# Patient Record
Sex: Female | Born: 1950 | Race: White | Hispanic: No | Marital: Married | Smoking: Never smoker
Health system: Southern US, Community
[De-identification: ages and names within clinical notes are randomized; demographics above are authoritative.]

## PROBLEM LIST (undated history)

## (undated) DIAGNOSIS — J45909 Unspecified asthma, uncomplicated: Secondary | ICD-10-CM

## (undated) DIAGNOSIS — Z87440 Personal history of urinary (tract) infections: Secondary | ICD-10-CM

## (undated) DIAGNOSIS — T884XXA Failed or difficult intubation, initial encounter: Secondary | ICD-10-CM

## (undated) DIAGNOSIS — Z87448 Personal history of other diseases of urinary system: Secondary | ICD-10-CM

## (undated) DIAGNOSIS — M1990 Unspecified osteoarthritis, unspecified site: Secondary | ICD-10-CM

## (undated) DIAGNOSIS — M797 Fibromyalgia: Secondary | ICD-10-CM

## (undated) DIAGNOSIS — R112 Nausea with vomiting, unspecified: Secondary | ICD-10-CM

## (undated) DIAGNOSIS — S36039A Unspecified laceration of spleen, initial encounter: Secondary | ICD-10-CM

## (undated) DIAGNOSIS — E079 Disorder of thyroid, unspecified: Secondary | ICD-10-CM

## (undated) DIAGNOSIS — C801 Malignant (primary) neoplasm, unspecified: Secondary | ICD-10-CM

## (undated) DIAGNOSIS — I10 Essential (primary) hypertension: Secondary | ICD-10-CM

## (undated) DIAGNOSIS — J40 Bronchitis, not specified as acute or chronic: Secondary | ICD-10-CM

## (undated) DIAGNOSIS — M549 Dorsalgia, unspecified: Secondary | ICD-10-CM

## (undated) DIAGNOSIS — Z87898 Personal history of other specified conditions: Secondary | ICD-10-CM

## (undated) DIAGNOSIS — J189 Pneumonia, unspecified organism: Secondary | ICD-10-CM

## (undated) DIAGNOSIS — Z9889 Other specified postprocedural states: Secondary | ICD-10-CM

## (undated) DIAGNOSIS — G4730 Sleep apnea, unspecified: Secondary | ICD-10-CM

## (undated) DIAGNOSIS — E119 Type 2 diabetes mellitus without complications: Secondary | ICD-10-CM

## (undated) DIAGNOSIS — T8859XA Other complications of anesthesia, initial encounter: Secondary | ICD-10-CM

## (undated) DIAGNOSIS — T4145XA Adverse effect of unspecified anesthetic, initial encounter: Secondary | ICD-10-CM

## (undated) HISTORY — PX: VAGINAL HYSTERECTOMY: SUR661

## (undated) HISTORY — PX: EYE SURGERY: SHX253

## (undated) HISTORY — PX: APPENDECTOMY: SHX54

## (undated) HISTORY — PX: BREAST SURGERY: SHX581

## (undated) HISTORY — PX: KNEE ARTHROSCOPY: SHX127

## (undated) HISTORY — PX: CHOLECYSTECTOMY: SHX55

## (undated) HISTORY — PX: TONSILLECTOMY: SUR1361

## (undated) HISTORY — PX: ABDOMINAL SURGERY: SHX537

## (undated) HISTORY — PX: BACK SURGERY: SHX140

---

## 1971-09-06 HISTORY — PX: FRACTURE SURGERY: SHX138

## 1998-02-26 ENCOUNTER — Ambulatory Visit (HOSPITAL_COMMUNITY): Admission: RE | Admit: 1998-02-26 | Discharge: 1998-02-26 | Payer: Self-pay | Admitting: Internal Medicine

## 1998-03-26 ENCOUNTER — Other Ambulatory Visit: Admission: RE | Admit: 1998-03-26 | Discharge: 1998-03-26 | Payer: Self-pay | Admitting: Internal Medicine

## 1998-10-07 ENCOUNTER — Ambulatory Visit (HOSPITAL_COMMUNITY): Admission: RE | Admit: 1998-10-07 | Discharge: 1998-10-07 | Payer: Self-pay | Admitting: Orthopedic Surgery

## 1998-10-07 ENCOUNTER — Encounter: Payer: Self-pay | Admitting: Orthopedic Surgery

## 1999-01-12 ENCOUNTER — Encounter: Payer: Self-pay | Admitting: Otolaryngology

## 1999-01-12 ENCOUNTER — Ambulatory Visit (HOSPITAL_COMMUNITY): Admission: RE | Admit: 1999-01-12 | Discharge: 1999-01-12 | Payer: Self-pay | Admitting: Otolaryngology

## 1999-01-28 ENCOUNTER — Ambulatory Visit (HOSPITAL_COMMUNITY): Admission: RE | Admit: 1999-01-28 | Discharge: 1999-01-28 | Payer: Self-pay | Admitting: Otolaryngology

## 1999-02-10 ENCOUNTER — Ambulatory Visit: Admission: RE | Admit: 1999-02-10 | Discharge: 1999-02-10 | Payer: Self-pay | Admitting: Otolaryngology

## 1999-03-03 ENCOUNTER — Encounter: Admission: RE | Admit: 1999-03-03 | Discharge: 1999-05-25 | Payer: Self-pay | Admitting: Anesthesiology

## 1999-03-08 ENCOUNTER — Encounter: Payer: Self-pay | Admitting: Otolaryngology

## 1999-03-08 ENCOUNTER — Ambulatory Visit (HOSPITAL_COMMUNITY): Admission: RE | Admit: 1999-03-08 | Discharge: 1999-03-08 | Payer: Self-pay | Admitting: Otolaryngology

## 1999-11-23 ENCOUNTER — Encounter: Admission: RE | Admit: 1999-11-23 | Discharge: 1999-11-23 | Payer: Self-pay | Admitting: Orthopedic Surgery

## 1999-11-23 ENCOUNTER — Encounter: Payer: Self-pay | Admitting: Orthopedic Surgery

## 2000-02-29 ENCOUNTER — Ambulatory Visit (HOSPITAL_COMMUNITY): Admission: RE | Admit: 2000-02-29 | Discharge: 2000-02-29 | Payer: Self-pay | Admitting: Neurosurgery

## 2000-02-29 ENCOUNTER — Encounter: Payer: Self-pay | Admitting: Neurosurgery

## 2000-04-06 ENCOUNTER — Other Ambulatory Visit: Admission: RE | Admit: 2000-04-06 | Discharge: 2000-04-06 | Payer: Self-pay | Admitting: *Deleted

## 2001-03-14 ENCOUNTER — Encounter: Admission: RE | Admit: 2001-03-14 | Discharge: 2001-03-14 | Payer: Self-pay | Admitting: Orthopedic Surgery

## 2001-03-14 ENCOUNTER — Encounter: Payer: Self-pay | Admitting: Orthopedic Surgery

## 2001-04-11 ENCOUNTER — Encounter: Admission: RE | Admit: 2001-04-11 | Discharge: 2001-04-11 | Payer: Self-pay | Admitting: Internal Medicine

## 2001-04-11 ENCOUNTER — Encounter: Payer: Self-pay | Admitting: Internal Medicine

## 2001-04-12 ENCOUNTER — Encounter: Payer: Self-pay | Admitting: Internal Medicine

## 2001-04-12 ENCOUNTER — Encounter: Admission: RE | Admit: 2001-04-12 | Discharge: 2001-04-12 | Payer: Self-pay | Admitting: Internal Medicine

## 2002-03-31 ENCOUNTER — Encounter: Payer: Self-pay | Admitting: Orthopedic Surgery

## 2002-03-31 ENCOUNTER — Encounter: Admission: RE | Admit: 2002-03-31 | Discharge: 2002-03-31 | Payer: Self-pay | Admitting: Orthopedic Surgery

## 2003-08-15 ENCOUNTER — Other Ambulatory Visit: Admission: RE | Admit: 2003-08-15 | Discharge: 2003-08-15 | Payer: Self-pay | Admitting: *Deleted

## 2004-10-27 ENCOUNTER — Ambulatory Visit (HOSPITAL_COMMUNITY): Admission: RE | Admit: 2004-10-27 | Discharge: 2004-10-28 | Payer: Self-pay | Admitting: Orthopedic Surgery

## 2010-08-21 ENCOUNTER — Inpatient Hospital Stay (HOSPITAL_COMMUNITY)
Admission: EM | Admit: 2010-08-21 | Discharge: 2010-08-28 | Payer: Self-pay | Source: Home / Self Care | Attending: Internal Medicine | Admitting: Internal Medicine

## 2010-10-27 ENCOUNTER — Encounter (INDEPENDENT_AMBULATORY_CARE_PROVIDER_SITE_OTHER): Payer: Self-pay | Admitting: *Deleted

## 2010-10-29 ENCOUNTER — Encounter (INDEPENDENT_AMBULATORY_CARE_PROVIDER_SITE_OTHER): Payer: Self-pay | Admitting: *Deleted

## 2010-10-29 ENCOUNTER — Encounter: Payer: Self-pay | Admitting: Internal Medicine

## 2010-11-02 NOTE — Letter (Signed)
Summary: Miralax Instructions  [facility]  [address]   [place], [place] [address]   Phone: [phone]  Fax: [phone]       [facility] Heslop    [birth year]    MRN: [id number]       Procedure Day Dorna Bloom: Friday, 11-12-10     Arrival Time: 7:30 a.m.     Procedure Time: 8:30 a.m.     Location of Procedure:                    x  [facility] (4th Floor)    PREPARATION FOR COLONOSCOPY WITH MIRALAX  Starting 5 days prior to your procedure 11-07-10 do not eat nuts, seeds, popcorn, corn, beans, peas,  salads, or any raw vegetables.  Do not take any fiber supplements (e.g. Metamucil, Citrucel, and Benefiber). ____________________________________________________________________________________________________   THE DAY BEFORE YOUR PROCEDURE         DATE: 11-11-10  DAY: Thursday  1   Drink clear liquids the entire day-NO SOLID FOOD  2   Do not drink anything colored red or purple.  Avoid juices with pulp.  No orange juice.  3   Drink at least 64 oz. (8 glasses) of fluid/clear liquids during the day to prevent dehydration and help the prep work efficiently.  CLEAR LIQUIDS INCLUDE: Water Jello Ice Popsicles Tea (sugar ok, no milk/cream) Powdered fruit flavored drinks Coffee (sugar ok, no milk/cream) Gatorade Juice: apple, white grape, white cranberry  Lemonade Clear bullion, consomm, broth Carbonated beverages (any kind) Strained chicken noodle soup Hard Candy  4   Mix the entire bottle of Miralax with 64 oz. of Gatorade/Powerade in the morning and put in the refrigerator to chill.  5   At 3:00 pm take 2 Dulcolax/Bisacodyl tablets.  6   At 4:30 pm take one Reglan/Metoclopramide tablet.  7  Starting at 5:00 pm drink one 8 oz glass of the Miralax mixture every 15-20 minutes until you have finished drinking the entire 64 oz.  You should finish drinking prep around 7:30 or 8:00 pm.  8   If you are nauseated, you may take the 2nd Reglan/Metoclopramide  tablet at 6:30 pm.        9    At 8:00 pm take 2 more DULCOLAX/Bisacodyl tablets.     THE DAY OF YOUR PROCEDURE      DATE:  11-12-10  DAY: Friday You may drink clear liquids until 6:30 a.m.  (2 HOURS BEFORE PROCEDURE).   MEDICATION INSTRUCTIONS  Unless otherwise instructed, you should take regular prescription medications with a small sip of water as early as possible the morning of your procedure.  Diabetic patients - see separate instructions.            OTHER INSTRUCTIONS  You will need a responsible adult at least 62 years of age to accompany you and drive you home.   This person must remain in the waiting room during your procedure.  Wear loose fitting clothing that is easily removed.  Leave jewelry and other valuables at home.  However, you may wish to bring a book to read or an iPod/MP3 player to listen to music as you wait for your procedure to start.  Remove all body piercing jewelry and leave at home.  Total time from sign-in until discharge is approximately 2-3 hours.  You should go home directly after your procedure and rest.  You can resume normal activities the day after your procedure.  The day of your  procedure you should not:   Drive   Make legal decisions   Operate machinery   Drink alcohol   Return to work  You will receive specific instructions about eating, activities and medications before you leave.   The above instructions have been reviewed and explained to me by   Ezra Sites RN  October 29, 2010 9:48 AM     I fully understand and can verbalize these instructions _____________________________ Date _______

## 2010-11-02 NOTE — Miscellaneous (Signed)
Summary: [facility]  Clinical Lists Changes  Medications: Added new medication of MIRALAX   POWD (POLYETHYLENE GLYCOL 3350) As per prep  instructions. - Signed Added new medication of DULCOLAX 5 MG  TBEC (BISACODYL) Day before procedure take 2 at 3pm and 2 at 8pm. - Signed Added new medication of REGLAN 10 MG  TABS (METOCLOPRAMIDE HCL) As per prep instructions. - Signed Rx of MIRALAX   POWD (POLYETHYLENE GLYCOL 3350) As per prep  instructions.;  #255gm x 0;  Signed;  Entered by: Ezra Sites RN;  Authorized by: Hart Carwin MD;  Method used: Electronically to [facility]  [address] [id number] [address] [place], [place]  [address], Ph: [phone] or [phone], Fax: [phone] Rx of DULCOLAX 5 MG  TBEC (BISACODYL) Day before procedure take 2 at 3pm and 2 at 8pm.;  #4 x 0;  Signed;  Entered by: Ezra Sites RN;  Authorized by: Hart Carwin MD;  Method used: Electronically to [facility]  [address] [facility] [address] [place], [place]  [address], Ph: [phone] or [phone], Fax: [phone] Rx of REGLAN 10 MG  TABS (METOCLOPRAMIDE HCL) As per prep instructions.;  #2 x 0;  Signed;  Entered by: Ezra Sites RN;  Authorized by: Hart Carwin MD;  Method used: Electronically to [facility]  [address] [facility] [address] [place], [place]  [address], Ph: [phone] or [phone], Fax: [phone] Allergies: Added new allergy or adverse reaction of * ANECTINE Added new allergy or adverse reaction of VICODIN Added new allergy or adverse reaction of * BEXTRA Added new allergy or adverse reaction of PERCOCET Observations: Added new observation of NKA: F (10/29/2010 9:08)    Prescriptions: REGLAN 10 MG  TABS (METOCLOPRAMIDE HCL) As per prep instructions.  #2 x 0   Entered by:   Ezra Sites RN   Authorized by:   Hart Carwin MD   Signed by:   Ezra Sites RN on 10/29/2010   Method used:   [facility]ronically to        [facility]  [address] [facility] ([facility]ail)       [address]       [place], [place]  [address]       Ph: [phone] or [phone]       Fax: [phone]   RxID:   [id number] DULCOLAX 5 MG  TBEC (BISACODYL) Day before procedure take 2 at 3pm and 2 at 8pm.  #4 x 0   Entered by:   Ezra Sites RN   Authorized by:   Hart Carwin MD   Signed by:   Ezra Sites RN on 10/29/2010   Method used:   [facility]ronically to        [facility]  [address] [facility] (retail)       [address]       [place], [place]  [address]       Ph: [phone] or [phone]       Fax: [phone]   RxID:   [id number] MIRALAX   POWD (POLYETHYLENE GLYCOL 3350) As per prep  instructions.  #255gm x 0   Entered by:   Ezra Sites RN   Authorized by:   Hart Carwin MD   Signed by:   Ezra Sites RN on 10/29/2010   Method used:   [facility]ronically to        [facility]  [address] [facility] (  [facility])       [address]       [place], [place]  [address]       Ph: [phone] or [phone]       Fax: [phone]   RxID:   [id number]

## 2010-11-02 NOTE — Letter (Signed)
Summary: Diabetic Instructions  [facility]  [address] [place], [place] [address]   Phone: [phone]  Fax: [phone]    [facility]pe [birth year] MRN: [id number]     ORAL DIABETIC MEDICATION INSTRUCTIONS                                     Glucotrol XL, Januvia, Metformin The day before your procedure:   Take your diabetic pill as you do normally  The day of your procedure:   Do not take your diabetic pill    We will check your blood sugar levels during the admission process and again in Recovery before discharging you home  ________________________________________________________________________

## 2010-11-12 ENCOUNTER — Other Ambulatory Visit: Payer: Self-pay | Admitting: Internal Medicine

## 2010-11-12 ENCOUNTER — Other Ambulatory Visit (AMBULATORY_SURGERY_CENTER): Payer: Medicare Other | Admitting: Internal Medicine

## 2010-11-12 DIAGNOSIS — R238 Other skin changes: Secondary | ICD-10-CM

## 2010-11-12 DIAGNOSIS — K5730 Diverticulosis of large intestine without perforation or abscess without bleeding: Secondary | ICD-10-CM

## 2010-11-12 DIAGNOSIS — Z1211 Encounter for screening for malignant neoplasm of colon: Secondary | ICD-10-CM

## 2010-11-12 DIAGNOSIS — D126 Benign neoplasm of colon, unspecified: Secondary | ICD-10-CM

## 2010-11-12 LAB — GLUCOSE, CAPILLARY: Glucose-Capillary: 166 mg/dL — ABNORMAL HIGH (ref 70–99)

## 2010-11-15 LAB — URINE MICROSCOPIC-ADD ON

## 2010-11-15 LAB — PHOSPHORUS: Phosphorus: 2.7 mg/dL (ref 2.3–4.6)

## 2010-11-15 LAB — GLUCOSE, CAPILLARY
Glucose-Capillary: 108 mg/dL — ABNORMAL HIGH (ref 70–99)
Glucose-Capillary: 108 mg/dL — ABNORMAL HIGH (ref 70–99)
Glucose-Capillary: 109 mg/dL — ABNORMAL HIGH (ref 70–99)
Glucose-Capillary: 111 mg/dL — ABNORMAL HIGH (ref 70–99)
Glucose-Capillary: 114 mg/dL — ABNORMAL HIGH (ref 70–99)
Glucose-Capillary: 114 mg/dL — ABNORMAL HIGH (ref 70–99)
Glucose-Capillary: 114 mg/dL — ABNORMAL HIGH (ref 70–99)
Glucose-Capillary: 117 mg/dL — ABNORMAL HIGH (ref 70–99)
Glucose-Capillary: 119 mg/dL — ABNORMAL HIGH (ref 70–99)
Glucose-Capillary: 122 mg/dL — ABNORMAL HIGH (ref 70–99)
Glucose-Capillary: 133 mg/dL — ABNORMAL HIGH (ref 70–99)
Glucose-Capillary: 138 mg/dL — ABNORMAL HIGH (ref 70–99)
Glucose-Capillary: 145 mg/dL — ABNORMAL HIGH (ref 70–99)
Glucose-Capillary: 154 mg/dL — ABNORMAL HIGH (ref 70–99)
Glucose-Capillary: 209 mg/dL — ABNORMAL HIGH (ref 70–99)
Glucose-Capillary: 81 mg/dL (ref 70–99)
Glucose-Capillary: 90 mg/dL (ref 70–99)

## 2010-11-15 LAB — BASIC METABOLIC PANEL
BUN: 9 mg/dL (ref 6–23)
CO2: 29 mEq/L (ref 19–32)
CO2: 29 mEq/L (ref 19–32)
Calcium: 8.7 mg/dL (ref 8.4–10.5)
Calcium: 9 mg/dL (ref 8.4–10.5)
Calcium: 9.1 mg/dL (ref 8.4–10.5)
Calcium: 9.4 mg/dL (ref 8.4–10.5)
Chloride: 98 mEq/L (ref 96–112)
Chloride: 98 mEq/L (ref 96–112)
Creatinine, Ser: 0.81 mg/dL (ref 0.4–1.2)
Creatinine, Ser: 0.88 mg/dL (ref 0.4–1.2)
Creatinine, Ser: 0.92 mg/dL (ref 0.4–1.2)
Creatinine, Ser: 0.93 mg/dL (ref 0.4–1.2)
Creatinine, Ser: 1.03 mg/dL (ref 0.4–1.2)
GFR calc Af Amer: >60 mL/min (ref >60)
GFR calc Af Amer: >60 mL/min (ref >60)
GFR calc Af Amer: >60 mL/min (ref >60)
GFR calc Af Amer: >60 mL/min (ref >60)
GFR calc Af Amer: >60 mL/min (ref >60)
GFR calc non Af Amer: 54 mL/min — ABNORMAL LOW (ref >60)
GFR calc non Af Amer: >60 mL/min (ref >60)
GFR calc non Af Amer: >60 mL/min (ref >60)
Potassium: 4.4 mEq/L (ref 3.5–5.1)
Potassium: 5.1 mEq/L (ref 3.5–5.1)
Sodium: 135 mEq/L (ref 135–145)
Sodium: 135 mEq/L (ref 135–145)

## 2010-11-15 LAB — LIPID PANEL
Cholesterol: 115 mg/dL (ref 0–200)
HDL: 53 mg/dL (ref >39)
LDL Cholesterol: 48 mg/dL (ref 0–99)
Total CHOL/HDL Ratio: 2.2 RATIO
Triglycerides: 71 mg/dL (ref <150)
VLDL: 14 mg/dL (ref 0–40)

## 2010-11-15 LAB — CBC
HCT: 35 % — ABNORMAL LOW (ref 36.0–46.0)
HCT: 36.5 % (ref 36.0–46.0)
Hemoglobin: 11 g/dL — ABNORMAL LOW (ref 12.0–15.0)
Hemoglobin: 12.3 g/dL (ref 12.0–15.0)
MCH: 30.6 pg (ref 26.0–34.0)
MCHC: 31.4 g/dL (ref 30.0–36.0)
MCHC: 31.4 g/dL (ref 30.0–36.0)
MCHC: 31.5 g/dL (ref 30.0–36.0)
MCV: 96.9 fL (ref 78.0–100.0)
Platelets: 178 10*3/uL (ref 150–400)
Platelets: 191 10*3/uL (ref 150–400)
Platelets: 208 10*3/uL (ref 150–400)
Platelets: 212 10*3/uL (ref 150–400)
RBC: 3.46 MIL/uL — ABNORMAL LOW (ref 3.87–5.11)
RBC: 3.58 MIL/uL — ABNORMAL LOW (ref 3.87–5.11)
RBC: 3.64 MIL/uL — ABNORMAL LOW (ref 3.87–5.11)
RBC: 3.91 MIL/uL (ref 3.87–5.11)
RBC: 3.92 MIL/uL (ref 3.87–5.11)
WBC: 11.7 10*3/uL — ABNORMAL HIGH (ref 4.0–10.5)
WBC: 14.9 10*3/uL — ABNORMAL HIGH (ref 4.0–10.5)
WBC: 15.9 10*3/uL — ABNORMAL HIGH (ref 4.0–10.5)
WBC: 6.3 10*3/uL (ref 4.0–10.5)
WBC: 6.7 10*3/uL (ref 4.0–10.5)

## 2010-11-15 LAB — WOUND CULTURE

## 2010-11-15 LAB — URINALYSIS, ROUTINE W REFLEX MICROSCOPIC
Glucose, UA: 100 mg/dL — AB
Ketones, ur: NEGATIVE mg/dL
Nitrite: NEGATIVE
Specific Gravity, Urine: 1.039 — ABNORMAL HIGH (ref 1.005–1.030)
pH: 5.5 (ref 5.0–8.0)

## 2010-11-15 LAB — DIFFERENTIAL
Basophils Absolute: 0 10*3/uL (ref 0.0–0.1)
Basophils Relative: 0 % (ref 0–1)
Eosinophils Absolute: 0.1 10*3/uL (ref 0.0–0.7)
Eosinophils Relative: 0 % (ref 0–5)
Lymphocytes Relative: 11 % — ABNORMAL LOW (ref 12–46)
Lymphs Abs: 1.8 10*3/uL (ref 0.7–4.0)
Monocytes Absolute: 1.5 10*3/uL — ABNORMAL HIGH (ref 0.1–1.0)
Monocytes Relative: 10 % (ref 3–12)
Neutro Abs: 12.5 10*3/uL — ABNORMAL HIGH (ref 1.7–7.7)
Neutrophils Relative %: 79 % — ABNORMAL HIGH (ref 43–77)

## 2010-11-15 LAB — COMPREHENSIVE METABOLIC PANEL
CO2: 26 mEq/L (ref 19–32)
Calcium: 8.6 mg/dL (ref 8.4–10.5)
Chloride: 96 mEq/L (ref 96–112)
Creatinine, Ser: 0.78 mg/dL (ref 0.4–1.2)
GFR calc non Af Amer: >60 mL/min (ref >60)
Glucose, Bld: 150 mg/dL — ABNORMAL HIGH (ref 70–99)
Total Bilirubin: 0.9 mg/dL (ref 0.3–1.2)

## 2010-11-15 LAB — HEMOGLOBIN A1C: Hgb A1c MFr Bld: 8.6 % — ABNORMAL HIGH (ref <5.7)

## 2010-11-15 LAB — VANCOMYCIN, RANDOM: Vancomycin Rm: 10.5 ug/mL

## 2010-11-15 LAB — MAGNESIUM: Magnesium: 1.6 mg/dL (ref 1.5–2.5)

## 2010-11-15 LAB — VANCOMYCIN, TROUGH: Vancomycin Tr: 32 ug/mL (ref 10.0–20.0)

## 2010-11-15 LAB — URINE CULTURE: Colony Count: NO GROWTH

## 2010-11-16 NOTE — Procedures (Addendum)
Summary: Colonoscopy  Patient: [name] Note: All result statuses are Final unless otherwise noted.  Tests: (1) Colonoscopy (COL)   COL Colonoscopy           DONE     [facility]     [address]     [place], [place]  [address]          COLONOSCOPY PROCEDURE REPORT          PATIENT:  Gina Bender, Gina Bender  MR#:  [id number]     BIRTHDATE:  Jul 03, 1951, 59 yrs. old  GENDER:  female     ENDOSCOPIST:  Hedwig Morton. Juanda Chance, MD     REF. BY:  Halford Chessman, M.D.     PROCEDURE DATE:  11/12/2010     PROCEDURE:  Colonoscopy 75[address]     ASA CLASS:  Class II     INDICATIONS:  Elevated Risk Screening father with colon     cancer/polyps     last colon 1996- hyperplastic polyp     MEDICATIONS:   Versed 5 mg, Fentanyl 50 mcg          DESCRIPTION OF PROCEDURE:   After the risks benefits and     alternatives of the procedure were thoroughly explained, informed     consent was obtained.  No rectal exam performed. The LB CF-[id number]     [id number] endoscope was introduced through the anus and advanced to     the cecum, which was identified by both the appendix and ileocecal     valve, without limitations.  The quality of the prep was Miralax     fair.  The instrument was then slowly withdrawn as the colon was     fully examined.     <<PROCEDUREIMAGES>>          FINDINGS:  A sessile polyp was found. at 20 cm, 5 mm polyp Polyp     was snared without cautery. Retrieval was successful (see image2     and image1). snare polyp  Mild diverticulosis was found in the     sigmoid colon (see image9).  This was otherwise a normal     examination of the colon (see image3, image4, image5, image6, and     image10).   Retroflexed views in the rectum revealed no     abnormalities.    The scope was then withdrawn from the patient     and the procedure completed.          COMPLICATIONS:  None     ENDOSCOPIC IMPRESSION:     1) Sessile polyp     2) Mild diverticulosis in the sigmoid colon     3)  Otherwise normal examination     RECOMMENDATIONS:     1) Await pathology results     2) High fiber diet.     REPEAT EXAM:  In 5 year(s) for.          ______________________________     Hedwig Morton. Juanda Chance, MD          CC:          n.     eSIGNED:   Hedwig Morton. Jonnae Fonseca at 11/12/2010 08:39 AM          Girtha Hake, [id number]  Note: An exclamation mark (!) indicates a result that was not dispersed into the flowsheet. Document Creation Date: 11/12/2010 8:39 AM _______________________________________________________________________  (1) Order result status: Final Collection or observation date-time: 11/12/2010 08:30 Requested date-time:  Receipt date-time:  Reported date-time:  Referring Physician:   Ordering Physician: Lina Sar [id number]) Specimen Source:  Source: Launa Grill Order Number: [id number] Lab site:   Appended Document: Colonoscopy     Procedures Next Due Date:    Colonoscopy: 11/2015

## 2010-11-20 ENCOUNTER — Encounter: Payer: Self-pay | Admitting: Internal Medicine

## 2010-11-23 NOTE — Letter (Addendum)
Summary: Patient Notice- Polyp Results  [facility]  [address] [place], [place] [address]   Phone: [phone]  Fax: [phone]        November 20, 2010 MRN: [id number]    [facility] [address] [place], [place]  [address]    Dear Gina Bender,  The Pathology report  shows the polyp to be a food particle.The polyp which was removed was not present in the specimen container.  I recommend you have a repeat colonoscopy examination in 5_ years to look for recurrent polyps, as having colon polyps increases your risk for having recurrent polyps or even colon cancer in the future.  Should you develop new or worsening symptoms of abdominal pain, bowel habit changes or bleeding from the rectum or bowels, please schedule an evaluation with either your primary care physician or with me.  Additional information/recommendations:  _x_ No further action with gastroenterology is needed at this time. Please      follow-up with your primary care physician for your other healthcare      needs.  __ Please call [phone] to schedule a return visit to review your      situation.  __ Please keep your follow-up visit as already scheduled.  __ Continue treatment plan as outlined the day of your exam.  Please call us if you are having persistent problems or have questions about your condition that have not been fully answered at this time.  Sincerely,  Hart Carwin MD  This letter has been electronically signed by your physician.  Appended Document: Patient Notice- Polyp Results letter mailed

## 2011-01-21 NOTE — Op Note (Signed)
[name], Gina Bender             ACCOUNT NO.:  [id number]   MEDICAL RECORD NO.:  [id number]          PATIENT TYPE:  OIB   LOCATION:  2550                         FACILITY:  [facility]   PHYSICIAN:  Loreta Ave, M.D. DATE OF BIRTH:  July 02, 1951   DATE OF PROCEDURE:  10/27/2004  DATE OF DISCHARGE:                                 OPERATIVE REPORT   PREOPERATIVE DIAGNOSES:  Right knee medial meniscal tear with  tricompartmental degenerative joint disease, morbid obesity.   POSTOPERATIVE DIAGNOSES:  Right knee medial meniscal tear with  tricompartmental degenerative joint disease, morbid obesity.  Degenerative  tearing medial and lateral meniscus as well as tricompartmental grade 3 and  4 degenerative joint disease, most marked in medial compartment.   OPERATION PERFORMED:  Right knee examination under anesthesia, arthroscopy,  debridement of medial and lateral meniscus.  Generalized chondroplasty with  removal of chondral loose bodies and partial synovectomy.   SURGEON:  Loreta Ave, M.D.   ASSISTANT:  Genene Churn. Denton Meek.   ANESTHESIA:  General.   BLOOD LOSS:  Minimal.   TOURNIQUET:  Not employed.   SPECIMENS:  None.   CULTURES:  None.   COMPLICATIONS:  None.   DRESSING:  Soft compressive.   DESCRIPTION OF PROCEDURE:  The patient was brought to the operating room and  after adequate anesthesia had been obtained, the right knee examined.  Very  difficult because of morbid obesity.  Basically full motion, varus  alignment, stable ligaments with tibiofemoral and patellofemoral crepitus.  Tourniquet and leg holder applied.  Leg prepped and draped in the usual  sterile fashion.  Three portals were created, one superolateral, one each  medial and lateral parapatellar.  Inflow catheter introduced.  Knee  distended.  Arthroscope introduced.  Knee inspected.  Patellofemoral joint  good tracking.  Grade 3 changes on the patella debrided.  Grade 3 and some  focal grade 4 on  the trochlea debrided.  Medially, bone on bone over half  the compartment.  Chondral surfaces smoothed off.  Medial meniscus complex  tearing circumferentially.  Saucerized out to a stable rim removing all  degenerative torn and fragments that were folded over on top of the  meniscus.  Leaving a little bit of the rim all the way around.  Cruciate  ligaments intact.  Laterally, only grade 2 changes but tearing of the entire  torn lateral meniscus which was removed, tapered into remaining meniscus.  Salvaging the posterior half.  At completion, all recess examined and all  loose  fragments removed.  All hypertrophic synovitis debrided.  Instruments and  fluid removed.  Portals of knee injected with Marcaine.  Portals closed with  4-0 nylon.  Sterile compressive dressing applied.  Anesthesia reversed.  Brought to recovery room.  Tolerated surgery well.  No complications.      DFM/MEDQ  D:  10/27/2004  T:  10/27/2004  Job:  [id number]

## 2011-08-27 ENCOUNTER — Emergency Department
Admission: EM | Admit: 2011-08-27 | Discharge: 2011-08-27 | Disposition: A | Discharge status: Home / Self Care | Payer: Self-pay | Source: Home / Self Care

## 2011-08-27 NOTE — ED Provider Notes (Signed)
History     CSN: [id number]  Arrival date & time      None     No chief complaint on file.   (Consider location/radiation/quality/duration/timing/severity/associated sxs/prior treatment) HPI  No past medical history on file.  No past surgical history on file.  No family history on file.  History  Substance Use Topics  . Smoking status: Not on file  . Smokeless tobacco: Not on file  . Alcohol Use: Not on file    OB History    No data available      Review of Systems  Allergies  Hydrocodone-acetaminophen; Oxycodone-acetaminophen; and Succinylcholine  Home Medications  No current outpatient prescriptions on file.  There were no vitals taken for this visit.  Physical Exam  ED Course  Procedures (including critical care time)  Labs Reviewed - No data to display No results found.   No diagnosis found.    MDM  This appointment was put in by Error.    Lily Kocher, MD 08/27/11 [phone]

## 2011-09-18 DIAGNOSIS — G4730 Sleep apnea, unspecified: Secondary | ICD-10-CM | POA: Insufficient documentation

## 2011-09-18 DIAGNOSIS — E039 Hypothyroidism, unspecified: Secondary | ICD-10-CM | POA: Insufficient documentation

## 2011-09-18 DIAGNOSIS — I10 Essential (primary) hypertension: Secondary | ICD-10-CM | POA: Insufficient documentation

## 2011-09-18 DIAGNOSIS — M545 Low back pain, unspecified: Secondary | ICD-10-CM | POA: Insufficient documentation

## 2013-06-03 DIAGNOSIS — E119 Type 2 diabetes mellitus without complications: Secondary | ICD-10-CM | POA: Insufficient documentation

## 2014-06-02 ENCOUNTER — Encounter: Payer: Self-pay | Admitting: Internal Medicine

## 2014-08-31 ENCOUNTER — Emergency Department (HOSPITAL_COMMUNITY)
Admission: EM | Admit: 2014-08-31 | Discharge: 2014-09-01 | Disposition: A | Discharge status: Home / Self Care | Payer: Medicare Other | Attending: Emergency Medicine | Admitting: Emergency Medicine

## 2014-08-31 ENCOUNTER — Encounter (HOSPITAL_COMMUNITY): Payer: Self-pay

## 2014-08-31 DIAGNOSIS — R531 Weakness: Secondary | ICD-10-CM | POA: Insufficient documentation

## 2014-08-31 DIAGNOSIS — M797 Fibromyalgia: Secondary | ICD-10-CM | POA: Diagnosis not present

## 2014-08-31 DIAGNOSIS — J45909 Unspecified asthma, uncomplicated: Secondary | ICD-10-CM | POA: Diagnosis not present

## 2014-08-31 DIAGNOSIS — E669 Obesity, unspecified: Secondary | ICD-10-CM | POA: Diagnosis not present

## 2014-08-31 DIAGNOSIS — Z853 Personal history of malignant neoplasm of breast: Secondary | ICD-10-CM | POA: Insufficient documentation

## 2014-08-31 DIAGNOSIS — R6883 Chills (without fever): Secondary | ICD-10-CM | POA: Diagnosis not present

## 2014-08-31 DIAGNOSIS — Z794 Long term (current) use of insulin: Secondary | ICD-10-CM | POA: Insufficient documentation

## 2014-08-31 DIAGNOSIS — R29898 Other symptoms and signs involving the musculoskeletal system: Secondary | ICD-10-CM

## 2014-08-31 DIAGNOSIS — E119 Type 2 diabetes mellitus without complications: Secondary | ICD-10-CM | POA: Diagnosis not present

## 2014-08-31 DIAGNOSIS — G8929 Other chronic pain: Secondary | ICD-10-CM | POA: Diagnosis not present

## 2014-08-31 DIAGNOSIS — M1990 Unspecified osteoarthritis, unspecified site: Secondary | ICD-10-CM | POA: Diagnosis not present

## 2014-08-31 DIAGNOSIS — S22080A Wedge compression fracture of T11-T12 vertebra, initial encounter for closed fracture: Secondary | ICD-10-CM

## 2014-08-31 DIAGNOSIS — D696 Thrombocytopenia, unspecified: Secondary | ICD-10-CM | POA: Diagnosis not present

## 2014-08-31 DIAGNOSIS — Z79899 Other long term (current) drug therapy: Secondary | ICD-10-CM | POA: Insufficient documentation

## 2014-08-31 DIAGNOSIS — M545 Low back pain: Secondary | ICD-10-CM

## 2014-08-31 DIAGNOSIS — M8448XA Pathological fracture, other site, initial encounter for fracture: Secondary | ICD-10-CM | POA: Diagnosis not present

## 2014-08-31 DIAGNOSIS — I10 Essential (primary) hypertension: Secondary | ICD-10-CM | POA: Insufficient documentation

## 2014-08-31 DIAGNOSIS — E079 Disorder of thyroid, unspecified: Secondary | ICD-10-CM | POA: Diagnosis not present

## 2014-08-31 HISTORY — DX: Disorder of thyroid, unspecified: E07.9

## 2014-08-31 HISTORY — DX: Dorsalgia, unspecified: M54.9

## 2014-08-31 HISTORY — DX: Unspecified osteoarthritis, unspecified site: M19.90

## 2014-08-31 HISTORY — DX: Type 2 diabetes mellitus without complications: E11.9

## 2014-08-31 HISTORY — DX: Unspecified asthma, uncomplicated: J45.909

## 2014-08-31 HISTORY — DX: Unspecified laceration of spleen, initial encounter: S36.039A

## 2014-08-31 HISTORY — DX: Fibromyalgia: M79.7

## 2014-08-31 HISTORY — DX: Essential (primary) hypertension: I10

## 2014-08-31 HISTORY — DX: Malignant (primary) neoplasm, unspecified: C80.1

## 2014-08-31 LAB — URINALYSIS, ROUTINE W REFLEX MICROSCOPIC
Glucose, UA: NEGATIVE mg/dL
HGB URINE DIPSTICK: NEGATIVE
Ketones, ur: NEGATIVE mg/dL
LEUKOCYTES UA: NEGATIVE
NITRITE: POSITIVE — AB
Protein, ur: NEGATIVE mg/dL
Specific Gravity, Urine: 1.024 (ref 1.005–1.030)
UROBILINOGEN UA: 1 mg/dL (ref 0.0–1.0)
pH: 6 (ref 5.0–8.0)

## 2014-08-31 LAB — CBC WITH DIFFERENTIAL/PLATELET
BASOS PCT: 0 % (ref 0–1)
Basophils Absolute: 0 10*3/uL (ref 0.0–0.1)
EOS ABS: 0.2 10*3/uL (ref 0.0–0.7)
Eosinophils Relative: 3 % (ref 0–5)
HCT: 37 % (ref 36.0–46.0)
Hemoglobin: 12.2 g/dL (ref 12.0–15.0)
Lymphocytes Relative: 24 % (ref 12–46)
Lymphs Abs: 1.7 10*3/uL (ref 0.7–4.0)
MCH: 30.7 pg (ref 26.0–34.0)
MCHC: 33 g/dL (ref 30.0–36.0)
MCV: 93.2 fL (ref 78.0–100.0)
Monocytes Absolute: 0.9 10*3/uL (ref 0.1–1.0)
Monocytes Relative: 13 % — ABNORMAL HIGH (ref 3–12)
NEUTROS ABS: 4.4 10*3/uL (ref 1.7–7.7)
NEUTROS PCT: 60 % (ref 43–77)
Platelets: 130 10*3/uL — ABNORMAL LOW (ref 150–400)
RBC: 3.97 MIL/uL (ref 3.87–5.11)
RDW: 14.6 % (ref 11.5–15.5)
WBC: 7.3 10*3/uL (ref 4.0–10.5)

## 2014-08-31 LAB — URINE MICROSCOPIC-ADD ON

## 2014-08-31 LAB — BASIC METABOLIC PANEL
Anion gap: 10 (ref 5–15)
BUN: 11 mg/dL (ref 6–23)
CO2: 23 mmol/L (ref 19–32)
Calcium: 8.5 mg/dL (ref 8.4–10.5)
Chloride: 101 mEq/L (ref 96–112)
Creatinine, Ser: 0.52 mg/dL (ref 0.50–1.10)
GFR calc non Af Amer: >90 mL/min (ref >90)
Glucose, Bld: 139 mg/dL — ABNORMAL HIGH (ref 70–99)
POTASSIUM: 3.5 mmol/L (ref 3.5–5.1)
SODIUM: 134 mmol/L — AB (ref 135–145)

## 2014-08-31 MED ORDER — ONDANSETRON HCL 4 MG/2ML IJ SOLN
4.0000 mg | Freq: Once | INTRAMUSCULAR | Status: AC
  Administered 2014-08-31: 4 mg via INTRAVENOUS
  Filled 2014-08-31: qty 2

## 2014-08-31 MED ORDER — HYDROMORPHONE HCL 1 MG/ML IJ SOLN
1.0000 mg | Freq: Once | INTRAMUSCULAR | Status: AC
  Administered 2014-08-31: 1 mg via INTRAVENOUS
  Filled 2014-08-31: qty 1

## 2014-08-31 MED ORDER — CEFTRIAXONE SODIUM 1 G IJ SOLR
1.0000 g | Freq: Once | INTRAMUSCULAR | Status: AC
Start: 2014-08-31 — End: 2014-09-01
  Administered 2014-08-31: 1 g via INTRAVENOUS
  Filled 2014-08-31: qty 10

## 2014-08-31 MED ORDER — LORAZEPAM 2 MG/ML IJ SOLN
1.0000 mg | Freq: Once | INTRAMUSCULAR | Status: AC
  Administered 2014-08-31: 1 mg via INTRAVENOUS
  Filled 2014-08-31: qty 1

## 2014-08-31 MED ORDER — HYDROMORPHONE HCL 1 MG/ML IJ SOLN
0.5000 mg | Freq: Once | INTRAMUSCULAR | Status: AC
Start: 2014-08-31 — End: 2014-08-31
  Administered 2014-08-31: 0.5 mg via INTRAVENOUS
  Filled 2014-08-31: qty 1

## 2014-08-31 NOTE — ED Provider Notes (Signed)
CSN: [id number]     Arrival date & time 08/31/14  1803 History   First MD Initiated Contact with Patient 08/31/14 2001     Chief Complaint  Patient presents with  . Back Pain     (Consider location/radiation/quality/duration/timing/severity/associated sxs/prior Treatment) HPI  Gina Bender is a(n) 63 y.o. female who presents with cc of back pain. She has a pmh of htn, DM, fobormyalgia, chronic back pain and previous back surgeries performed by Dr. Harl Bowie at [facility]. The patient states that on 12/24 she was reaching forward to grab a bag when she had sudden onset, severe low back pain which she descrbes as constant, progressively worsening,worse with movment, and lying flat, nothing makes it better, non radiating. She took advil without relief. Denies weakness, loss of bowel/bladder function or saddle anesthesia. Denies neck stiffness, headache, rash.  Denies fever or recent procedures to back. She denies urinary sxs. The patient had what she believes is a GI virus on 12/23 which included nausea, vomitng, and diarrhea that lasted approximately 24 hours. She ahd associated chills and myalgias, but her sxas seem to have resolved.     Past Medical History  Diagnosis Date  . Back pain   . Arthritis   . Asthma   . Diabetes mellitus without complication   . Hypertension   . Fibromyalgia   . Thyroid disease   . Cancer     Breast  . Splenic laceration    Past Surgical History  Procedure Laterality Date  . Back surgery    . Breast surgery    . Fracture surgery    . Appendectomy    . Cholecystectomy    . Abdominal surgery    . Abdominal hysterectomy    . Tonsillectomy     No family history on file. History  Substance Use Topics  . Smoking status: Never Smoker   . Smokeless tobacco: Not on file  . Alcohol Use: No   OB History    No data available     Review of Systems Ten systems reviewed and are negative for acute change, except as noted in the HPI.     Allergies   Hydrocodone-acetaminophen; Oxycodone-acetaminophen; and Succinylcholine  Home Medications   Prior to Admission medications   Medication Sig Start Date End Date Taking? Authorizing Provider  atenolol (TENORMIN) 50 MG tablet Take 50 mg by mouth daily. 06/20/14  Yes Historical Provider, MD  cloNIDine (CATAPRES) 0.1 MG tablet Take 0.1 mg by mouth 2 (two) times daily. 06/20/14  Yes Historical Provider, MD  etodolac (LODINE) 500 MG tablet Take 500 mg by mouth at bedtime. 06/20/14  Yes Historical Provider, MD  gabapentin (NEURONTIN) 300 MG capsule Take 300 mg by mouth 2 (two) times daily.   Yes Historical Provider, MD  HUMALOG MIX 75/25 KWIKPEN (75-25) 100 UNIT/ML Kwikpen Inject 28 Units as directed daily. 07/18/14  Yes Historical Provider, MD  levothyroxine (SYNTHROID, LEVOTHROID) 175 MCG tablet Take 175 mcg by mouth daily. 06/20/14  Yes Historical Provider, MD  losartan (COZAAR) 100 MG tablet Take 100 mg by mouth daily. 06/20/14  Yes Historical Provider, MD  metFORMIN (GLUCOPHAGE) 1000 MG tablet Take 1,000 mg by mouth 2 (two) times daily. 06/20/14  Yes Historical Provider, MD  PARoxetine (PAXIL) 40 MG tablet Take 40 mg by mouth daily. 06/20/14  Yes Historical Provider, MD   BP 149/94 mmHg  Pulse 74  Temp(Src) 98.3 F (36.8 C) (Oral)  Resp 16  Ht 5\' 8"  (1.727 m)  Wt 295 lb (  133.811 kg)  BMI 44.86 kg/m2  SpO2 94% Physical Exam  Constitutional: She is oriented to person, place, and time. She appears well-developed and well-nourished. No distress.  Obese, appears uncomfortable  HENT:  Head: Normocephalic and atraumatic.  Eyes: Conjunctivae are normal. No scleral icterus.  Neck: Normal range of motion.  Cardiovascular: Normal rate, regular rhythm and normal heart sounds.  Exam reveals no gallop and no friction rub.   No murmur heard. Pulmonary/Chest: Effort normal and breath sounds normal. No respiratory distress.  Abdominal: Soft. Bowel sounds are normal. She exhibits no distension and no  mass. There is no tenderness. There is no guarding.  Neurological: She is alert and oriented to person, place, and time.  Hyporeflexive, significant weakness in the R leg with dorsiflexion, plantar flexion, hip flexion.  The patient   Skin: Skin is warm and dry. She is not diaphoretic.  Nursing note and vitals reviewed.   ED Course  Procedures (including critical care time) Labs Review Labs Reviewed  CBC WITH DIFFERENTIAL - Abnormal; Notable for the following:    Platelets 130 (*)    Monocytes Relative 13 (*)    All other components within normal limits  BASIC METABOLIC PANEL - Abnormal; Notable for the following:    Sodium 134 (*)    Glucose, Bld 139 (*)    All other components within normal limits  URINALYSIS, ROUTINE W REFLEX MICROSCOPIC    Imaging Review No results found.   EKG Interpretation None      MDM   Final diagnoses:  Right leg weakness    Patient here for back pain. Sig weakness in the R leg as compared to the left without other focal neuro deficits. Will obtain MRI to r/o cord compression.  11:54 PM BP 140/61 mmHg  Pulse 80  Temp(Src) 98.3 F (36.8 C) (Oral)  Resp 16  Ht 5\' 8"  (1.727 m)  Wt 295 lb (133.811 kg)  BMI 44.86 kg/m2  SpO2 93%  patient with UTI. MRI pending. Given IV rocephin. Ativan/ pain meds for MRI.    12:36 AM  Awaiting MRI results.  patient given in hand off to NP Tysinger.  Plan- if able to ambulate and no emergent surgical concerns, d/c with abx, pain meds and neurosurgery referral. Follow up with pcp regarding thrombocytopenia.   Margarita Mail, PA-C 09/01/14 0037  Threasa Beards, MD 09/01/14 [phone]

## 2014-08-31 NOTE — ED Notes (Signed)
Pt reports hx of back pain.  Pt reports back pain that started Thursday.  Pt states it seemed as if it would get better.  Pt states pain has worsened today to the point she can barely stand it.

## 2014-09-01 ENCOUNTER — Emergency Department (HOSPITAL_COMMUNITY): Payer: Medicare Other

## 2014-09-01 MED ORDER — CEPHALEXIN 500 MG PO CAPS: 500.0000 mg | ORAL_CAPSULE | Freq: Four times a day (QID) | ORAL | Status: DC

## 2014-09-01 MED ORDER — HYDROMORPHONE HCL 1 MG/ML IJ SOLN
1.0000 mg | Freq: Once | INTRAMUSCULAR | Status: AC
  Administered 2014-09-01: 1 mg via INTRAVENOUS
  Filled 2014-09-01: qty 1

## 2014-09-01 MED ORDER — ONDANSETRON HCL 4 MG PO TABS: 4.0000 mg | ORAL_TABLET | Freq: Four times a day (QID) | ORAL | Status: DC

## 2014-09-01 MED ORDER — OXYCODONE HCL 5 MG PO TABS: 5.0000 mg | ORAL_TABLET | ORAL | Status: DC | PRN

## 2014-09-01 NOTE — ED Notes (Signed)
Pt returned to bed. Pt being assisted with changing by family. Family asked to inform staff if assistance is needed.

## 2014-09-01 NOTE — ED Provider Notes (Signed)
12:30 AM: Received hand-off report from Margarita Mail, PA-C.  Plan includes reviewing MRI once it results and consulting Neuro if concerning findings.  Otherwise will D/C with referral to neuro.  Pt currently in MRI.  1:30 AM: Pt resting without distress, awaiting MRI results.  2:30 AM: MRI results reviewed. Discussed findings with pt. Including compression fractures with height loss of T12.  Pt still with pain but able to bear weight to stand and neuro deficit distally. Will consult neurosurgery.  3:30 AM: IV pain meds given  4:30 AM: Consulted with Dr. Kathyrn Sheriff (neurosurgery).  Recommends TLSO brace and pain meds and refer to be seen in office.  Discussed with pt. will discharge once brace is available with prescription for pain meds and antibiotic for UTI. Pt is well-appearing, in no acute distress and vital signs are stable.  They appear safe to be discharged.  Return precautions provided. Pt aware of plan and in agreement.       Britt Bottom, NP 09/01/14 [place], MD 09/04/14 [phone]

## 2014-09-01 NOTE — ED Notes (Signed)
Pt returned to room. Pt insists on waiting in wheelchair in her room for her son to return so that she can change into her new clothes before returning to bed. RN informed of pts request.

## 2014-09-01 NOTE — ED Notes (Signed)
Pt brought to bathroom by wheelchair. Pt stood and transitioned herself to the toilet. Pt given pull string/callbell. Pt informed to pull string when she needs assistance.

## 2014-09-01 NOTE — Discharge Instructions (Signed)
Please follow the directions provided.  Be sure to call the neurosurgeon's office in the am to arrange a follow-up appointment.  Use the pain medicines as directed.  You may use the nausea medicine as needed.  Use the antibiotics as directed to treat your urinary tract infection. Wear your brace as directed. Don't hesitate to return for any new, worsening or concerning symptoms.    SEEK IMMEDIATE MEDICAL CARE IF:  You have increasing pain, vomiting, or are unable to move around at all.  You develop numbness, tingling, weakness, or paralysis of any part of your body.  You develop a loss of normal bowel or bladder control.  You have difficulty breathing, cough, fever, chest or abdominal pain.

## 2014-09-18 ENCOUNTER — Other Ambulatory Visit (HOSPITAL_COMMUNITY): Payer: Self-pay | Admitting: Neurosurgery

## 2014-10-09 ENCOUNTER — Encounter (HOSPITAL_COMMUNITY): Payer: Self-pay | Admitting: *Deleted

## 2014-10-09 MED ORDER — LACTATED RINGERS IV SOLN
INTRAVENOUS | Status: DC
  Administered 2014-10-10: 08:00:00 via INTRAVENOUS

## 2014-10-09 MED ORDER — MUPIROCIN 2 % EX OINT: 1.0000 "application " | TOPICAL_OINTMENT | Freq: Once | CUTANEOUS | Status: DC

## 2014-10-09 MED ORDER — DEXTROSE 5 % IV SOLN
3.0000 g | INTRAVENOUS | Status: AC
  Administered 2014-10-10: 3 g via INTRAVENOUS
  Filled 2014-10-09: qty 3000

## 2014-10-09 NOTE — Anesthesia Preprocedure Evaluation (Addendum)
Anesthesia Evaluation  Patient identified by MRN, date of birth, ID band Patient awake    Reviewed: Allergy & Precautions, NPO status , Patient's Chart, lab work & pertinent test results  History of Anesthesia Complications (+) PONV, DIFFICULT AIRWAY and history of anesthetic complications (possible problem with succinylcholine)  Airway Mallampati: III  TM Distance: >3 FB Neck ROM: Full    Dental  (+) Chipped, Missing, Poor Dentition, Dental Advisory Given   Pulmonary asthma , sleep apnea (no CPAP) , COPD COPD inhaler,  breath sounds clear to auscultation        Cardiovascular hypertension, Pt. on medications - anginaRhythm:Regular Rate:Normal     Neuro/Psych Compression fracture Chronic back pain    GI/Hepatic negative GI ROS, Neg liver ROS,   Endo/Other  diabetes (glu 75), Oral Hypoglycemic Agents, Insulin DependentMorbid obesity  Renal/GU negative Renal ROS     Musculoskeletal  (+) Arthritis -, Fibromyalgia -  Abdominal (+) + obese,   Peds  Hematology negative hematology ROS (+)   Anesthesia Other Findings Breast cancer: surgery, XRT, chemo  Reproductive/Obstetrics                           Anesthesia Physical Anesthesia Plan  ASA: III  Anesthesia Plan: General   Post-op Pain Management:    Induction: Intravenous  Airway Management Planned: Video Laryngoscope Planned  Additional Equipment:   Intra-op Plan:   Post-operative Plan: Extubation in OR  Informed Consent: I have reviewed the patients History and Physical, chart, labs and discussed the procedure including the risks, benefits and alternatives for the proposed anesthesia with the patient or authorized representative who has indicated his/her understanding and acceptance.   Dental advisory given  Plan Discussed with: CRNA and Surgeon  Anesthesia Plan Comments: (See my note. Myra Gianotti, PA-C)        Anesthesia Quick Evaluation

## 2014-10-09 NOTE — Progress Notes (Signed)
Anesthesia Chart Review: SAME DAY WORK-UP.  Patient is a 64 year old female scheduled for T12 kyphoplasty on 10/10/13 by Dr. Kathyrn Sheriff.  History includes non-smoker, DM2, HTN, breast cancer s/p surgery (not otherwise specified),  fibromyalgia, hypothyroidism, splenic laceration, asthma, OSA, back surgery ~ 2003 [facility]; Dr. Harl Bowie), TMJ surgery '91, tonsillectomy, cholecystectomy, appendectomy, hysterectomy. BMI is consistent with morbid obesity. PCP is Angelica Pou, PA-C with [facility], records pending.  Anesthesia history by patient report to the phone interviewing RN included 1) POST-OP N/V 2) DIFFICULT INTUBATION 3) "DIED ON THE TABLE" reportedly from succinylcholine reaction during TMJ surgery in 1991.  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  - Bag/Mask ventilation was difficult (2 person with OA). - Airway was secured asleep, oral intubation using Glidescope to insert a 7.5 ETT. One attempt, direct visualization.  -Recommendations: Induction with short-acting agent and alternative techniques readily available. - Allergy to Anectine: Use low dose Zermuron, propofol.  Meds include ASA 81 (on hold), atenolol, clonidine, etodaloac, gabapentin, Humalog, levothyroxine, losartan, metformin, Ditropan, albuterol.   Anesthesia records from ~ 2003 [facility]) requested, but are still pending.    She will need labs and EKG per protocol on arrival.  Results to be reviewed by her anesthesiologist. Definitive anesthesia plan on the day of surgery.  George Hugh [facility] Short Stay Center/Anesthesiology Phone [phone] 10/09/2014 2:18 PM

## 2014-10-09 NOTE — Progress Notes (Signed)
After speaking with pt on the phone, pt informed this nurse that she "died on the table" during TMJ surgery in 1991 after receiving Succinylcholine which is listed in pt's allergies. Attempted to obtain most recent office visit from [facility], pt sees Angelica Pou, NP [phone]) but office was closed for lunch until 2pm. Chart given to [place], [place] for further review.

## 2014-10-10 ENCOUNTER — Encounter (HOSPITAL_COMMUNITY): Payer: Self-pay | Admitting: Anesthesiology

## 2014-10-10 ENCOUNTER — Ambulatory Visit (HOSPITAL_COMMUNITY)
Admission: RE | Admit: 2014-10-10 | Discharge: 2014-10-10 | Disposition: A | Discharge status: Home / Self Care | Payer: Medicare Other | Source: Ambulatory Visit | Attending: Neurosurgery | Admitting: Neurosurgery

## 2014-10-10 ENCOUNTER — Inpatient Hospital Stay (HOSPITAL_COMMUNITY): Payer: Medicare Other

## 2014-10-10 ENCOUNTER — Inpatient Hospital Stay (HOSPITAL_COMMUNITY): Payer: Medicare Other | Admitting: Vascular Surgery

## 2014-10-10 ENCOUNTER — Encounter (HOSPITAL_COMMUNITY)
Admission: RE | Disposition: A | Discharge status: Home / Self Care | Payer: Self-pay | Source: Ambulatory Visit | Attending: Neurosurgery

## 2014-10-10 DIAGNOSIS — E119 Type 2 diabetes mellitus without complications: Secondary | ICD-10-CM | POA: Diagnosis not present

## 2014-10-10 DIAGNOSIS — J45909 Unspecified asthma, uncomplicated: Secondary | ICD-10-CM | POA: Diagnosis not present

## 2014-10-10 DIAGNOSIS — Z794 Long term (current) use of insulin: Secondary | ICD-10-CM | POA: Insufficient documentation

## 2014-10-10 DIAGNOSIS — E039 Hypothyroidism, unspecified: Secondary | ICD-10-CM | POA: Insufficient documentation

## 2014-10-10 DIAGNOSIS — M47896 Other spondylosis, lumbar region: Secondary | ICD-10-CM | POA: Diagnosis not present

## 2014-10-10 DIAGNOSIS — Z79899 Other long term (current) drug therapy: Secondary | ICD-10-CM | POA: Diagnosis not present

## 2014-10-10 DIAGNOSIS — Z9221 Personal history of antineoplastic chemotherapy: Secondary | ICD-10-CM | POA: Insufficient documentation

## 2014-10-10 DIAGNOSIS — G8929 Other chronic pain: Secondary | ICD-10-CM | POA: Diagnosis not present

## 2014-10-10 DIAGNOSIS — J449 Chronic obstructive pulmonary disease, unspecified: Secondary | ICD-10-CM | POA: Diagnosis not present

## 2014-10-10 DIAGNOSIS — M4856XA Collapsed vertebra, not elsewhere classified, lumbar region, initial encounter for fracture: Secondary | ICD-10-CM | POA: Diagnosis not present

## 2014-10-10 DIAGNOSIS — Z6841 Body Mass Index (BMI) 40.0 and over, adult: Secondary | ICD-10-CM | POA: Diagnosis not present

## 2014-10-10 DIAGNOSIS — Z923 Personal history of irradiation: Secondary | ICD-10-CM | POA: Diagnosis not present

## 2014-10-10 DIAGNOSIS — G4733 Obstructive sleep apnea (adult) (pediatric): Secondary | ICD-10-CM | POA: Diagnosis not present

## 2014-10-10 DIAGNOSIS — M549 Dorsalgia, unspecified: Secondary | ICD-10-CM | POA: Diagnosis present

## 2014-10-10 DIAGNOSIS — Z9889 Other specified postprocedural states: Secondary | ICD-10-CM | POA: Diagnosis not present

## 2014-10-10 DIAGNOSIS — M797 Fibromyalgia: Secondary | ICD-10-CM | POA: Diagnosis not present

## 2014-10-10 DIAGNOSIS — Z419 Encounter for procedure for purposes other than remedying health state, unspecified: Secondary | ICD-10-CM

## 2014-10-10 DIAGNOSIS — Z853 Personal history of malignant neoplasm of breast: Secondary | ICD-10-CM | POA: Diagnosis not present

## 2014-10-10 DIAGNOSIS — M1990 Unspecified osteoarthritis, unspecified site: Secondary | ICD-10-CM | POA: Diagnosis not present

## 2014-10-10 DIAGNOSIS — M4854XA Collapsed vertebra, not elsewhere classified, thoracic region, initial encounter for fracture: Secondary | ICD-10-CM | POA: Insufficient documentation

## 2014-10-10 DIAGNOSIS — I10 Essential (primary) hypertension: Secondary | ICD-10-CM | POA: Insufficient documentation

## 2014-10-10 DIAGNOSIS — Z7982 Long term (current) use of aspirin: Secondary | ICD-10-CM | POA: Insufficient documentation

## 2014-10-10 HISTORY — DX: Other complications of anesthesia, initial encounter: T88.59XA

## 2014-10-10 HISTORY — DX: Adverse effect of unspecified anesthetic, initial encounter: T41.45XA

## 2014-10-10 HISTORY — DX: Personal history of urinary (tract) infections: Z87.440

## 2014-10-10 HISTORY — DX: Other specified postprocedural states: Z98.890

## 2014-10-10 HISTORY — DX: Failed or difficult intubation, initial encounter: T88.4XXA

## 2014-10-10 HISTORY — DX: Personal history of other specified conditions: Z87.898

## 2014-10-10 HISTORY — DX: Bronchitis, not specified as acute or chronic: J40

## 2014-10-10 HISTORY — DX: Other specified postprocedural states: R11.2

## 2014-10-10 HISTORY — DX: Pneumonia, unspecified organism: J18.9

## 2014-10-10 HISTORY — DX: Personal history of other diseases of urinary system: Z87.448

## 2014-10-10 HISTORY — PX: KYPHOPLASTY: SHX5884

## 2014-10-10 HISTORY — DX: Sleep apnea, unspecified: G47.30

## 2014-10-10 LAB — BASIC METABOLIC PANEL
ANION GAP: 9 (ref 5–15)
BUN: 18 mg/dL (ref 6–23)
CHLORIDE: 104 mmol/L (ref 96–112)
CO2: 25 mmol/L (ref 19–32)
Calcium: 9.7 mg/dL (ref 8.4–10.5)
Creatinine, Ser: 0.82 mg/dL (ref 0.50–1.10)
GFR calc Af Amer: 85 mL/min — ABNORMAL LOW (ref >90)
GFR calc non Af Amer: 73 mL/min — ABNORMAL LOW (ref >90)
GLUCOSE: 78 mg/dL (ref 70–99)
POTASSIUM: 4 mmol/L (ref 3.5–5.1)
Sodium: 138 mmol/L (ref 135–145)

## 2014-10-10 LAB — GLUCOSE, CAPILLARY
GLUCOSE-CAPILLARY: 75 mg/dL (ref 70–99)
Glucose-Capillary: 81 mg/dL (ref 70–99)
Glucose-Capillary: 105 mg/dL — ABNORMAL HIGH (ref 70–99)
Glucose-Capillary: 76 mg/dL (ref 70–99)

## 2014-10-10 LAB — CBC
HEMATOCRIT: 39.5 % (ref 36.0–46.0)
Hemoglobin: 13.3 g/dL (ref 12.0–15.0)
MCH: 31.8 pg (ref 26.0–34.0)
MCHC: 33.7 g/dL (ref 30.0–36.0)
MCV: 94.5 fL (ref 78.0–100.0)
Platelets: 172 10*3/uL (ref 150–400)
RBC: 4.18 MIL/uL (ref 3.87–5.11)
RDW: 14.6 % (ref 11.5–15.5)
WBC: 7.4 10*3/uL (ref 4.0–10.5)

## 2014-10-10 LAB — SURGICAL PCR SCREEN
MRSA, PCR: NEGATIVE
Staphylococcus aureus: POSITIVE — AB

## 2014-10-10 SURGERY — KYPHOPLASTY
Anesthesia: General | Site: Spine Thoracic | Laterality: Bilateral

## 2014-10-10 MED ORDER — FENTANYL CITRATE 0.05 MG/ML IJ SOLN
INTRAMUSCULAR | Status: AC
  Filled 2014-10-10: qty 5

## 2014-10-10 MED ORDER — DEXAMETHASONE SODIUM PHOSPHATE 4 MG/ML IJ SOLN
INTRAMUSCULAR | Status: DC | PRN
  Administered 2014-10-10: 8 mg via INTRAVENOUS

## 2014-10-10 MED ORDER — MIDAZOLAM HCL 2 MG/2ML IJ SOLN: 0.5000 mg | Freq: Once | INTRAMUSCULAR | Status: DC | PRN

## 2014-10-10 MED ORDER — LACTATED RINGERS IV SOLN
INTRAVENOUS | Status: DC | PRN
  Administered 2014-10-10 (×2): via INTRAVENOUS

## 2014-10-10 MED ORDER — IOHEXOL 300 MG/ML  SOLN
INTRAMUSCULAR | Status: DC | PRN
  Administered 2014-10-10: 50 mL

## 2014-10-10 MED ORDER — PROPOFOL 10 MG/ML IV BOLUS
INTRAVENOUS | Status: DC | PRN
  Administered 2014-10-10: 150 mg via INTRAVENOUS

## 2014-10-10 MED ORDER — FENTANYL CITRATE 0.05 MG/ML IJ SOLN
INTRAMUSCULAR | Status: DC | PRN
  Administered 2014-10-10: 50 ug via INTRAVENOUS
  Administered 2014-10-10 (×2): 100 ug via INTRAVENOUS

## 2014-10-10 MED ORDER — MIDAZOLAM HCL 2 MG/2ML IJ SOLN
INTRAMUSCULAR | Status: AC
  Filled 2014-10-10: qty 2

## 2014-10-10 MED ORDER — ONDANSETRON HCL 4 MG/2ML IJ SOLN
INTRAMUSCULAR | Status: DC | PRN
  Administered 2014-10-10: 4 mg via INTRAVENOUS

## 2014-10-10 MED ORDER — LIDOCAINE HCL (CARDIAC) 20 MG/ML IV SOLN
INTRAVENOUS | Status: DC | PRN
  Administered 2014-10-10: 20 mg via INTRAVENOUS

## 2014-10-10 MED ORDER — NEOSTIGMINE METHYLSULFATE 10 MG/10ML IV SOLN
INTRAVENOUS | Status: DC | PRN
  Administered 2014-10-10: 3 mg via INTRAVENOUS

## 2014-10-10 MED ORDER — HYDROMORPHONE HCL 1 MG/ML IJ SOLN
0.2500 mg | INTRAMUSCULAR | Status: DC | PRN
  Administered 2014-10-10: 0.5 mg via INTRAVENOUS

## 2014-10-10 MED ORDER — 0.9 % SODIUM CHLORIDE (POUR BTL) OPTIME
TOPICAL | Status: DC | PRN
  Administered 2014-10-10: 1000 mL

## 2014-10-10 MED ORDER — MIDAZOLAM HCL 5 MG/5ML IJ SOLN
INTRAMUSCULAR | Status: DC | PRN
  Administered 2014-10-10: 2 mg via INTRAVENOUS

## 2014-10-10 MED ORDER — MEPERIDINE HCL 25 MG/ML IJ SOLN: 6.2500 mg | INTRAMUSCULAR | Status: DC | PRN

## 2014-10-10 MED ORDER — HYDROMORPHONE HCL 1 MG/ML IJ SOLN
INTRAMUSCULAR | Status: AC
  Filled 2014-10-10: qty 1

## 2014-10-10 MED ORDER — PROMETHAZINE HCL 25 MG/ML IJ SOLN: 6.2500 mg | INTRAMUSCULAR | Status: DC | PRN

## 2014-10-10 MED ORDER — BUPIVACAINE HCL (PF) 0.5 % IJ SOLN
INTRAMUSCULAR | Status: DC | PRN
  Administered 2014-10-10: 5 mL

## 2014-10-10 MED ORDER — GLYCOPYRROLATE 0.2 MG/ML IJ SOLN
INTRAMUSCULAR | Status: DC | PRN
  Administered 2014-10-10: 0.4 mg via INTRAVENOUS

## 2014-10-10 MED ORDER — LIDOCAINE-EPINEPHRINE 1 %-1:100000 IJ SOLN
INTRAMUSCULAR | Status: DC | PRN
  Administered 2014-10-10: 5 mL

## 2014-10-10 MED ORDER — MUPIROCIN 2 % EX OINT
TOPICAL_OINTMENT | CUTANEOUS | Status: AC
  Filled 2014-10-10: qty 22

## 2014-10-10 MED ORDER — ROCURONIUM BROMIDE 100 MG/10ML IV SOLN
INTRAVENOUS | Status: DC | PRN
  Administered 2014-10-10: 35 mg via INTRAVENOUS

## 2014-10-10 SURGICAL SUPPLY — 40 items
BLADE CLIPPER SURG (BLADE) IMPLANT
BLADE SURG 15 STRL LF DISP TIS (BLADE) ×1 IMPLANT
BLADE SURG 15 STRL SS (BLADE) ×3
CEMENT BONE KYPHX HV R (Orthopedic Implant) ×3 IMPLANT
CEMENT KYPHON C01A KIT/MIXER (Cement) ×2 IMPLANT
CONT SPEC 4OZ CLIKSEAL STRL BL (MISCELLANEOUS) ×6 IMPLANT
DEVICE BIOPSY BONE KYPHX (INSTRUMENTS) ×2 IMPLANT
DRAPE C-ARM 42X72 X-RAY (DRAPES) ×3 IMPLANT
DRAPE LAPAROTOMY 100X72X124 (DRAPES) ×3 IMPLANT
DRAPE PROXIMA HALF (DRAPES) ×3 IMPLANT
DRAPE SURG 17X23 STRL (DRAPES) ×1 IMPLANT
DRSG TELFA 3X8 NADH (GAUZE/BANDAGES/DRESSINGS) ×3 IMPLANT
DURAPREP 26ML APPLICATOR (WOUND CARE) ×3 IMPLANT
GAUZE SPONGE 4X4 16PLY XRAY LF (GAUZE/BANDAGES/DRESSINGS) ×3 IMPLANT
GLOVE BIOGEL PI IND STRL 7.5 (GLOVE) ×2 IMPLANT
GLOVE BIOGEL PI INDICATOR 7.5 (GLOVE) ×4
GLOVE ECLIPSE 7.0 STRL STRAW (GLOVE) ×3 IMPLANT
GLOVE EXAM NITRILE LRG STRL (GLOVE) IMPLANT
GLOVE EXAM NITRILE MD LF STRL (GLOVE) IMPLANT
GLOVE EXAM NITRILE XL STR (GLOVE) IMPLANT
GLOVE EXAM NITRILE XS STR PU (GLOVE) IMPLANT
GLOVE SURG SS PI 7.0 STRL IVOR (GLOVE) ×2 IMPLANT
GOWN STRL REUS W/ TWL LRG LVL3 (GOWN DISPOSABLE) ×2 IMPLANT
GOWN STRL REUS W/ TWL XL LVL3 (GOWN DISPOSABLE) IMPLANT
GOWN STRL REUS W/TWL 2XL LVL3 (GOWN DISPOSABLE) IMPLANT
GOWN STRL REUS W/TWL LRG LVL3 (GOWN DISPOSABLE) ×3
GOWN STRL REUS W/TWL XL LVL3 (GOWN DISPOSABLE) ×3
KIT BASIN OR (CUSTOM PROCEDURE TRAY) ×3 IMPLANT
KIT ROOM TURNOVER OR (KITS) ×3 IMPLANT
LIQUID BAND (GAUZE/BANDAGES/DRESSINGS) ×3 IMPLANT
NEEDLE HYPO 25X1 1.5 SAFETY (NEEDLE) ×3 IMPLANT
NS IRRIG 1000ML POUR BTL (IV SOLUTION) ×3 IMPLANT
PACK SURGICAL SETUP 50X90 (CUSTOM PROCEDURE TRAY) ×3 IMPLANT
PAD ARMBOARD 7.5X6 YLW CONV (MISCELLANEOUS) ×13 IMPLANT
SPECIMEN JAR SMALL (MISCELLANEOUS) IMPLANT
SUT VICRYL 3-0 RB1 18 ABS (SUTURE) ×3 IMPLANT
SYR CONTROL 10ML LL (SYRINGE) ×3 IMPLANT
TOWEL OR 17X24 6PK STRL BLUE (TOWEL DISPOSABLE) ×3 IMPLANT
TOWEL OR 17X26 10 PK STRL BLUE (TOWEL DISPOSABLE) ×3 IMPLANT
TRAY KYPHOPAK 15/3 ONESTEP 1ST (MISCELLANEOUS) ×2 IMPLANT

## 2014-10-10 NOTE — Progress Notes (Signed)
Informed Dr Glennon Mac that pt took  1/2 of her insulin last night but did take her metformin this morning.  CBG 81 on arrival and at 0800 is 75.  Pt denies symptoms.

## 2014-10-10 NOTE — Discharge Instructions (Signed)
What to eat: ° °For your first meals, you should eat lightly; only small meals initially.  If you do not have nausea, you may eat larger meals.  Avoid spicy, greasy and heavy food.   ° °General Anesthesia, Adult, Care After  °Refer to this sheet in the next few weeks. These instructions provide you with information on caring for yourself after your procedure. Your health care provider may also give you more specific instructions. Your treatment has been planned according to current medical practices, but problems sometimes occur. Call your health care provider if you have any problems or questions after your procedure.  °WHAT TO EXPECT AFTER THE PROCEDURE  °After the procedure, it is typical to experience:  °Sleepiness.  °Nausea and vomiting. °HOME CARE INSTRUCTIONS  °For the first 24 hours after general anesthesia:  °Have a responsible person with you.  °Do not drive a car. If you are alone, do not take public transportation.  °Do not drink alcohol.  °Do not take medicine that has not been prescribed by your health care provider.  °Do not sign important papers or make important decisions.  °You may resume a normal diet and activities as directed by your health care provider.  °Change bandages (dressings) as directed.  °If you have questions or problems that seem related to general anesthesia, call the hospital and ask for the anesthetist or anesthesiologist on call. °SEEK MEDICAL CARE IF:  °You have nausea and vomiting that continue the day after anesthesia.  °You develop a rash. °SEEK IMMEDIATE MEDICAL CARE IF:  °You have difficulty breathing.  °You have chest pain.  °You have any allergic problems. °Document Released: 11/28/2000 Document Revised: 04/24/2013 Document Reviewed: 03/07/2013  °ExitCare® Patient Information ©2014 [employer].  ° °Sore Throat  ° ° °A sore throat is a painful, burning, sore, or scratchy feeling of the throat. There may be pain or tenderness when swallowing or talking. You may have  other symptoms with a sore throat. These include coughing, sneezing, fever, or a swollen neck. A sore throat is often the first sign of another sickness. These sicknesses may include a cold, flu, strep throat, or an infection called mono. Most sore throats go away without medical treatment.  °HOME CARE  °Only take medicine as told by your doctor.  °Drink enough fluids to keep your pee (urine) clear or pale yellow.  °Rest as needed.  °Try using throat sprays, lozenges, or suck on hard candy (if older than 4 years or as told).  °Sip warm liquids, such as broth, herbal tea, or warm water with honey. Try sucking on frozen ice pops or drinking cold liquids.  °Rinse the mouth (gargle) with salt water. Mix 1 teaspoon salt with 8 ounces of water.  °Do not smoke. Avoid being around others when they are smoking.  °Put a humidifier in your bedroom at night to moisten the air. You can also turn on a hot shower and sit in the bathroom for 5-10 minutes. Be sure the bathroom door is closed. °GET HELP RIGHT AWAY IF:  °You have trouble breathing.  °You cannot swallow fluids, soft foods, or your spit (saliva).  °You have more puffiness (swelling) in the throat.  °Your sore throat does not get better in 7 days.  °You feel sick to your stomach (nauseous) and throw up (vomit).  °You have a fever or lasting symptoms for more than 2-3 days.  °You have a fever and your symptoms suddenly get worse. °MAKE SURE YOU:  °Understand these   instructions.  °Will watch your condition.  °Will get help right away if you are not doing well or get worse. °Document Released: 05/31/2008 Document Revised: 05/16/2012 Document Reviewed: 04/29/2012  °ExitCare® Patient Information ©2015 [employer]. This information is not intended to replace advice given to you by your health care provider. Make sure you discuss any questions you have with your health care provider.  ° ° °Laceration Care, Adult  ° ° °A laceration is a cut that goes through all layers of the  skin. The cut goes into the tissue beneath the skin.  °HOME CARE  °For stitches (sutures) or staples:  °Keep the cut clean and dry.  °If you have a bandage (dressing), change it at least once a day. Change the bandage if it gets wet or dirty, or as told by your doctor.  °Wash the cut with soap and water 2 times a day. Rinse the cut with water. Pat it dry with a clean towel.  °Put a thin layer of medicated cream on the cut as told by your doctor.  °You may shower after the first 24 hours. Do not soak the cut in water until the stitches are removed.  °Only take medicines as told by your doctor.  °Have your stitches or staples removed as told by your doctor. °For skin adhesive strips:  °Keep the cut clean and dry.  °Do not get the strips wet. You may take a bath, but be careful to keep the cut dry.  °If the cut gets wet, pat it dry with a clean towel.  °The strips will fall off on their own. Do not remove the strips that are still stuck to the cut. °For wound glue:  °You may shower or take baths. Do not soak or scrub the cut. Do not swim. Avoid heavy sweating until the glue falls off on its own. After a shower or bath, pat the cut dry with a clean towel.  °Do not put medicine on your cut until the glue falls off.  °If you have a bandage, do not put tape over the glue.  °Avoid lots of sunlight or tanning lamps until the glue falls off. Put sunscreen on the cut for the first year to reduce your scar.  °The glue will fall off on its own. Do not pick at the glue. °You may need a tetanus shot if:  °You cannot remember when you had your last tetanus shot.  °You have never had a tetanus shot. °If you need a tetanus shot and you choose not to have one, you may get tetanus. Sickness from tetanus can be serious.  °GET HELP RIGHT AWAY IF:  °Your pain does not get better with medicine.  °Your arm, hand, leg, or foot loses feeling (numbness) or changes color.  °Your cut is bleeding.  °Your joint feels weak, or you cannot use your  joint.  °You have painful lumps on your body.  °Your cut is red, puffy (swollen), or painful.  °You have a red line on the skin near the cut.  °You have yellowish-white fluid (pus) coming from the cut.  °You have a fever.  °You have a bad smell coming from the cut or bandage.  °Your cut breaks open before or after stitches are removed.  °You notice something coming out of the cut, such as wood or glass.  °You cannot move a finger or toe. °MAKE SURE YOU:  °Understand these instructions.  °Will watch your condition.  °Will get help   right away if you are not doing well or get worse. °Document Released: 02/08/2008 Document Revised: 11/14/2011 Document Reviewed: 02/15/2011  °ExitCare® Patient Information ©2014 [employer].  ° ° °

## 2014-10-10 NOTE — Transfer of Care (Signed)
Immediate Anesthesia Transfer of Care Note  Patient: Gina Bender  Procedure(s) Performed: Procedure(s): KYPHOPLASTY, Thoracic Twelve (Bilateral)  Patient Location: PACU  Anesthesia Type:General  Level of Consciousness: awake, alert , oriented and patient cooperative  Airway & Oxygen Therapy: Patient Spontanous Breathing and Patient connected to nasal cannula oxygen  Post-op Assessment: Report given to RN, Post -op Vital signs reviewed and stable and Patient moving all extremities  Post vital signs: Reviewed and stable  Last Vitals:  Filed Vitals:   10/10/14 1119  BP: 156/75  Pulse:   Temp: 36.4 C  Resp:     Complications: No apparent anesthesia complications
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
## 2014-10-10 NOTE — Progress Notes (Signed)
Pt rates pain 7/10 when aroused. Having to remind pt to breathe. No s/s of pain.  Will let pt cont to rest. And cont to monitor pain and s/s of pain

## 2014-10-10 NOTE — Op Note (Signed)
PREOP DIAGNOSIS: T12 compression fx   POSTOP DIAGNOSIS: L1 compression fracture (pt harbors lumbarized S1)  PROCEDURE: 1. L1 Kyphoplasty  SURGEON: Dr. Consuella Lose, MD  ASSISTANT: None  ANESTHESIA: GETA  EBL: Minimal  SPECIMENS: L1 vertebral body  DRAINS: None  COMPLICATIONS: None immediate  CONDITION: Hemodynamically stable to PCAU  HISTORY: Gina Bender is a 64 y.o. yo female initially seen in the outpatient clinic with back pain and found to have a compression fracture. A trial of conservative measures was unsuccessful, and she elected to proceed with kyphoplasty. The risks and benefits were reviewed with the patient and after all questions were answered, informed consent was obtained.  PROCEDURE IN DETAIL: After informed consent was obtained and witnessed, the patient was brought to the operating room. After induction of anesthesia, the patient was positioned on the operative table in the prone position. All pressure points were meticulously padded. Fluoroscopy was used to mark out the projection of the L1 pedicles on the skin. Skin incision was then marked out and prepped and draped in the usual sterile fashion.  After time out was conducted, bilateral stab incisions were made and Jamshidi needles were introduced. Under AP and lateral fluoroscopic guidance, bilateral L1 pedicles were canulated. Bilateral bone biopsies were taken and sent for permanent pathology. Drill was then used to create a channel and the kyphoplasty balloon was placed and expanded to create a cavity. Approximately 5cc of PMMA cement was injected in each side under fluoroscopy, taking care to preserve the posterior cortex. The balloons were then removed, replaced with the trocars, and the Jamshidi needles removed.  Stab incisions were then closed with 3-0 vicryl suture and standard skin glue. The patient was then transferred to the stretcher and taken to the PACU in stable hemodynamic  condition.  At the end of the case all sponge, needle, and instrument counts were correct.

## 2014-10-10 NOTE — Anesthesia Postprocedure Evaluation (Signed)
  Anesthesia Post-op Note  Patient: Gina Bender  Procedure(s) Performed: Procedure(s): KYPHOPLASTY, Thoracic Twelve (Bilateral)  Patient Location: PACU  Anesthesia Type:General  Level of Consciousness: awake, alert , oriented and patient cooperative  Airway and Oxygen Therapy: Patient Spontanous Breathing  Post-op Pain: mild  Post-op Assessment: Post-op Vital signs reviewed, Patient's Cardiovascular Status Stable, Respiratory Function Stable, Patent Airway, No signs of Nausea or vomiting and Pain level controlled  Post-op Vital Signs: Reviewed and stable  Last Vitals:  Filed Vitals:   10/10/14 1215  BP:   Pulse: 75  Temp:   Resp: 18    Complications: No apparent anesthesia complications

## 2014-10-13 ENCOUNTER — Encounter (HOSPITAL_COMMUNITY): Payer: Self-pay | Admitting: Neurosurgery

## 2014-10-21 NOTE — Discharge Summary (Addendum)
Physician Discharge Summary  Patient ID: Gina Bender MRN: [id number] DOB/AGE: [birth year] 64 y.o.  Admit date: 10/10/2014 Discharge date: 10/10/2014  Admission Diagnoses: L1 compression fracture  Discharge Diagnoses: Same Active Problems:   * No active hospital problems. *   Discharged Condition: Stable  Hospital Course:  Gina Bender is a 64 y.o. female who presented for elective kyphoplasty for an L1 compression fracture. The procedure was done without complication, and she was discharged from recovery at her baseline condition.  Treatments: Surgery - L1 kyphoplasty  Discharge Exam: Blood pressure 133/74, pulse 79, temperature 97.1 F (36.2 C), temperature source Oral, resp. rate 15, height 5' 8.5" (1.74 m), weight 132.45 kg (292 lb), SpO2 94 %. Awake, alert, oriented Speech fluent, appropriate CN grossly intact 5/5 BUE/BLE Wound c/d/i  Follow-up: Follow-up in my office [facility] [phone]) in 2-4 weeks  Disposition: 01-Home or Self Care     Medication List    STOP taking these medications        cephALEXin 500 MG capsule  Commonly known as:  KEFLEX     ondansetron 4 MG tablet  Commonly known as:  ZOFRAN      TAKE these medications        aspirin EC 81 MG tablet  Take 81 mg by mouth daily.     atenolol 50 MG tablet  Commonly known as:  TENORMIN  Take 50 mg by mouth daily.     calcium-vitamin D 500-200 MG-UNIT per tablet  Commonly known as:  OSCAL WITH D  Take 2 tablets by mouth daily with breakfast.     cloNIDine 0.1 MG tablet  Commonly known as:  CATAPRES  Take 0.1 mg by mouth 2 (two) times daily.     etodolac 500 MG tablet  Commonly known as:  LODINE  Take 500 mg by mouth at bedtime.     gabapentin 300 MG capsule  Commonly known as:  NEURONTIN  Take 300 mg by mouth 2 (two) times daily.     HUMALOG MIX 75/25 KWIKPEN (75-25) 100 UNIT/ML Kwikpen  Generic drug:  Insulin Lispro Prot & Lispro  Inject 28  Units as directed daily.     levothyroxine 175 MCG tablet  Commonly known as:  SYNTHROID, LEVOTHROID  Take 175 mcg by mouth daily.     losartan 100 MG tablet  Commonly known as:  COZAAR  Take 100 mg by mouth daily.     metFORMIN 1000 MG tablet  Commonly known as:  GLUCOPHAGE  Take 1,000 mg by mouth 2 (two) times daily.     multivitamin with minerals tablet  Take 1 tablet by mouth daily.     OMEGA 3 PO  Take 1 tablet by mouth daily.     oxybutynin 5 MG tablet  Commonly known as:  DITROPAN  Take 5-10 mg by mouth 2 (two) times daily.     oxyCODONE 5 MG immediate release tablet  Commonly known as:  Oxy IR/ROXICODONE  Take 1 tablet (5 mg total) by mouth every 4 (four) hours as needed for severe pain.     PARoxetine 40 MG tablet  Commonly known as:  PAXIL  Take 40 mg by mouth daily.     VENTOLIN HFA 108 (90 BASE) MCG/ACT inhaler  Generic drug:  albuterol  Inhale 2 puffs into the lungs every 6 (six) hours as needed. Shortness of breath         Signed: Zacchaeus Halm, C 10/21/2014, 10:10 AM

## 2014-11-27 ENCOUNTER — Ambulatory Visit (INDEPENDENT_AMBULATORY_CARE_PROVIDER_SITE_OTHER): Payer: Medicare Other | Admitting: Internal Medicine

## 2014-11-27 ENCOUNTER — Other Ambulatory Visit: Payer: Self-pay | Admitting: *Deleted

## 2014-11-27 ENCOUNTER — Encounter: Payer: Self-pay | Admitting: Internal Medicine

## 2014-11-27 DIAGNOSIS — E668 Other obesity: Secondary | ICD-10-CM | POA: Insufficient documentation

## 2014-11-27 DIAGNOSIS — E119 Type 2 diabetes mellitus without complications: Secondary | ICD-10-CM | POA: Diagnosis not present

## 2014-11-27 NOTE — Progress Notes (Signed)
Pre visit review using our clinic review tool, if applicable. No additional management support is needed unless otherwise documented below in the visit note. 

## 2014-11-27 NOTE — Patient Instructions (Signed)
Please see your eye doctor yearly to check for diabetic eye damage  Schedule your mammogram.   Please check your hemoglobin A1c every 3 months   You need to lose weight.  Consider a lower calorie diet and regular exercise.  Limit your sodium (Salt) intake  Follow-up neurosurgery as scheduled
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
## 2014-12-01 ENCOUNTER — Other Ambulatory Visit: Payer: Self-pay | Admitting: *Deleted

## 2014-12-01 ENCOUNTER — Telehealth: Payer: Self-pay | Admitting: Internal Medicine

## 2014-12-01 MED ORDER — ATENOLOL 50 MG PO TABS: 50.0000 mg | ORAL_TABLET | Freq: Every day | ORAL | Status: DC

## 2014-12-01 MED ORDER — INSULIN LISPRO PROT & LISPRO (75-25 MIX) 100 UNIT/ML KWIKPEN: 28.0000 [IU] | PEN_INJECTOR | Freq: Every day | SUBCUTANEOUS | Status: DC

## 2014-12-01 MED ORDER — METHOCARBAMOL 500 MG PO TABS
500.0000 mg | ORAL_TABLET | ORAL | Status: DC | PRN
Start: 2014-12-01 — End: 2014-12-04

## 2014-12-01 MED ORDER — METFORMIN HCL 1000 MG PO TABS: 1000.0000 mg | ORAL_TABLET | Freq: Two times a day (BID) | ORAL | Status: DC

## 2014-12-01 MED ORDER — VENTOLIN HFA 108 (90 BASE) MCG/ACT IN AERS: 2.0000 | INHALATION_SPRAY | Freq: Four times a day (QID) | RESPIRATORY_TRACT | Status: DC | PRN

## 2014-12-01 MED ORDER — INSULIN LISPRO PROT & LISPRO (75-25 MIX) 100 UNIT/ML KWIKPEN: PEN_INJECTOR | SUBCUTANEOUS | Status: DC

## 2014-12-01 MED ORDER — CLONIDINE HCL 0.1 MG PO TABS: 0.1000 mg | ORAL_TABLET | Freq: Two times a day (BID) | ORAL | Status: DC

## 2014-12-01 MED ORDER — LEVOTHYROXINE SODIUM 175 MCG PO TABS: 175.0000 ug | ORAL_TABLET | Freq: Every day | ORAL | Status: DC

## 2014-12-01 MED ORDER — GABAPENTIN 300 MG PO CAPS: 300.0000 mg | ORAL_CAPSULE | Freq: Two times a day (BID) | ORAL | Status: DC

## 2014-12-01 MED ORDER — LOSARTAN POTASSIUM 100 MG PO TABS: 100.0000 mg | ORAL_TABLET | Freq: Every day | ORAL | Status: DC

## 2014-12-01 MED ORDER — PAROXETINE HCL 40 MG PO TABS: 40.0000 mg | ORAL_TABLET | Freq: Every day | ORAL | Status: DC

## 2014-12-01 MED ORDER — ETODOLAC 500 MG PO TABS: 500.0000 mg | ORAL_TABLET | Freq: Every day | ORAL | Status: DC

## 2014-12-01 MED ORDER — OXYBUTYNIN CHLORIDE 5 MG PO TABS: 5.0000 mg | ORAL_TABLET | Freq: Two times a day (BID) | ORAL | Status: DC

## 2014-12-01 NOTE — Telephone Encounter (Signed)
Left message on voicemail to call office.  

## 2014-12-01 NOTE — Telephone Encounter (Signed)
Pt states the Insulin Lispro Prot & Lispro (HUMALOG MIX 75/25 KWIKPEN) (75-25) 100 UNIT/ML Kwikpen  Instructions are supposed to states  26 unit TWO X /day.  Also pharm is: [facility] [phone]

## 2014-12-01 NOTE — Telephone Encounter (Signed)
Pt called back, she said she gets her Insulin from [facility] and he told her to do  26 units twice a day of Humalog. Told pt okay will resend Rx to pharmacy as requested. Pt verbalized understanding. Rx sent.

## 2014-12-03 ENCOUNTER — Telehealth: Payer: Self-pay | Admitting: Internal Medicine

## 2014-12-03 NOTE — Telephone Encounter (Signed)
PA for methocarbamol was denied.  Patient's plan prefers celecoxib, meloxicam, naproxen, ibuprofen, tizanidine, and etodolac. Patient must try and fail all of the plan's preferred formulary alternatives.

## 2014-12-04 MED ORDER — TIZANIDINE HCL 2 MG PO TABS: 2.0000 mg | ORAL_TABLET | Freq: Three times a day (TID) | ORAL | Status: DC | PRN

## 2014-12-04 NOTE — Telephone Encounter (Signed)
Dr. Raliegh Ip, please advise what muscle relaxer you would like to order for pt. Methocarbamal was denied. See list below.

## 2014-12-04 NOTE — Telephone Encounter (Signed)
Per our conversation, sending message back.

## 2014-12-04 NOTE — Telephone Encounter (Signed)
Rx sent to pharmacy for Tizanidine 2 mg to replace Methocarbamol.

## 2014-12-04 NOTE — Telephone Encounter (Signed)
Tizanidine 2 mg  #60 one TID prn

## 2014-12-04 NOTE — Telephone Encounter (Signed)
Gina Bender, pt is already taking Etodolac. Methocarbamol is a muscle relaxer and Etodolac is anti-inflammatory they are two different medications. Please see if you can get PA approved. Thanks

## 2014-12-08 ENCOUNTER — Ambulatory Visit: Payer: Medicare Other | Admitting: Internal Medicine

## 2014-12-08 ENCOUNTER — Telehealth: Payer: Self-pay | Admitting: Internal Medicine

## 2014-12-08 DIAGNOSIS — M545 Low back pain: Secondary | ICD-10-CM

## 2014-12-08 NOTE — Telephone Encounter (Signed)
Okay to refer? 

## 2014-12-08 NOTE — Telephone Encounter (Signed)
Spoke to pt told her order for referral to Neurosurgeon Dr. Trenton Gammon was done and someone will contact you regarding an appt. Pt verbalized understanding.

## 2014-12-08 NOTE — Telephone Encounter (Signed)
Please see message and advise 

## 2014-12-08 NOTE — Telephone Encounter (Signed)
Pt was seen on 3-24 and would like to proceed with 2nd opinion on back. Can we refer? Pt has medicare

## 2014-12-08 NOTE — Telephone Encounter (Signed)
Pt would like to see dr Trenton Gammon

## 2014-12-09 DIAGNOSIS — Z853 Personal history of malignant neoplasm of breast: Secondary | ICD-10-CM | POA: Diagnosis not present

## 2014-12-09 DIAGNOSIS — N63 Unspecified lump in breast: Secondary | ICD-10-CM | POA: Diagnosis not present

## 2014-12-09 LAB — HM MAMMOGRAPHY: HM Mammogram: NEGATIVE

## 2014-12-12 ENCOUNTER — Encounter: Payer: Self-pay | Admitting: Internal Medicine

## 2014-12-24 DIAGNOSIS — M4806 Spinal stenosis, lumbar region: Secondary | ICD-10-CM | POA: Diagnosis not present

## 2014-12-29 ENCOUNTER — Encounter: Payer: Self-pay | Admitting: Internal Medicine

## 2015-01-01 DIAGNOSIS — H26491 Other secondary cataract, right eye: Secondary | ICD-10-CM | POA: Diagnosis not present

## 2015-01-01 DIAGNOSIS — Z961 Presence of intraocular lens: Secondary | ICD-10-CM | POA: Diagnosis not present

## 2015-01-01 DIAGNOSIS — H25012 Cortical age-related cataract, left eye: Secondary | ICD-10-CM | POA: Diagnosis not present

## 2015-01-01 DIAGNOSIS — H2512 Age-related nuclear cataract, left eye: Secondary | ICD-10-CM | POA: Diagnosis not present

## 2015-01-01 DIAGNOSIS — H25042 Posterior subcapsular polar age-related cataract, left eye: Secondary | ICD-10-CM | POA: Diagnosis not present

## 2015-01-01 LAB — HM DIABETES EYE EXAM

## 2015-01-08 ENCOUNTER — Encounter: Payer: Self-pay | Admitting: Internal Medicine

## 2015-01-08 DIAGNOSIS — M4806 Spinal stenosis, lumbar region: Secondary | ICD-10-CM | POA: Diagnosis not present

## 2015-01-08 DIAGNOSIS — M47816 Spondylosis without myelopathy or radiculopathy, lumbar region: Secondary | ICD-10-CM | POA: Diagnosis not present

## 2015-01-13 ENCOUNTER — Ambulatory Visit: Payer: Medicare Other | Admitting: Internal Medicine

## 2015-01-22 ENCOUNTER — Telehealth: Payer: Self-pay | Admitting: Internal Medicine

## 2015-01-22 ENCOUNTER — Encounter: Payer: Self-pay | Admitting: Internal Medicine

## 2015-01-22 ENCOUNTER — Ambulatory Visit (INDEPENDENT_AMBULATORY_CARE_PROVIDER_SITE_OTHER): Payer: Medicare Other | Admitting: Internal Medicine

## 2015-01-22 VITALS — BP 140/80 | HR 62 | Temp 97.9°F | Resp 20 | Ht 64.0 in | Wt 271.0 lb

## 2015-01-22 DIAGNOSIS — E039 Hypothyroidism, unspecified: Secondary | ICD-10-CM

## 2015-01-22 DIAGNOSIS — G4730 Sleep apnea, unspecified: Secondary | ICD-10-CM | POA: Diagnosis not present

## 2015-01-22 DIAGNOSIS — I10 Essential (primary) hypertension: Secondary | ICD-10-CM

## 2015-01-22 DIAGNOSIS — Z23 Encounter for immunization: Secondary | ICD-10-CM

## 2015-01-22 DIAGNOSIS — E119 Type 2 diabetes mellitus without complications: Secondary | ICD-10-CM

## 2015-01-22 LAB — COMPREHENSIVE METABOLIC PANEL
ALBUMIN: 3.7 g/dL (ref 3.5–5.2)
ALK PHOS: 85 U/L (ref 39–117)
ALT: 32 U/L (ref 0–35)
AST: 42 U/L — ABNORMAL HIGH (ref 0–37)
BILIRUBIN TOTAL: 1 mg/dL (ref 0.2–1.2)
BUN: 21 mg/dL (ref 6–23)
CO2: 24 mEq/L (ref 19–32)
Calcium: 10 mg/dL (ref 8.4–10.5)
Chloride: 104 mEq/L (ref 96–112)
Creatinine, Ser: 0.72 mg/dL (ref 0.40–1.20)
GFR: 86.25 mL/min (ref >60.00)
GLUCOSE: 64 mg/dL — AB (ref 70–99)
Potassium: 3.9 mEq/L (ref 3.5–5.1)
Sodium: 137 mEq/L (ref 135–145)
Total Protein: 7.1 g/dL (ref 6.0–8.3)

## 2015-01-22 LAB — MICROALBUMIN / CREATININE URINE RATIO
Creatinine,U: 212.3 mg/dL
Microalb Creat Ratio: 1.1 mg/g (ref 0.0–30.0)
Microalb, Ur: 2.3 mg/dL — ABNORMAL HIGH (ref 0.0–1.9)

## 2015-01-22 LAB — CBC WITH DIFFERENTIAL/PLATELET
BASOS PCT: 0.4 % (ref 0.0–3.0)
Basophils Absolute: 0 10*3/uL (ref 0.0–0.1)
EOS ABS: 0.1 10*3/uL (ref 0.0–0.7)
EOS PCT: 2 % (ref 0.0–5.0)
HEMATOCRIT: 37.7 % (ref 36.0–46.0)
Hemoglobin: 12.7 g/dL (ref 12.0–15.0)
LYMPHS ABS: 2.3 10*3/uL (ref 0.7–4.0)
Lymphocytes Relative: 34.7 % (ref 12.0–46.0)
MCHC: 33.6 g/dL (ref 30.0–36.0)
MCV: 95.2 fl (ref 78.0–100.0)
Monocytes Absolute: 0.7 10*3/uL (ref 0.1–1.0)
Monocytes Relative: 10.3 % (ref 3.0–12.0)
Neutro Abs: 3.5 10*3/uL (ref 1.4–7.7)
Neutrophils Relative %: 52.6 % (ref 43.0–77.0)
PLATELETS: 196 10*3/uL (ref 150.0–400.0)
RBC: 3.96 Mil/uL (ref 3.87–5.11)
RDW: 15.7 % — ABNORMAL HIGH (ref 11.5–15.5)
WBC: 6.7 10*3/uL (ref 4.0–10.5)

## 2015-01-22 LAB — TSH: TSH: 1.43 u[IU]/mL (ref 0.35–4.50)

## 2015-01-22 LAB — LIPID PANEL
CHOLESTEROL: 130 mg/dL (ref 0–200)
HDL: 58 mg/dL (ref >39.00)
LDL CALC: 58 mg/dL (ref 0–99)
NonHDL: 72
TRIGLYCERIDES: 68 mg/dL (ref 0.0–149.0)
Total CHOL/HDL Ratio: 2
VLDL: 13.6 mg/dL (ref 0.0–40.0)

## 2015-01-22 LAB — HEMOGLOBIN A1C: HEMOGLOBIN A1C: 5.5 % (ref 4.6–6.5)

## 2015-01-22 MED ORDER — INSULIN LISPRO PROT & LISPRO (75-25 MIX) 100 UNIT/ML KWIKPEN: PEN_INJECTOR | SUBCUTANEOUS | Status: DC

## 2015-01-22 MED ORDER — TIZANIDINE HCL 2 MG PO TABS: 2.0000 mg | ORAL_TABLET | Freq: Three times a day (TID) | ORAL | Status: DC | PRN

## 2015-01-22 NOTE — Telephone Encounter (Signed)
Dr.K, pt c/o yeast infection in bilateral groin area and possible antibiotic due to 2 areas are weeping. Please advise.
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
## 2015-01-22 NOTE — Patient Instructions (Signed)
Limit your sodium (Salt) intake  You need to lose weight.  Consider a lower calorie diet and regular exercise.   Please check your hemoglobin A1c every 3 months  Please check your blood pressure on a regular basis.  If it is consistently greater than 150/90, please make an office appointment.  

## 2015-01-22 NOTE — Telephone Encounter (Signed)
Have patient use Lotrimin cream twice daily for 2 weeks  Keep area clean and dry and avoid excess heat and moisture  Wear loose fitting cotton garments and avoid synthetics

## 2015-01-22 NOTE — Progress Notes (Signed)
Pre visit review using our clinic review tool, if applicable. No additional management support is needed unless otherwise documented below in the visit note. 

## 2015-01-22 NOTE — Telephone Encounter (Signed)
Pt called to ask for a call back. She said some medication was going to be called in for her and she does not what the medicine

## 2015-01-23 NOTE — Telephone Encounter (Signed)
Left message on voicemail to call office.  

## 2015-01-23 NOTE — Telephone Encounter (Signed)
Pt called back, told her apply Lotrimin cream OTC twice daily for 2 weeks,Keep area clean and dry and avoid excess heat and moisture, and wear loose fitting cotton garments and avoid synthetics per Dr. Raliegh Ip. Pt verbalized understanding.

## 2015-02-05 DIAGNOSIS — M47816 Spondylosis without myelopathy or radiculopathy, lumbar region: Secondary | ICD-10-CM | POA: Diagnosis not present

## 2015-02-05 DIAGNOSIS — M4806 Spinal stenosis, lumbar region: Secondary | ICD-10-CM | POA: Diagnosis not present

## 2015-02-10 DIAGNOSIS — H2522 Age-related cataract, morgagnian type, left eye: Secondary | ICD-10-CM | POA: Diagnosis not present

## 2015-02-10 DIAGNOSIS — H2589 Other age-related cataract: Secondary | ICD-10-CM | POA: Diagnosis not present

## 2015-02-20 ENCOUNTER — Ambulatory Visit (INDEPENDENT_AMBULATORY_CARE_PROVIDER_SITE_OTHER): Payer: Medicare Other | Admitting: Internal Medicine

## 2015-02-20 ENCOUNTER — Encounter: Payer: Self-pay | Admitting: Internal Medicine

## 2015-02-20 VITALS — BP 126/72 | HR 67 | Temp 98.4°F | Resp 20 | Ht 64.0 in | Wt 278.0 lb

## 2015-02-20 DIAGNOSIS — R600 Localized edema: Secondary | ICD-10-CM

## 2015-02-20 DIAGNOSIS — I10 Essential (primary) hypertension: Secondary | ICD-10-CM

## 2015-02-20 DIAGNOSIS — R059 Cough, unspecified: Secondary | ICD-10-CM

## 2015-02-20 DIAGNOSIS — E119 Type 2 diabetes mellitus without complications: Secondary | ICD-10-CM

## 2015-02-20 DIAGNOSIS — R05 Cough: Secondary | ICD-10-CM

## 2015-02-20 MED ORDER — BENZONATATE 200 MG PO CAPS: 200.0000 mg | ORAL_CAPSULE | Freq: Two times a day (BID) | ORAL | Status: DC | PRN

## 2015-02-20 MED ORDER — FUROSEMIDE 40 MG PO TABS: 40.0000 mg | ORAL_TABLET | Freq: Every day | ORAL | Status: DC

## 2015-02-20 MED ORDER — INSULIN LISPRO PROT & LISPRO (75-25 MIX) 100 UNIT/ML KWIKPEN: 20.0000 [IU] | PEN_INJECTOR | Freq: Two times a day (BID) | SUBCUTANEOUS | Status: DC

## 2015-02-20 NOTE — Patient Instructions (Addendum)
Furosemide 40 mg daily until swelling.  Controlled then use only as necessary  Limit your sodium (Salt) intake     Take over-the-counter expectorants and cough medications such as  Mucinex DM.  Call if there is no improvement in 5 to 7 days or if  you develop worsening cough, fever, or new symptoms, such as shortness of breath or chest pain.   Decrease insulin to 20 units twice daily    Low-Sodium Eating Plan Sodium raises blood pressure and causes water to be held in the body. Getting less sodium from food will help lower your blood pressure, reduce any swelling, and protect your heart, liver, and kidneys. We get sodium by adding salt (sodium chloride) to food. Most of our sodium comes from canned, boxed, and frozen foods. Restaurant foods, fast foods, and pizza are also very high in sodium. Even if you take medicine to lower your blood pressure or to reduce fluid in your body, getting less sodium from your food is important. WHAT IS MY PLAN? Most people should limit their sodium intake to 2,300 mg a day. Your health care provider recommends that you limit your sodium intake to __________ a day.  WHAT DO I NEED TO KNOW ABOUT THIS EATING PLAN? For the low-sodium eating plan, you will follow these general guidelines:  Choose foods with a % Daily Value for sodium of less than 5% (as listed on the food label).   Use salt-free seasonings or herbs instead of table salt or sea salt.   Check with your health care provider or pharmacist before using salt substitutes.   Eat fresh foods.  Eat more vegetables and fruits.  Limit canned vegetables. If you do use them, rinse them well to decrease the sodium.   Limit cheese to 1 oz (28 g) per day.   Eat lower-sodium products, often labeled as "lower sodium" or "no salt added."  Avoid foods that contain monosodium glutamate (MSG). MSG is sometimes added to [place] food and some canned foods.  Check food labels (Nutrition Facts labels) on  foods to learn how much sodium is in one serving.  Eat more home-cooked food and less restaurant, buffet, and fast food.  When eating at a restaurant, ask that your food be prepared with less salt or none, if possible.  HOW DO I READ FOOD LABELS FOR SODIUM INFORMATION? The Nutrition Facts label lists the amount of sodium in one serving of the food. If you eat more than one serving, you must multiply the listed amount of sodium by the number of servings. Food labels may also identify foods as:  Sodium free--Less than 5 mg in a serving.  Very low sodium--35 mg or less in a serving.  Low sodium--140 mg or less in a serving.  Light in sodium--50% less sodium in a serving. For example, if a food that usually has 300 mg of sodium is changed to become light in sodium, it will have 150 mg of sodium.  Reduced sodium--25% less sodium in a serving. For example, if a food that usually has 400 mg of sodium is changed to reduced sodium, it will have 300 mg of sodium. WHAT FOODS CAN I EAT? Grains Low-sodium cereals, including oats, puffed wheat and rice, and shredded wheat cereals. Low-sodium crackers. Unsalted rice and pasta. Lower-sodium bread.  Vegetables Frozen or fresh vegetables. Low-sodium or reduced-sodium canned vegetables. Low-sodium or reduced-sodium tomato sauce and paste. Low-sodium or reduced-sodium tomato and vegetable juices.  Fruits Fresh, frozen, and canned fruit. Fruit  juice.  Meat and Other Protein Products Low-sodium canned tuna and salmon. Fresh or frozen meat, poultry, seafood, and fish. Lamb. Unsalted nuts. Dried beans, peas, and lentils without added salt. Unsalted canned beans. Homemade soups without salt. Eggs.  Dairy Milk. Soy milk. Ricotta cheese. Low-sodium or reduced-sodium cheeses. Yogurt.  Condiments Fresh and dried herbs and spices. Salt-free seasonings. Onion and garlic powders. Low-sodium varieties of mustard and ketchup. Lemon juice.  Fats and  Oils Reduced-sodium salad dressings. Unsalted butter.  Other Unsalted popcorn and pretzels.  The items listed above may not be a complete list of recommended foods or beverages. Contact your dietitian for more options. WHAT FOODS ARE NOT RECOMMENDED? Grains Instant hot cereals. Bread stuffing, pancake, and biscuit mixes. Croutons. Seasoned rice or pasta mixes. Noodle soup cups. Boxed or frozen macaroni and cheese. Self-rising flour. Regular salted crackers. Vegetables Regular canned vegetables. Regular canned tomato sauce and paste. Regular tomato and vegetable juices. Frozen vegetables in sauces. Salted french fries. Olives. Angie Fava. Relishes. Sauerkraut. Salsa. Meat and Other Protein Products Salted, canned, smoked, spiced, or pickled meats, seafood, or fish. Bacon, ham, sausage, hot dogs, corned beef, chipped beef, and packaged luncheon meats. Salt pork. Jerky. Pickled herring. Anchovies, regular canned tuna, and sardines. Salted nuts. Dairy Processed cheese and cheese spreads. Cheese curds. Blue cheese and cottage cheese. Buttermilk.  Condiments Onion and garlic salt, seasoned salt, table salt, and sea salt. Canned and packaged gravies. Worcestershire sauce. Tartar sauce. Barbecue sauce. Teriyaki sauce. Soy sauce, including reduced sodium. Steak sauce. Fish sauce. Oyster sauce. Cocktail sauce. Horseradish. Regular ketchup and mustard. Meat flavorings and tenderizers. Bouillon cubes. Hot sauce. Tabasco sauce. Marinades. Taco seasonings. Relishes. Fats and Oils Regular salad dressings. Salted butter. Margarine. Ghee. Bacon fat.  Other Potato and tortilla chips. Corn chips and puffs. Salted popcorn and pretzels. Canned or dried soups. Pizza. Frozen entrees and pot pies.  The items listed above may not be a complete list of foods and beverages to avoid. Contact your dietitian for more information. Document Released: 02/11/2002 Document Revised: 08/27/2013 Document Reviewed:  06/26/2013 [facility] Patient Information 2015 [place], [place]. This information is not intended to replace advice given to you by your health care provider. Make sure you discuss any questions you have with your health care provider.

## 2015-02-20 NOTE — Progress Notes (Signed)
Pre visit review using our clinic review tool, if applicable. No additional management support is needed unless otherwise documented below in the visit note. 
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
## 2015-03-12 DIAGNOSIS — M4854XA Collapsed vertebra, not elsewhere classified, thoracic region, initial encounter for fracture: Secondary | ICD-10-CM | POA: Diagnosis not present

## 2015-03-12 DIAGNOSIS — M4806 Spinal stenosis, lumbar region: Secondary | ICD-10-CM | POA: Diagnosis not present

## 2015-03-12 DIAGNOSIS — Z6841 Body Mass Index (BMI) 40.0 and over, adult: Secondary | ICD-10-CM | POA: Diagnosis not present

## 2015-03-12 DIAGNOSIS — I10 Essential (primary) hypertension: Secondary | ICD-10-CM | POA: Diagnosis not present

## 2015-03-12 DIAGNOSIS — M47816 Spondylosis without myelopathy or radiculopathy, lumbar region: Secondary | ICD-10-CM | POA: Diagnosis not present

## 2015-03-20 DIAGNOSIS — H3531 Nonexudative age-related macular degeneration: Secondary | ICD-10-CM | POA: Diagnosis not present

## 2015-03-30 ENCOUNTER — Encounter (INDEPENDENT_AMBULATORY_CARE_PROVIDER_SITE_OTHER): Payer: Self-pay | Admitting: Ophthalmology

## 2015-04-23 ENCOUNTER — Encounter (INDEPENDENT_AMBULATORY_CARE_PROVIDER_SITE_OTHER): Payer: Self-pay | Admitting: Ophthalmology

## 2015-04-24 ENCOUNTER — Ambulatory Visit: Payer: Medicare Other | Admitting: Internal Medicine

## 2015-05-07 ENCOUNTER — Encounter (INDEPENDENT_AMBULATORY_CARE_PROVIDER_SITE_OTHER): Payer: Self-pay | Admitting: Ophthalmology

## 2015-05-19 ENCOUNTER — Encounter (INDEPENDENT_AMBULATORY_CARE_PROVIDER_SITE_OTHER): Payer: Medicare Other | Admitting: Ophthalmology

## 2015-05-19 DIAGNOSIS — H3531 Nonexudative age-related macular degeneration: Secondary | ICD-10-CM | POA: Diagnosis not present

## 2015-05-19 DIAGNOSIS — H35033 Hypertensive retinopathy, bilateral: Secondary | ICD-10-CM | POA: Diagnosis not present

## 2015-05-19 DIAGNOSIS — H43813 Vitreous degeneration, bilateral: Secondary | ICD-10-CM

## 2015-05-19 DIAGNOSIS — I10 Essential (primary) hypertension: Secondary | ICD-10-CM

## 2015-05-19 DIAGNOSIS — E11319 Type 2 diabetes mellitus with unspecified diabetic retinopathy without macular edema: Secondary | ICD-10-CM | POA: Diagnosis not present

## 2015-05-19 DIAGNOSIS — E11329 Type 2 diabetes mellitus with mild nonproliferative diabetic retinopathy without macular edema: Secondary | ICD-10-CM

## 2015-05-21 ENCOUNTER — Ambulatory Visit: Payer: Medicare Other | Admitting: Internal Medicine

## 2015-06-15 ENCOUNTER — Telehealth: Payer: Self-pay | Admitting: Internal Medicine

## 2015-06-15 MED ORDER — INSULIN LISPRO PROT & LISPRO (75-25 MIX) 100 UNIT/ML KWIKPEN: 26.0000 [IU] | PEN_INJECTOR | Freq: Two times a day (BID) | SUBCUTANEOUS | Status: DC

## 2015-06-15 MED ORDER — CLONIDINE HCL 0.1 MG PO TABS: 0.1000 mg | ORAL_TABLET | Freq: Two times a day (BID) | ORAL | Status: DC

## 2015-06-15 MED ORDER — ATENOLOL 50 MG PO TABS: 50.0000 mg | ORAL_TABLET | Freq: Every day | ORAL | Status: DC

## 2015-06-15 MED ORDER — METFORMIN HCL 1000 MG PO TABS: 1000.0000 mg | ORAL_TABLET | Freq: Two times a day (BID) | ORAL | Status: DC

## 2015-06-15 MED ORDER — LEVOTHYROXINE SODIUM 175 MCG PO TABS: 175.0000 ug | ORAL_TABLET | Freq: Every day | ORAL | Status: DC

## 2015-06-15 MED ORDER — GABAPENTIN 300 MG PO CAPS: 300.0000 mg | ORAL_CAPSULE | Freq: Two times a day (BID) | ORAL | Status: DC

## 2015-06-15 MED ORDER — LOSARTAN POTASSIUM 100 MG PO TABS: 100.0000 mg | ORAL_TABLET | Freq: Every day | ORAL | Status: DC

## 2015-06-15 MED ORDER — OXYBUTYNIN CHLORIDE 5 MG PO TABS: 5.0000 mg | ORAL_TABLET | Freq: Two times a day (BID) | ORAL | Status: DC

## 2015-06-15 MED ORDER — PAROXETINE HCL 40 MG PO TABS: 40.0000 mg | ORAL_TABLET | Freq: Every day | ORAL | Status: DC

## 2015-06-15 NOTE — Telephone Encounter (Signed)
Left detailed message on personal voicemail Rx's sent to pharmacy as requested.

## 2015-06-15 NOTE — Telephone Encounter (Signed)
Pt request refill   atenolol (TENORMIN) 50 MG tablet cloNIDine (CATAPRES) 0.1 MG tablet gabapentin (NEURONTIN) 300 MG capsule Insulin Lispro Prot & Lispro (HUMALOG MIX 75/25 KWIKPEN)  levothyroxine (SYNTHROID, LEVOTHROID) 175 MCG tablet losartan (COZAAR) 100 MG tablet metFORMIN (GLUCOPHAGE) 1000 MG tablet PARoxetine (PAXIL) 40 MG tablet oxybutynin (DITROPAN) 5 MG tablet    [facility]  Pt has appt on oct 26

## 2015-06-22 ENCOUNTER — Telehealth: Payer: Self-pay | Admitting: Internal Medicine

## 2015-06-22 MED ORDER — LEVOTHYROXINE SODIUM 175 MCG PO TABS: 175.0000 ug | ORAL_TABLET | Freq: Every day | ORAL | Status: DC

## 2015-06-22 NOTE — Telephone Encounter (Signed)
Pt needs refill on levothyroxine 175 mcg #90 w/[facility] [address]. Pt no longer get her med from [facility]. Pt is back in [employer]

## 2015-06-22 NOTE — Telephone Encounter (Signed)
Rx sent to pharmacy   

## 2015-07-01 ENCOUNTER — Ambulatory Visit: Payer: Medicare Other | Admitting: Internal Medicine

## 2015-07-06 ENCOUNTER — Ambulatory Visit: Payer: Medicare Other | Admitting: Internal Medicine

## 2015-08-11 ENCOUNTER — Ambulatory Visit: Payer: Medicare Other | Admitting: Internal Medicine

## 2015-08-14 ENCOUNTER — Ambulatory Visit: Payer: Medicare Other | Admitting: Internal Medicine

## 2015-08-21 ENCOUNTER — Ambulatory Visit (INDEPENDENT_AMBULATORY_CARE_PROVIDER_SITE_OTHER): Payer: Medicare Other | Admitting: Internal Medicine

## 2015-08-21 ENCOUNTER — Telehealth: Payer: Self-pay | Admitting: Obstetrics and Gynecology

## 2015-08-21 ENCOUNTER — Encounter: Payer: Self-pay | Admitting: Internal Medicine

## 2015-08-21 VITALS — BP 150/100 | HR 74 | Temp 97.8°F | Ht 64.0 in | Wt 288.0 lb

## 2015-08-21 DIAGNOSIS — N950 Postmenopausal bleeding: Secondary | ICD-10-CM

## 2015-08-21 DIAGNOSIS — E119 Type 2 diabetes mellitus without complications: Secondary | ICD-10-CM | POA: Insufficient documentation

## 2015-08-21 DIAGNOSIS — I10 Essential (primary) hypertension: Secondary | ICD-10-CM | POA: Diagnosis not present

## 2015-08-21 DIAGNOSIS — E039 Hypothyroidism, unspecified: Secondary | ICD-10-CM

## 2015-08-21 LAB — COMPREHENSIVE METABOLIC PANEL
ALBUMIN: 3.4 g/dL — AB (ref 3.5–5.2)
ALK PHOS: 101 U/L (ref 39–117)
ALT: 19 U/L (ref 0–35)
AST: 35 U/L (ref 0–37)
BILIRUBIN TOTAL: 0.8 mg/dL (ref 0.2–1.2)
BUN: 12 mg/dL (ref 6–23)
CALCIUM: 9.7 mg/dL (ref 8.4–10.5)
CHLORIDE: 105 meq/L (ref 96–112)
CO2: 30 mEq/L (ref 19–32)
CREATININE: 0.66 mg/dL (ref 0.40–1.20)
GFR: 95.19 mL/min (ref >60.00)
Glucose, Bld: 110 mg/dL — ABNORMAL HIGH (ref 70–99)
Potassium: 4.3 mEq/L (ref 3.5–5.1)
SODIUM: 142 meq/L (ref 135–145)
TOTAL PROTEIN: 6.5 g/dL (ref 6.0–8.3)

## 2015-08-21 LAB — CBC WITH DIFFERENTIAL/PLATELET
Basophils Absolute: 0 10*3/uL (ref 0.0–0.1)
Basophils Relative: 0.3 % (ref 0.0–3.0)
Eosinophils Absolute: 0.1 10*3/uL (ref 0.0–0.7)
Eosinophils Relative: 2.3 % (ref 0.0–5.0)
HCT: 34.4 % — ABNORMAL LOW (ref 36.0–46.0)
Hemoglobin: 11.1 g/dL — ABNORMAL LOW (ref 12.0–15.0)
Lymphocytes Relative: 35 % (ref 12.0–46.0)
Lymphs Abs: 1.6 10*3/uL (ref 0.7–4.0)
MCHC: 32.4 g/dL (ref 30.0–36.0)
MCV: 90.5 fl (ref 78.0–100.0)
Monocytes Absolute: 0.5 10*3/uL (ref 0.1–1.0)
Monocytes Relative: 11.7 % (ref 3.0–12.0)
Neutro Abs: 2.3 10*3/uL (ref 1.4–7.7)
Neutrophils Relative %: 50.7 % (ref 43.0–77.0)
Platelets: 148 10*3/uL — ABNORMAL LOW (ref 150.0–400.0)
RBC: 3.8 Mil/uL — ABNORMAL LOW (ref 3.87–5.11)
RDW: 15.3 % (ref 11.5–15.5)
WBC: 4.6 10*3/uL (ref 4.0–10.5)

## 2015-08-21 LAB — HEMOGLOBIN A1C: HEMOGLOBIN A1C: 6.4 % (ref 4.6–6.5)

## 2015-08-21 LAB — TSH: TSH: 3.73 u[IU]/mL (ref 0.35–4.50)

## 2015-08-21 MED ORDER — OXYBUTYNIN CHLORIDE 5 MG PO TABS: 5.0000 mg | ORAL_TABLET | Freq: Two times a day (BID) | ORAL | Status: DC

## 2015-08-21 MED ORDER — FUROSEMIDE 40 MG PO TABS: 40.0000 mg | ORAL_TABLET | Freq: Every day | ORAL | Status: AC

## 2015-08-21 MED ORDER — INSULIN LISPRO PROT & LISPRO (75-25 MIX) 100 UNIT/ML KWIKPEN: 26.0000 [IU] | PEN_INJECTOR | Freq: Two times a day (BID) | SUBCUTANEOUS | Status: DC

## 2015-08-21 MED ORDER — CLONIDINE HCL 0.1 MG PO TABS: 0.1000 mg | ORAL_TABLET | Freq: Two times a day (BID) | ORAL | Status: DC

## 2015-08-21 MED ORDER — PAROXETINE HCL 40 MG PO TABS: 40.0000 mg | ORAL_TABLET | Freq: Every day | ORAL | Status: DC

## 2015-08-21 MED ORDER — METFORMIN HCL 1000 MG PO TABS: 1000.0000 mg | ORAL_TABLET | Freq: Two times a day (BID) | ORAL | Status: AC

## 2015-08-21 MED ORDER — ATENOLOL 50 MG PO TABS: 50.0000 mg | ORAL_TABLET | Freq: Every day | ORAL | Status: DC

## 2015-08-21 MED ORDER — LEVOTHYROXINE SODIUM 175 MCG PO TABS
175.0000 ug | ORAL_TABLET | Freq: Every day | ORAL | Status: AC
Start: 2015-08-21 — End: ?

## 2015-08-21 MED ORDER — LOSARTAN POTASSIUM 100 MG PO TABS: 100.0000 mg | ORAL_TABLET | Freq: Every day | ORAL | Status: AC

## 2015-08-21 MED ORDER — GABAPENTIN 300 MG PO CAPS: 300.0000 mg | ORAL_CAPSULE | Freq: Two times a day (BID) | ORAL | Status: DC

## 2015-08-21 NOTE — Progress Notes (Signed)
Pre visit review using our clinic review tool, if applicable. No additional management support is needed unless otherwise documented below in the visit note. 
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
## 2015-08-21 NOTE — Patient Instructions (Signed)
Gynecologic evaluation as discussed  Low-salt diet  Continue home blood pressure monitoring  Check hemoglobin A1c every 3 months  Diet and weight loss recommended

## 2015-08-21 NOTE — Telephone Encounter (Signed)
Called and left a message for patient to call back to schedule a new patient doctor referral. °

## 2015-08-24 ENCOUNTER — Telehealth: Payer: Self-pay | Admitting: Internal Medicine

## 2015-08-24 MED ORDER — OXYBUTYNIN CHLORIDE 5 MG PO TABS: 10.0000 mg | ORAL_TABLET | Freq: Two times a day (BID) | ORAL | Status: AC

## 2015-08-24 NOTE — Telephone Encounter (Signed)
Spoke to pt, asked her how many tablets a day are you taking? Pt said 3-4 depends. Told pt okay I am going to send Rx to say to take 2 tablets twice a day that way you get enough. Pt verbalized understanding. Rx sen to pharmacy.

## 2015-08-24 NOTE — Telephone Encounter (Signed)
Pt called saying Dr. Raliegh Ip told her he'd send a Rx for Oxybutynin for her to take 1-2 pills per day. [facility] 1 pill per day. She runs out of the medication before she's supposed to and she's wondering if a new Rx can be sent to the pharmacy. [facility] [address]. If you have questions or concerns, please contact the pt.  Pt's ph# [phone] Thank you.

## 2015-09-01 ENCOUNTER — Encounter: Payer: Self-pay | Admitting: Obstetrics and Gynecology

## 2015-09-01 ENCOUNTER — Ambulatory Visit (INDEPENDENT_AMBULATORY_CARE_PROVIDER_SITE_OTHER): Payer: Medicare Other | Admitting: Obstetrics and Gynecology

## 2015-09-01 VITALS — BP 160/80 | HR 80 | Resp 16 | Ht 64.0 in | Wt 285.0 lb

## 2015-09-01 DIAGNOSIS — N3946 Mixed incontinence: Secondary | ICD-10-CM

## 2015-09-01 DIAGNOSIS — Z1272 Encounter for screening for malignant neoplasm of vagina: Secondary | ICD-10-CM | POA: Diagnosis not present

## 2015-09-01 DIAGNOSIS — N390 Urinary tract infection, site not specified: Secondary | ICD-10-CM

## 2015-09-01 DIAGNOSIS — N362 Urethral caruncle: Secondary | ICD-10-CM

## 2015-09-01 DIAGNOSIS — R109 Unspecified abdominal pain: Secondary | ICD-10-CM | POA: Diagnosis not present

## 2015-09-01 DIAGNOSIS — R350 Frequency of micturition: Secondary | ICD-10-CM | POA: Diagnosis not present

## 2015-09-01 DIAGNOSIS — N950 Postmenopausal bleeding: Secondary | ICD-10-CM | POA: Diagnosis not present

## 2015-09-01 DIAGNOSIS — L280 Lichen simplex chronicus: Secondary | ICD-10-CM

## 2015-09-01 LAB — POCT URINALYSIS DIPSTICK
BILIRUBIN UA: NEGATIVE
Blood, UA: NEGATIVE
GLUCOSE UA: NEGATIVE
KETONES UA: NEGATIVE
LEUKOCYTES UA: NEGATIVE
Nitrite, UA: POSITIVE
Protein, UA: NEGATIVE
Urobilinogen, UA: NEGATIVE
pH, UA: 5

## 2015-09-01 MED ORDER — NITROFURANTOIN MONOHYD MACRO 100 MG PO CAPS: ORAL_CAPSULE | ORAL | Status: DC

## 2015-09-01 MED ORDER — BETAMETHASONE VALERATE 0.1 % EX OINT: TOPICAL_OINTMENT | CUTANEOUS | Status: AC

## 2015-09-01 NOTE — Patient Instructions (Signed)

## 2015-09-01 NOTE — Addendum Note (Signed)
Addended by: Dorothy Spark on: 09/01/2015 11:04 AM   Modules accepted: Orders

## 2015-09-01 NOTE — Addendum Note (Signed)
Addended by: Dorothy Spark on: 09/01/2015 10:56 AM   Modules accepted: Orders
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
## 2015-09-02 LAB — URINALYSIS, MICROSCOPIC ONLY
Casts: NONE SEEN [LPF]
Yeast: NONE SEEN [HPF]

## 2015-09-03 ENCOUNTER — Telehealth: Payer: Self-pay | Admitting: Emergency Medicine

## 2015-09-03 LAB — URINE CULTURE

## 2015-09-03 NOTE — Telephone Encounter (Signed)
Call to patient and Message left to return call to [place] at [phone].

## 2015-09-03 NOTE — Telephone Encounter (Signed)
-----   Message from Salvadore Dom, MD sent at 09/03/2015  9:01 AM EST ----- Please inform the patient her urine did come back cw a UTI, see if she is feeling any different on the macrobid. Please also check the sensitivities on the culture tomorrow.

## 2015-09-04 NOTE — Telephone Encounter (Signed)
Call again to patient to follow up. Detailed message left. Okay per designated party release form.  UTI sensitive to Macrobid.  Advised patient to call back for any concerns.

## 2015-09-08 LAB — IPS PAP TEST WITH HPV
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
## 2015-09-16 ENCOUNTER — Ambulatory Visit: Payer: Medicare Other | Admitting: Obstetrics and Gynecology

## 2015-09-24 ENCOUNTER — Ambulatory Visit: Payer: Medicare Other | Admitting: Obstetrics and Gynecology

## 2015-10-01 ENCOUNTER — Ambulatory Visit: Payer: Medicare Other | Admitting: Obstetrics and Gynecology

## 2015-10-13 ENCOUNTER — Telehealth: Payer: Self-pay | Admitting: Obstetrics and Gynecology

## 2015-10-13 NOTE — Telephone Encounter (Signed)
Patient called and cancelled her 2 week recheck for 10/15/15 that had been previously rescheduled.  She said, "I fell last week and am recovering from that.  Hopefully next week I will be feeling less sore."  FYI only.

## 2015-10-15 ENCOUNTER — Ambulatory Visit: Payer: Medicare Other | Admitting: Obstetrics and Gynecology

## 2015-10-27 DIAGNOSIS — I8310 Varicose veins of unspecified lower extremity with inflammation: Secondary | ICD-10-CM | POA: Diagnosis not present

## 2015-10-27 DIAGNOSIS — L309 Dermatitis, unspecified: Secondary | ICD-10-CM | POA: Diagnosis not present

## 2015-10-29 ENCOUNTER — Ambulatory Visit: Payer: Medicare Other | Admitting: Obstetrics and Gynecology

## 2015-11-06 ENCOUNTER — Encounter: Payer: Self-pay | Admitting: Gastroenterology

## 2015-11-12 ENCOUNTER — Telehealth: Payer: Self-pay | Admitting: Obstetrics and Gynecology

## 2015-11-12 ENCOUNTER — Ambulatory Visit: Payer: Medicare Other | Admitting: Obstetrics and Gynecology

## 2015-11-12 NOTE — Telephone Encounter (Signed)
Patient called and cancelled her appointment for her two week recheck today due to being "sick, sick, sick." She rescheduled to 11/18/15.

## 2015-11-16 ENCOUNTER — Ambulatory Visit (INDEPENDENT_AMBULATORY_CARE_PROVIDER_SITE_OTHER): Payer: Medicare Other | Admitting: Family Medicine

## 2015-11-16 ENCOUNTER — Encounter: Payer: Self-pay | Admitting: Family Medicine

## 2015-11-16 ENCOUNTER — Telehealth: Payer: Self-pay | Admitting: Internal Medicine

## 2015-11-16 VITALS — HR 68 | Temp 98.6°F

## 2015-11-16 DIAGNOSIS — R197 Diarrhea, unspecified: Secondary | ICD-10-CM | POA: Diagnosis not present

## 2015-11-16 MED ORDER — METRONIDAZOLE 500 MG PO TABS: 500.0000 mg | ORAL_TABLET | Freq: Three times a day (TID) | ORAL | Status: DC

## 2015-11-16 NOTE — Telephone Encounter (Signed)
Pt had had diarrea with no relief for almost a month. No relief with Imodium and she has altered her diet. Would like to know if phenergan would help or if there is something that can be called in.

## 2015-11-16 NOTE — Telephone Encounter (Signed)
Please call pt and schedule an appt to see a provider due to Dr.K is out all week. Pt needs evaluation due to length of problem. Phenergan will not help diarrhea. Thanks

## 2015-11-16 NOTE — Progress Notes (Signed)
Pre visit review using our clinic review tool, if applicable. No additional management support is needed unless otherwise documented below in the visit note. 

## 2015-11-16 NOTE — Telephone Encounter (Signed)
Called patient and scheduled an appointment for 11/17/15 with Dr Sarajane Jews.

## 2015-11-17 ENCOUNTER — Ambulatory Visit: Payer: Medicare Other | Admitting: Family Medicine

## 2015-11-18 ENCOUNTER — Telehealth: Payer: Self-pay | Admitting: Internal Medicine

## 2015-11-18 ENCOUNTER — Ambulatory Visit: Payer: Medicare Other | Admitting: Obstetrics and Gynecology

## 2015-11-18 ENCOUNTER — Telehealth: Payer: Self-pay | Admitting: Obstetrics and Gynecology

## 2015-11-18 NOTE — Progress Notes (Signed)
   Subjective:    Patient ID: Pola Corn, female    DOB: [birth year], 65 y.o.   MRN: [id number]  HPI Here for 5 weeks of intermittent GI issues including nausea, vomiting, bloating, diarrhea, and right flank pain. No fever. Now for the past week her chief complaint is diarrhea. She is eating and drinking normally. No weight changes.    Review of Systems  Constitutional: Negative.   Respiratory: Negative.   Cardiovascular: Negative.   Gastrointestinal: Positive for diarrhea. Negative for nausea, vomiting, abdominal pain, constipation, blood in stool, abdominal distention, anal bleeding and rectal pain.  Genitourinary: Negative.        Objective:   Physical Exam  Constitutional: She appears well-developed and well-nourished.  Cardiovascular: Normal rate, regular rhythm, normal heart sounds and intact distal pulses.   Pulmonary/Chest: Effort normal and breath sounds normal.  Abdominal: Soft. Bowel sounds are normal. She exhibits no distension and no mass. There is no tenderness. There is no rebound and no guarding.          Assessment & Plan:  Diarrhea of uncertain etiology. Treat wit Flagyl and a probiotic like Align daily. Recheck prn

## 2015-11-18 NOTE — Telephone Encounter (Signed)
Pt states she just started the metroNIDAZOLE (FLAGYL) 500 MG tablet yesterday and is not much better. Pt  would like to know if  Dr Sarajane Jews thinks she does has C-Diff and if she is contagious.  Pt wants to ensure she is taking the correct medicine.

## 2015-11-18 NOTE — Telephone Encounter (Signed)
Patient canceled her appointment for today for follow up. She has c diff infection. Will call later to reschedule her appointment.

## 2015-11-18 NOTE — Telephone Encounter (Signed)
Per Dr. Sarajane Jews, it will take some time for this to clear up, pt should continue with medication and follow up as needed.

## 2015-11-20 ENCOUNTER — Ambulatory Visit: Payer: Medicare Other | Admitting: Internal Medicine

## 2015-11-24 ENCOUNTER — Encounter: Payer: Self-pay | Admitting: Internal Medicine

## 2015-11-24 ENCOUNTER — Ambulatory Visit (INDEPENDENT_AMBULATORY_CARE_PROVIDER_SITE_OTHER): Payer: Medicare Other | Admitting: Internal Medicine

## 2015-11-24 VITALS — BP 138/70 | HR 76 | Temp 98.3°F | Resp 20 | Ht 64.0 in | Wt 316.0 lb

## 2015-11-24 DIAGNOSIS — B9789 Other viral agents as the cause of diseases classified elsewhere: Secondary | ICD-10-CM

## 2015-11-24 DIAGNOSIS — E039 Hypothyroidism, unspecified: Secondary | ICD-10-CM

## 2015-11-24 DIAGNOSIS — E119 Type 2 diabetes mellitus without complications: Secondary | ICD-10-CM | POA: Diagnosis not present

## 2015-11-24 DIAGNOSIS — I10 Essential (primary) hypertension: Secondary | ICD-10-CM

## 2015-11-24 DIAGNOSIS — J069 Acute upper respiratory infection, unspecified: Secondary | ICD-10-CM

## 2015-11-24 LAB — COMPREHENSIVE METABOLIC PANEL
ALK PHOS: 149 U/L — AB (ref 39–117)
ALT: 52 U/L — ABNORMAL HIGH (ref 0–35)
AST: 173 U/L — ABNORMAL HIGH (ref 0–37)
Albumin: 2.4 g/dL — ABNORMAL LOW (ref 3.5–5.2)
BILIRUBIN TOTAL: 1.7 mg/dL — AB (ref 0.2–1.2)
BUN: 15 mg/dL (ref 6–23)
CALCIUM: 8.8 mg/dL (ref 8.4–10.5)
CO2: 24 meq/L (ref 19–32)
Chloride: 105 mEq/L (ref 96–112)
Creatinine, Ser: 0.88 mg/dL (ref 0.40–1.20)
GFR: 68.25 mL/min (ref >60.00)
GLUCOSE: 88 mg/dL (ref 70–99)
POTASSIUM: 3.9 meq/L (ref 3.5–5.1)
Sodium: 139 mEq/L (ref 135–145)
Total Protein: 6.4 g/dL (ref 6.0–8.3)

## 2015-11-24 LAB — HEMOGLOBIN A1C: HEMOGLOBIN A1C: 6.1 % (ref 4.6–6.5)

## 2015-11-24 LAB — TSH: TSH: 6.43 u[IU]/mL — ABNORMAL HIGH (ref 0.35–4.50)

## 2015-11-24 MED ORDER — VENTOLIN HFA 108 (90 BASE) MCG/ACT IN AERS: 2.0000 | INHALATION_SPRAY | Freq: Four times a day (QID) | RESPIRATORY_TRACT | Status: AC | PRN

## 2015-11-24 NOTE — Patient Instructions (Signed)
Acute bronchitis symptoms for less than 10 days are generally not helped by antibiotics.  Take over-the-counter expectorants and cough medications such as  Mucinex DM.  Call if there is no improvement in 5 to 7 days or if  you develop worsening cough, fever, or new symptoms, such as shortness of breath or chest pain.  HOME CARE INSTRUCTIONS  Drink plenty of water. Water helps thin the mucus so your sinuses can drain more easily.  Use a humidifier.  Inhale steam 3-4 times a day (for example, sit in the bathroom with the shower running).  Apply a warm, moist washcloth to your face 3-4 times a day, or as directed by your health care provider.  Use saline nasal sprays to help moisten and clean your sinuses.  Take medicines only as directed by your health care provider.   You need to lose weight.  Consider a lower calorie diet and regular exercise.  Limit your sodium (Salt) intake   Please check your hemoglobin A1c every 3 months
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
## 2015-11-24 NOTE — Progress Notes (Signed)
Pre visit review using our clinic review tool, if applicable. No additional management support is needed unless otherwise documented below in the visit note. 

## 2015-11-25 ENCOUNTER — Other Ambulatory Visit: Payer: Self-pay | Admitting: Internal Medicine

## 2015-11-25 DIAGNOSIS — R748 Abnormal levels of other serum enzymes: Secondary | ICD-10-CM

## 2015-11-27 DIAGNOSIS — R069 Unspecified abnormalities of breathing: Secondary | ICD-10-CM | POA: Diagnosis not present

## 2015-11-28 ENCOUNTER — Encounter (HOSPITAL_COMMUNITY): Payer: Self-pay | Admitting: Emergency Medicine

## 2015-11-28 ENCOUNTER — Inpatient Hospital Stay (HOSPITAL_COMMUNITY)
Admission: EM | Admit: 2015-11-28 | Discharge: 2015-12-15 | DRG: 193 | Disposition: A | Discharge status: Skilled Nursing Facility | Payer: Medicare Other | Attending: Internal Medicine | Admitting: Internal Medicine

## 2015-11-28 ENCOUNTER — Emergency Department (HOSPITAL_COMMUNITY): Payer: Medicare Other

## 2015-11-28 DIAGNOSIS — G8191 Hemiplegia, unspecified affecting right dominant side: Secondary | ICD-10-CM | POA: Diagnosis not present

## 2015-11-28 DIAGNOSIS — Z87440 Personal history of urinary (tract) infections: Secondary | ICD-10-CM | POA: Diagnosis not present

## 2015-11-28 DIAGNOSIS — I5033 Acute on chronic diastolic (congestive) heart failure: Secondary | ICD-10-CM | POA: Diagnosis not present

## 2015-11-28 DIAGNOSIS — G9340 Encephalopathy, unspecified: Secondary | ICD-10-CM | POA: Diagnosis not present

## 2015-11-28 DIAGNOSIS — I63512 Cerebral infarction due to unspecified occlusion or stenosis of left middle cerebral artery: Secondary | ICD-10-CM | POA: Diagnosis not present

## 2015-11-28 DIAGNOSIS — R531 Weakness: Secondary | ICD-10-CM

## 2015-11-28 DIAGNOSIS — J45901 Unspecified asthma with (acute) exacerbation: Secondary | ICD-10-CM | POA: Diagnosis not present

## 2015-11-28 DIAGNOSIS — R197 Diarrhea, unspecified: Secondary | ICD-10-CM | POA: Diagnosis not present

## 2015-11-28 DIAGNOSIS — N179 Acute kidney failure, unspecified: Secondary | ICD-10-CM | POA: Diagnosis not present

## 2015-11-28 DIAGNOSIS — E1140 Type 2 diabetes mellitus with diabetic neuropathy, unspecified: Secondary | ICD-10-CM | POA: Diagnosis present

## 2015-11-28 DIAGNOSIS — Z9989 Dependence on other enabling machines and devices: Secondary | ICD-10-CM

## 2015-11-28 DIAGNOSIS — Z794 Long term (current) use of insulin: Secondary | ICD-10-CM | POA: Diagnosis not present

## 2015-11-28 DIAGNOSIS — N390 Urinary tract infection, site not specified: Secondary | ICD-10-CM | POA: Diagnosis not present

## 2015-11-28 DIAGNOSIS — E1165 Type 2 diabetes mellitus with hyperglycemia: Secondary | ICD-10-CM | POA: Diagnosis not present

## 2015-11-28 DIAGNOSIS — I10 Essential (primary) hypertension: Secondary | ICD-10-CM | POA: Diagnosis not present

## 2015-11-28 DIAGNOSIS — M25551 Pain in right hip: Secondary | ICD-10-CM | POA: Diagnosis not present

## 2015-11-28 DIAGNOSIS — W19XXXA Unspecified fall, initial encounter: Secondary | ICD-10-CM | POA: Diagnosis present

## 2015-11-28 DIAGNOSIS — M1990 Unspecified osteoarthritis, unspecified site: Secondary | ICD-10-CM | POA: Diagnosis not present

## 2015-11-28 DIAGNOSIS — Z9119 Patient's noncompliance with other medical treatment and regimen: Secondary | ICD-10-CM

## 2015-11-28 DIAGNOSIS — G4733 Obstructive sleep apnea (adult) (pediatric): Secondary | ICD-10-CM | POA: Diagnosis present

## 2015-11-28 DIAGNOSIS — Z23 Encounter for immunization: Secondary | ICD-10-CM

## 2015-11-28 DIAGNOSIS — J690 Pneumonitis due to inhalation of food and vomit: Secondary | ICD-10-CM | POA: Diagnosis not present

## 2015-11-28 DIAGNOSIS — R278 Other lack of coordination: Secondary | ICD-10-CM | POA: Diagnosis present

## 2015-11-28 DIAGNOSIS — Z7982 Long term (current) use of aspirin: Secondary | ICD-10-CM | POA: Diagnosis not present

## 2015-11-28 DIAGNOSIS — J9811 Atelectasis: Secondary | ICD-10-CM | POA: Diagnosis not present

## 2015-11-28 DIAGNOSIS — J9601 Acute respiratory failure with hypoxia: Secondary | ICD-10-CM | POA: Diagnosis present

## 2015-11-28 DIAGNOSIS — Z7401 Bed confinement status: Secondary | ICD-10-CM

## 2015-11-28 DIAGNOSIS — K5900 Constipation, unspecified: Secondary | ICD-10-CM | POA: Diagnosis present

## 2015-11-28 DIAGNOSIS — Z91040 Latex allergy status: Secondary | ICD-10-CM

## 2015-11-28 DIAGNOSIS — J383 Other diseases of vocal cords: Secondary | ICD-10-CM | POA: Diagnosis present

## 2015-11-28 DIAGNOSIS — M797 Fibromyalgia: Secondary | ICD-10-CM | POA: Diagnosis not present

## 2015-11-28 DIAGNOSIS — Z9841 Cataract extraction status, right eye: Secondary | ICD-10-CM

## 2015-11-28 DIAGNOSIS — Z90710 Acquired absence of both cervix and uterus: Secondary | ICD-10-CM

## 2015-11-28 DIAGNOSIS — Z6841 Body Mass Index (BMI) 40.0 and over, adult: Secondary | ICD-10-CM

## 2015-11-28 DIAGNOSIS — Z91048 Other nonmedicinal substance allergy status: Secondary | ICD-10-CM

## 2015-11-28 DIAGNOSIS — R4701 Aphasia: Secondary | ICD-10-CM

## 2015-11-28 DIAGNOSIS — E876 Hypokalemia: Secondary | ICD-10-CM | POA: Diagnosis present

## 2015-11-28 DIAGNOSIS — Z853 Personal history of malignant neoplasm of breast: Secondary | ICD-10-CM

## 2015-11-28 DIAGNOSIS — E662 Morbid (severe) obesity with alveolar hypoventilation: Secondary | ICD-10-CM | POA: Diagnosis present

## 2015-11-28 DIAGNOSIS — E669 Obesity, unspecified: Secondary | ICD-10-CM | POA: Diagnosis present

## 2015-11-28 DIAGNOSIS — J101 Influenza due to other identified influenza virus with other respiratory manifestations: Principal | ICD-10-CM | POA: Diagnosis present

## 2015-11-28 DIAGNOSIS — I639 Cerebral infarction, unspecified: Secondary | ICD-10-CM

## 2015-11-28 DIAGNOSIS — F39 Unspecified mood [affective] disorder: Secondary | ICD-10-CM | POA: Diagnosis present

## 2015-11-28 DIAGNOSIS — R32 Unspecified urinary incontinence: Secondary | ICD-10-CM | POA: Diagnosis not present

## 2015-11-28 DIAGNOSIS — Z888 Allergy status to other drugs, medicaments and biological substances status: Secondary | ICD-10-CM

## 2015-11-28 DIAGNOSIS — E119 Type 2 diabetes mellitus without complications: Secondary | ICD-10-CM | POA: Diagnosis not present

## 2015-11-28 DIAGNOSIS — T380X5A Adverse effect of glucocorticoids and synthetic analogues, initial encounter: Secondary | ICD-10-CM | POA: Diagnosis present

## 2015-11-28 DIAGNOSIS — R0789 Other chest pain: Secondary | ICD-10-CM | POA: Diagnosis not present

## 2015-11-28 DIAGNOSIS — R9431 Abnormal electrocardiogram [ECG] [EKG]: Secondary | ICD-10-CM | POA: Clinically undetermined

## 2015-11-28 DIAGNOSIS — Z885 Allergy status to narcotic agent status: Secondary | ICD-10-CM

## 2015-11-28 DIAGNOSIS — I110 Hypertensive heart disease with heart failure: Secondary | ICD-10-CM | POA: Diagnosis present

## 2015-11-28 DIAGNOSIS — Z823 Family history of stroke: Secondary | ICD-10-CM

## 2015-11-28 DIAGNOSIS — E039 Hypothyroidism, unspecified: Secondary | ICD-10-CM | POA: Diagnosis not present

## 2015-11-28 DIAGNOSIS — R0602 Shortness of breath: Secondary | ICD-10-CM

## 2015-11-28 DIAGNOSIS — E875 Hyperkalemia: Secondary | ICD-10-CM | POA: Diagnosis present

## 2015-11-28 DIAGNOSIS — E860 Dehydration: Secondary | ICD-10-CM | POA: Diagnosis present

## 2015-11-28 DIAGNOSIS — J9690 Respiratory failure, unspecified, unspecified whether with hypoxia or hypercapnia: Secondary | ICD-10-CM

## 2015-11-28 DIAGNOSIS — I4581 Long QT syndrome: Secondary | ICD-10-CM | POA: Diagnosis not present

## 2015-11-28 DIAGNOSIS — E668 Other obesity: Secondary | ICD-10-CM | POA: Diagnosis present

## 2015-11-28 DIAGNOSIS — D72829 Elevated white blood cell count, unspecified: Secondary | ICD-10-CM

## 2015-11-28 DIAGNOSIS — Z79899 Other long term (current) drug therapy: Secondary | ICD-10-CM

## 2015-11-28 DIAGNOSIS — G894 Chronic pain syndrome: Secondary | ICD-10-CM | POA: Diagnosis present

## 2015-11-28 LAB — BASIC METABOLIC PANEL
Anion gap: 13 (ref 5–15)
BUN: 16 mg/dL (ref 6–20)
CHLORIDE: 103 mmol/L (ref 101–111)
CO2: 23 mmol/L (ref 22–32)
Calcium: 8.4 mg/dL — ABNORMAL LOW (ref 8.9–10.3)
Creatinine, Ser: 1.38 mg/dL — ABNORMAL HIGH (ref 0.44–1.00)
GFR calc Af Amer: 45 mL/min — ABNORMAL LOW (ref >60)
GFR calc non Af Amer: 39 mL/min — ABNORMAL LOW (ref >60)
GLUCOSE: 92 mg/dL (ref 65–99)
POTASSIUM: 4 mmol/L (ref 3.5–5.1)
Sodium: 139 mmol/L (ref 135–145)

## 2015-11-28 LAB — URINALYSIS, ROUTINE W REFLEX MICROSCOPIC
Bilirubin Urine: NEGATIVE
Glucose, UA: NEGATIVE mg/dL
Hgb urine dipstick: NEGATIVE
Ketones, ur: NEGATIVE mg/dL
NITRITE: POSITIVE — AB
PROTEIN: NEGATIVE mg/dL
Specific Gravity, Urine: 1.012 (ref 1.005–1.030)
pH: 6 (ref 5.0–8.0)

## 2015-11-28 LAB — CBC WITH DIFFERENTIAL/PLATELET
BASOS ABS: 0 10*3/uL (ref 0.0–0.1)
BASOS PCT: 0 %
EOS ABS: 0 10*3/uL (ref 0.0–0.7)
EOS PCT: 0 %
HCT: 34.2 % — ABNORMAL LOW (ref 36.0–46.0)
HEMOGLOBIN: 11.7 g/dL — AB (ref 12.0–15.0)
Lymphocytes Relative: 26 %
Lymphs Abs: 1.3 10*3/uL (ref 0.7–4.0)
MCH: 30.2 pg (ref 26.0–34.0)
MCHC: 34.2 g/dL (ref 30.0–36.0)
MCV: 88.4 fL (ref 78.0–100.0)
MONO ABS: 0.7 10*3/uL (ref 0.1–1.0)
MONOS PCT: 14 %
Neutro Abs: 3 10*3/uL (ref 1.7–7.7)
Neutrophils Relative %: 60 %
Platelets: 113 10*3/uL — ABNORMAL LOW (ref 150–400)
RBC: 3.87 MIL/uL (ref 3.87–5.11)
RDW: 22 % — ABNORMAL HIGH (ref 11.5–15.5)
WBC: 5 10*3/uL (ref 4.0–10.5)

## 2015-11-28 LAB — BRAIN NATRIURETIC PEPTIDE: B Natriuretic Peptide: 256.3 pg/mL — ABNORMAL HIGH (ref 0.0–100.0)

## 2015-11-28 LAB — URINE MICROSCOPIC-ADD ON

## 2015-11-28 LAB — GLUCOSE, CAPILLARY
GLUCOSE-CAPILLARY: 158 mg/dL — AB (ref 65–99)
Glucose-Capillary: 163 mg/dL — ABNORMAL HIGH (ref 65–99)

## 2015-11-28 LAB — CBG MONITORING, ED: GLUCOSE-CAPILLARY: 89 mg/dL (ref 65–99)

## 2015-11-28 LAB — CK: CK TOTAL: 379 U/L — AB (ref 38–234)

## 2015-11-28 LAB — I-STAT TROPONIN, ED: Troponin i, poc: 0.02 ng/mL (ref 0.00–0.08)

## 2015-11-28 MED ORDER — IPRATROPIUM-ALBUTEROL 0.5-2.5 (3) MG/3ML IN SOLN
3.0000 mL | RESPIRATORY_TRACT | Status: DC
  Administered 2015-11-28 – 2015-11-30 (×11): 3 mL via RESPIRATORY_TRACT
  Filled 2015-11-28 (×12): qty 3

## 2015-11-28 MED ORDER — METHYLPREDNISOLONE SODIUM SUCC 125 MG IJ SOLR
125.0000 mg | Freq: Once | INTRAMUSCULAR | Status: AC
  Administered 2015-11-28: 125 mg via INTRAVENOUS
  Filled 2015-11-28: qty 2

## 2015-11-28 MED ORDER — INSULIN GLARGINE 100 UNIT/ML ~~LOC~~ SOLN
15.0000 [IU] | Freq: Every day | SUBCUTANEOUS | Status: DC
  Administered 2015-11-28 – 2015-12-15 (×18): 15 [IU] via SUBCUTANEOUS
  Filled 2015-11-28 (×19): qty 0.15

## 2015-11-28 MED ORDER — ATENOLOL 50 MG PO TABS
50.0000 mg | ORAL_TABLET | Freq: Every day | ORAL | Status: DC
  Administered 2015-11-28 – 2015-11-30 (×2): 50 mg via ORAL
  Filled 2015-11-28 (×3): qty 1

## 2015-11-28 MED ORDER — SENNA 8.6 MG PO TABS: 1.0000 | ORAL_TABLET | Freq: Every day | ORAL | Status: DC | PRN

## 2015-11-28 MED ORDER — POLYETHYLENE GLYCOL 3350 17 G PO PACK: 17.0000 g | PACK | Freq: Every day | ORAL | Status: DC | PRN

## 2015-11-28 MED ORDER — ENOXAPARIN SODIUM 80 MG/0.8ML ~~LOC~~ SOLN
70.0000 mg | SUBCUTANEOUS | Status: DC
  Administered 2015-11-28 – 2015-12-10 (×13): 70 mg via SUBCUTANEOUS
  Filled 2015-11-28 (×13): qty 0.8

## 2015-11-28 MED ORDER — TIZANIDINE HCL 2 MG PO TABS
2.0000 mg | ORAL_TABLET | Freq: Three times a day (TID) | ORAL | Status: DC | PRN
  Administered 2015-11-30 – 2015-12-07 (×9): 2 mg via ORAL
  Filled 2015-11-28 (×12): qty 1

## 2015-11-28 MED ORDER — FENTANYL CITRATE (PF) 100 MCG/2ML IJ SOLN
50.0000 ug | Freq: Once | INTRAMUSCULAR | Status: AC
  Administered 2015-11-28: 50 ug via INTRAVENOUS
  Filled 2015-11-28: qty 2

## 2015-11-28 MED ORDER — ALBUTEROL SULFATE HFA 108 (90 BASE) MCG/ACT IN AERS
2.0000 | INHALATION_SPRAY | Freq: Four times a day (QID) | RESPIRATORY_TRACT | Status: DC | PRN
Start: 2015-11-28 — End: 2015-11-28

## 2015-11-28 MED ORDER — LEVOTHYROXINE SODIUM 175 MCG PO TABS
175.0000 ug | ORAL_TABLET | Freq: Every day | ORAL | Status: DC
  Administered 2015-11-28 – 2015-12-15 (×17): 175 ug via ORAL
  Filled 2015-11-28 (×19): qty 1

## 2015-11-28 MED ORDER — INSULIN ASPART 100 UNIT/ML ~~LOC~~ SOLN
0.0000 [IU] | Freq: Three times a day (TID) | SUBCUTANEOUS | Status: DC
  Administered 2015-11-28 – 2015-11-29 (×2): 3 [IU] via SUBCUTANEOUS
  Administered 2015-11-29: 5 [IU] via SUBCUTANEOUS
  Administered 2015-11-29 – 2015-11-30 (×2): 2 [IU] via SUBCUTANEOUS
  Administered 2015-11-30 – 2015-12-01 (×3): 3 [IU] via SUBCUTANEOUS
  Administered 2015-12-01: 2 [IU] via SUBCUTANEOUS
  Administered 2015-12-01 – 2015-12-02 (×2): 3 [IU] via SUBCUTANEOUS
  Administered 2015-12-02 (×2): 2 [IU] via SUBCUTANEOUS
  Administered 2015-12-03 (×2): 3 [IU] via SUBCUTANEOUS
  Administered 2015-12-04: 8 [IU] via SUBCUTANEOUS
  Administered 2015-12-04: 2 [IU] via SUBCUTANEOUS
  Administered 2015-12-04 – 2015-12-05 (×2): 3 [IU] via SUBCUTANEOUS
  Administered 2015-12-05: 2 [IU] via SUBCUTANEOUS
  Administered 2015-12-05: 3 [IU] via SUBCUTANEOUS
  Administered 2015-12-06: 5 [IU] via SUBCUTANEOUS
  Administered 2015-12-06 (×2): 3 [IU] via SUBCUTANEOUS
  Administered 2015-12-07: 8 [IU] via SUBCUTANEOUS
  Administered 2015-12-07: 5 [IU] via SUBCUTANEOUS
  Administered 2015-12-07: 3 [IU] via SUBCUTANEOUS
  Administered 2015-12-08: 5 [IU] via SUBCUTANEOUS
  Administered 2015-12-08: 3 [IU] via SUBCUTANEOUS
  Administered 2015-12-08: 8 [IU] via SUBCUTANEOUS
  Administered 2015-12-09 – 2015-12-13 (×8): 3 [IU] via SUBCUTANEOUS
  Administered 2015-12-14 – 2015-12-15 (×3): 2 [IU] via SUBCUTANEOUS

## 2015-11-28 MED ORDER — PNEUMOCOCCAL VAC POLYVALENT 25 MCG/0.5ML IJ INJ
0.5000 mL | INJECTION | INTRAMUSCULAR | Status: AC
  Administered 2015-11-29: 0.5 mL via INTRAMUSCULAR
  Filled 2015-11-28: qty 0.5

## 2015-11-28 MED ORDER — LOSARTAN POTASSIUM 50 MG PO TABS
100.0000 mg | ORAL_TABLET | Freq: Every day | ORAL | Status: DC
  Administered 2015-11-28: 100 mg via ORAL
  Filled 2015-11-28 (×2): qty 2

## 2015-11-28 MED ORDER — ALBUTEROL SULFATE (2.5 MG/3ML) 0.083% IN NEBU: 3.0000 mL | INHALATION_SOLUTION | Freq: Four times a day (QID) | RESPIRATORY_TRACT | Status: DC | PRN

## 2015-11-28 MED ORDER — GABAPENTIN 300 MG PO CAPS
300.0000 mg | ORAL_CAPSULE | Freq: Two times a day (BID) | ORAL | Status: DC
  Administered 2015-11-28 – 2015-12-09 (×23): 300 mg via ORAL
  Filled 2015-11-28 (×24): qty 1

## 2015-11-28 MED ORDER — METHYLPREDNISOLONE SODIUM SUCC 125 MG IJ SOLR
60.0000 mg | Freq: Two times a day (BID) | INTRAMUSCULAR | Status: DC
  Administered 2015-11-28 – 2015-11-29 (×2): 60 mg via INTRAVENOUS
  Filled 2015-11-28 (×2): qty 2

## 2015-11-28 MED ORDER — ALBUTEROL (5 MG/ML) CONTINUOUS INHALATION SOLN
10.0000 mg/h | INHALATION_SOLUTION | RESPIRATORY_TRACT | Status: DC
  Administered 2015-11-28: 10 mg/h via RESPIRATORY_TRACT
  Filled 2015-11-28 (×2): qty 20

## 2015-11-28 MED ORDER — PAROXETINE HCL 20 MG PO TABS
40.0000 mg | ORAL_TABLET | Freq: Every day | ORAL | Status: DC
  Administered 2015-11-28 – 2015-12-10 (×13): 40 mg via ORAL
  Filled 2015-11-28 (×14): qty 2

## 2015-11-28 MED ORDER — CLONIDINE HCL 0.1 MG PO TABS
0.1000 mg | ORAL_TABLET | Freq: Two times a day (BID) | ORAL | Status: DC
  Administered 2015-11-28 – 2015-11-30 (×3): 0.1 mg via ORAL
  Filled 2015-11-28 (×5): qty 1

## 2015-11-28 MED ORDER — FUROSEMIDE 40 MG PO TABS
40.0000 mg | ORAL_TABLET | Freq: Every day | ORAL | Status: DC
  Administered 2015-11-28 – 2015-11-30 (×3): 40 mg via ORAL
  Filled 2015-11-28 (×3): qty 1

## 2015-11-28 MED ORDER — SENNA 8.6 MG PO TABS: 1.0000 | ORAL_TABLET | Freq: Two times a day (BID) | ORAL | Status: DC

## 2015-11-28 MED ORDER — MAGIC MOUTHWASH
5.0000 mL | Freq: Four times a day (QID) | ORAL | Status: DC
  Administered 2015-11-28 – 2015-12-15 (×50): 5 mL via ORAL
  Filled 2015-11-28 (×70): qty 5

## 2015-11-28 MED ORDER — PREDNISONE 20 MG PO TABS
50.0000 mg | ORAL_TABLET | Freq: Every day | ORAL | Status: DC
  Administered 2015-11-28: 50 mg via ORAL
  Filled 2015-11-28: qty 2

## 2015-11-28 MED ORDER — IPRATROPIUM BROMIDE 0.02 % IN SOLN
0.5000 mg | Freq: Once | RESPIRATORY_TRACT | Status: AC
  Administered 2015-11-28: 0.5 mg via RESPIRATORY_TRACT
  Filled 2015-11-28: qty 2.5

## 2015-11-28 MED ORDER — OXYBUTYNIN CHLORIDE 5 MG PO TABS
10.0000 mg | ORAL_TABLET | Freq: Two times a day (BID) | ORAL | Status: DC
  Administered 2015-11-28 – 2015-12-10 (×25): 10 mg via ORAL
  Filled 2015-11-28 (×26): qty 2

## 2015-11-28 NOTE — Evaluation (Signed)
Physical Therapy Evaluation Patient Details Name: Gina Bender MRN: [id number] DOB: 1950-12-11 Today's Date: 11/28/2015   History of Present Illness  64 y.o. female admitted to [facility] on 11/28/15 for SOB.  Dx with asthma exacerbation.  Pt wtih significant PMHx of back pain, asthma, DM, HTN, fibromyalgia, CA, recurrent UTIs and urinary urgency, morbid obesity, bil knee arthroscopy, and back surgery.  Of note, pt reports recent fall to right side and buttocks with inability to move well at home (only able to go to the bathroom, was not eating) since the fall. Per MD note there is no fracture.    Clinical Impression  Pt is very weak and deconditioned. She is unable to care for herself at home at this time and reports she is even having difficulty getting to the bathroom.  She would benefit from SNF for rehab at discharge.   PT to follow acutely for deficits listed below.       Follow Up Recommendations SNF    Equipment Recommendations  None recommended by PT    Recommendations for Other Services   NA    Precautions / Restrictions Precautions Precautions: Fall Precaution Comments: hitory of recent fall      Mobility  Bed Mobility Overal bed mobility: Needs Assistance Bed Mobility: Rolling;Sidelying to Sit Rolling: Min assist Sidelying to sit: Min assist;HOB elevated       General bed mobility comments: Min assist to help pt progress to EOB by supporting trunk and assisting in her recahing for bed rail.  HOB elelvated ~30 degrees  Transfers Overall transfer level: Needs assistance Equipment used: Rolling walker (2 wheeled) Transfers: Sit to/from Stand Sit to Stand: Min assist;From elevated surface         General transfer comment: Min assist from elevated bed.  Verbal cues for safe hand placement.  Pt was unable to fit both knees inside of RW due to body habitus and had to stradle the walker to stand.   Ambulation/Gait Ambulation/Gait assistance: Min assist Ambulation  Distance (Feet): 3 Feet Assistive device: Rolling walker (2 wheeled) Gait Pattern/deviations: Step-to pattern;Antalgic     General Gait Details: Pivotal steps from bed to recliner chair. Pt did not feel confident enough to walk as she is fearful of falling.          Balance Overall balance assessment: Needs assistance Sitting-balance support: Feet supported;No upper extremity supported Sitting balance-Leahy Scale: Good     Standing balance support: Bilateral upper extremity supported Standing balance-Leahy Scale: Poor                               Pertinent Vitals/Pain Pain Assessment: Faces Faces Pain Scale: Hurts even more Pain Location: back and right hip fell tuesday and hurt that side Pain Descriptors / Indicators: Aching;Burning Pain Intervention(s): Limited activity within patient's tolerance;Monitored during session;Repositioned    Home Living Family/patient expects to be discharged to:: Private residence Living Arrangements: Alone Available Help at Discharge: Other (Comment) (none) Type of Home: House Home Access: Stairs to enter Entrance Stairs-Rails: None Entrance Stairs-Number of Steps: 2 Home Layout: One level Home Equipment: Walker - 4 wheels;Cane - quad Additional Comments: Difficult history due to pt unable to stay awake to answer questions    Prior Function Level of Independence: Independent with assistive device(s)         Comments: uses RW, drives, does not use O2 at home      Hand Dominance   Dominant  Hand: Right    Extremity/Trunk Assessment   Upper Extremity Assessment: Defer to OT evaluation           Lower Extremity Assessment: Generalized weakness      Cervical / Trunk Assessment: Other exceptions  Communication   Communication: No difficulties  Cognition Arousal/Alertness: Lethargic Behavior During Therapy: WFL for tasks assessed/performed Overall Cognitive Status: Difficult to assess                       General Comments General comments (skin integrity, edema, etc.): Significant increase in DOE with mobility and transfers, however, pt never dropped below 90% even on RA.            Assessment/Plan    PT Assessment Patient needs continued PT services  PT Diagnosis Difficulty walking;Abnormality of gait;Generalized weakness   PT Problem List Decreased strength;Decreased activity tolerance;Decreased mobility;Decreased balance;Decreased knowledge of use of DME;Cardiopulmonary status limiting activity;Obesity;Decreased skin integrity;Pain  PT Treatment Interventions DME instruction;Gait training;Functional mobility training;Therapeutic activities;Therapeutic exercise;Balance training;Neuromuscular re-education;Stair training;Patient/family education   PT Goals (Current goals can be found in the Care Plan section) Acute Rehab PT Goals Patient Stated Goal: to get better and not fall again PT Goal Formulation: With patient Time For Goal Achievement: 12/12/15 Potential to Achieve Goals: Good    Frequency Min 3X/week   Barriers to discharge Decreased caregiver support pt reports she has no one who can help       End of Session Equipment Utilized During Treatment: Oxygen Activity Tolerance: Patient limited by fatigue;Patient limited by pain (limited by DOE with mobility) Patient left: in chair;with call bell/phone within reach;with chair alarm set      Functional Assessment Tool Used: assist level Functional Limitation: Mobility: Walking and moving around Mobility: Walking and Moving Around Current Status [id number]): At least 20 percent but less than 40 percent impaired, limited or restricted Mobility: Walking and Moving Around Goal Status [phone]): At least 1 percent but less than 20 percent impaired, limited or restricted    Time: [id number] PT Time Calculation (min) (ACUTE ONLY): 36 min   Charges:   PT Evaluation $PT Eval Moderate Complexity: 1 Procedure PT  Treatments $Therapeutic Activity: 8-22 mins   PT G Codes:   PT G-Codes **NOT FOR INPATIENT CLASS** Functional Assessment Tool Used: assist level Functional Limitation: Mobility: Walking and moving around Mobility: Walking and Moving Around Current Status [id number]): At least 20 percent but less than 40 percent impaired, limited or restricted Mobility: Walking and Moving Around Goal Status [phone]): At least 1 percent but less than 20 percent impaired, limited or restricted    Everlee Quakenbush B. [place], [place], DPT [phone]   11/28/2015, 12:34 PM

## 2015-11-28 NOTE — Progress Notes (Signed)
Patient seen and evaluated earlier this a.m. by my associate. Presenting with asthma exacerbation. Please refer to H&P for details regarding assessment and plan. Patient will be placed on higher dose steroid regimen she continues to have wheezes. No new complaints otherwise reported feels a little better than when she was first seen.  Gen.: Patient in no acute distress, alert and awake Cardiovascular: S1 and S2 within normal limits, no rubs Pulmonary: Diffuse expiratory wheezes bilaterally, equal chest rise, speaking in broken sentences, nasal cannula in place   We'll plan on evaluating A.m. or sooner should medical condition worsen.  We'll plan on discontinuing prednisone placing on Solu-Medrol  Gina Bender
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
## 2015-11-28 NOTE — ED Provider Notes (Signed)
By signing my name below, I, Randa Evens, attest that this documentation has been prepared under the direction and in the presence of [employer], DO. Electronically Signed: Randa Evens, ED Scribe. 11/28/2015. 1:57 AM.  TIME SEEN: 1:49 AM  CHIEF COMPLAINT: Multiple complaints  HPI: HPI Comments: Gina Bender is a 65 y.o. female with PMHx of asthma, hypertension, fibromyalgia, diabetes brought in by ambulance, who presents to the Emergency Department complaining of SOB onset 2 days prior that has recently worsened today. Pt reports associated cough and wheezing. Pt reports that she has been using her inhaler with no relief. Pt also received albuterol in route. Denies tobacco use. Denies HX of COPD.    Reports HX of C.diff and has been having diarrhea for the past 2 weeks. Family member states that she had not had a BM in several days however. States that she was started on antibiotic by her primary care provider this week for C. difficile. Denies abdominal pain. No vomiting. States that no stool cultures were ever obtained. Family is unclear on how she was diagnosed with C. difficile.   Pt also reports some right hip pain. States she had a fall the beginning of this week. States she did not hit her head. She is normally able to ambulate with a walker. Family states that she is not able to ambulate on her own. Family states that she has been struggling to take care of her self recently.  Family feels that she has been more confused over the past several months. No focal neurologic deficits either.  PCP is with [facility]  ROS: See HPI Constitutional: no fever  Eyes: no drainage  ENT: no runny nose   Cardiovascular:  no chest pain  Resp:  SOB  GI: no vomiting GU: no dysuria Integumentary: no rash  Allergy: no hives  Musculoskeletal: leg swelling  Neurological: no slurred speech ROS otherwise negative  PAST MEDICAL HISTORY/PAST SURGICAL HISTORY:  Past Medical History   Diagnosis Date  . Back pain   . Arthritis   . Asthma   . Diabetes mellitus without complication ([place])   . Hypertension   . Fibromyalgia   . Thyroid disease   . Cancer (HCC)     Breast  . Splenic laceration   . Sleep apnea   . Pneumonia   . Bronchitis   . History of recurrent UTIs   . History of urinary urgency   . 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 See allergy list.  . PONV (postoperative nausea and vomiting)   . Difficult intubation     Pt reports that she has been told "a couple times" that she was difficult to intubate, glidescope used in 2006    MEDICATIONS:  Prior to Admission medications   Medication Sig Start Date End Date Taking? Authorizing Provider  aspirin EC 81 MG tablet Take 81 mg by mouth daily.    Historical Provider, MD  atenolol (TENORMIN) 50 MG tablet Take 1 tablet (50 mg total) by mouth daily. 08/21/15   Marletta Lor, MD  bacitracin ophthalmic ointment Place 1 application into the left eye at bedtime. Reported on 11/16/2015 02/17/15   Historical Provider, MD  betamethasone valerate ointment (VALISONE) 0.1 % Apply a pea sized amount topically BID for up to 2 weeks 09/01/15   Salvadore Dom, MD  calcium-vitamin D (OSCAL WITH D) 500-200 MG-UNIT per tablet Take 2 tablets by  mouth daily with breakfast.    Historical Provider, MD  cloNIDine (CATAPRES) 0.1 MG tablet Take 1 tablet (0.1 mg total) by mouth 2 (two) times daily. 08/21/15   Marletta Lor, MD  furosemide (LASIX) 40 MG tablet Take 1 tablet (40 mg total) by mouth daily. 08/21/15   Marletta Lor, MD  gabapentin (NEURONTIN) 300 MG capsule Take 1 capsule (300 mg total) by mouth 2 (two) times daily. 08/21/15   Marletta Lor, MD  glucose blood (FREESTYLE LITE) test strip 100 each by Does not apply route.    Historical Provider, MD  glucose blood (FREESTYLE LITE) test strip by Does not apply route.    Historical Provider, MD   Insulin Lispro Prot & Lispro (HUMALOG MIX 75/25 KWIKPEN) (75-25) 100 UNIT/ML Kwikpen Inject 26 Units into the skin 2 (two) times daily. 08/21/15   Marletta Lor, MD  Insulin Pen Needle (PRODIGY MINI PEN NEEDLES) 31G X 5 MM MISC USE 2 TIMES A DAY 11/05/13   Historical Provider, MD  levothyroxine (SYNTHROID, LEVOTHROID) 175 MCG tablet Take 1 tablet (175 mcg total) by mouth daily. 08/21/15   Marletta Lor, MD  losartan (COZAAR) 100 MG tablet Take 1 tablet (100 mg total) by mouth daily. 08/21/15   Marletta Lor, MD  metFORMIN (GLUCOPHAGE) 1000 MG tablet Take 1 tablet (1,000 mg total) by mouth 2 (two) times daily. 08/21/15   Marletta Lor, MD  metroNIDAZOLE (FLAGYL) 500 MG tablet Take 1 tablet (500 mg total) by mouth 3 (three) times daily. 11/16/15   Laurey Morale, MD  Multiple Vitamins-Minerals (MULTIVITAMIN WITH MINERALS) tablet Take 1 tablet by mouth daily.    Historical Provider, MD  nitrofurantoin, macrocrystal-monohydrate, (MACROBID) 100 MG capsule Take one capsule BID x 7 days 09/01/15   Salvadore Dom, MD  Omega-3 Fatty Acids (OMEGA 3 PO) Take 1 tablet by mouth daily.    Historical Provider, MD  oxybutynin (DITROPAN) 5 MG tablet Take 2 tablets (10 mg total) by mouth 2 (two) times daily. 08/24/15   Marletta Lor, MD  PARoxetine (PAXIL) 40 MG tablet Take 1 tablet (40 mg total) by mouth daily. 08/21/15   Marletta Lor, MD  tiZANidine (ZANAFLEX) 2 MG tablet Take 1 tablet (2 mg total) by mouth every 8 (eight) hours as needed for muscle spasms. 01/22/15   Marletta Lor, MD  VENTOLIN HFA 108 [phone] Base) MCG/ACT inhaler Inhale 2 puffs into the lungs every 6 (six) hours as needed. Shortness of breath 11/24/15   Marletta Lor, MD    ALLERGIES:  Allergies  Allergen Reactions  . Hydrocodone-Acetaminophen     REACTION: nausea and vomiting  . Latex Dermatitis  . Oxycodone-Acetaminophen     REACTION: hives, nausea and vomiting  . Pollen Extract   .  Succinylcholine     REACTION: heart stopped    SOCIAL HISTORY:  Social History  Substance Use Topics  . Smoking status: Never Smoker   . Smokeless tobacco: Never Used  . Alcohol Use: No    FAMILY HISTORY: Family History  Problem Relation Age of Onset  . Hypertension Mother   . Stroke Mother   . Cancer Father   . Lung cancer Father   . Breast cancer Sister     EXAM: BP 122/57 mmHg  Pulse 72  Temp(Src) 98.8 F (37.1 C) (Oral)  Resp 22  Ht 5\' 6"  (1.676 m)  Wt 305 lb (138.347 kg)  BMI 49.25 kg/m2  SpO2 100%  CONSTITUTIONAL: Alert and oriented and responds appropriately to questions. Morbidly obese, chronically ill-appearing, in mild respiratory distress anytime she tries to move around HEAD: Normocephalic EYES: Conjunctivae clear, PERRL ENT: normal nose; no rhinorrhea; moist mucous membranes NECK: Supple, no meningismus, no LAD; no midline spinal tenderness or step-off or deformity CARD: RRR; S1 and S2 appreciated; no murmurs, no clicks, no rubs, no gallops RESP: Normal chest excursion without splinting, patient is tachypneic, has diminished breath sounds diffusely with expiratory wheezing and rhonchorous breath sounds, hypoxic per EMS, speaking short sentences ABD/GI: Normal bowel sounds; non-distended; soft, non-tender, no rebound, no guarding, no peritoneal signs RECTAL:  Patient has multiple superficial noninfected wounds around the buttocks and anus with this running erythema or warmth. No drainage. Rectal temperature is 98.1. No gross blood or melena. BACK:  The back appears normal and is non-tender to palpation, there is no CVA tenderness; no midline spinal tenderness or step-off or deformity EXT: Reports tenderness over the right hip without obvious deformity but exam is limited due to her morbid obesity. There is no leg length discrepancy. Normal ROM in all joints; non-tender to palpation; patient has bilateral lower extremity pitting edema and venous stasis  dermatitis; normal capillary refill; no cyanosis, no calf tenderness or swelling    SKIN: Normal color for age and race; warm; no rash NEURO: Moves all extremities equally, sensation to light touch intact diffusely, cranial nerves II through XII intact PSYCH: The patient's mood and manner are appropriate. Grooming and personal hygiene are appropriate.  MEDICAL DECISION MAKING: Patient here with multiple different complaints. Her most emergent complaint seems to be her asthma exacerbation. She seems to be in mild distress whenever she is moving about the bed. Family also reports her recent fall several days ago. She did not hit her head but is complaining of right hip pain. We'll obtain x-ray. Family is also concerned about intermittent confusion that she may have a urinary tract infection. We'll obtain labs, urine, chest x-ray to evaluate for organic cause for her symptoms. No focal neurological deficits on exam. At this time I do not feel she needs a head CT. I will give her continuous albuterol as well as Solu-Medrol. She is denying any chest pain currently. She did state that after she fell she laid on the ground for several hours. Will obtain a CK level.  I have also ordered a C. difficile study per family's request. Discussed with her that I doubt that she has C. difficile if she has not had diarrhea and several days. She is currently on enteric precautions.  ED PROGRESS: Patient's labs are unremarkable. Troponin is negative. BNP is mildly elevated 256. CK is mildly elevated at 379. Creatinine also mildly elevated at 1.38. Chest x-ray shows no infiltrate, edema or pneumothorax. Right hip x-ray shows no acute abnormality. Patient is still short of breath that has better aeration on exam. Given she is still having a moderate asthma exacerbation a feel she will need admission. Discuss with Dr. Alcario Drought with hospitalist service who agrees with plan for admission. He recommends admission to medical bed,  observation. Care transferred to hospitalist service. Her urinalysis is still pending. Hospitalist to further manage patient's care. Patient and family updated with plan.     EKG Interpretation  Date/Time:  Saturday November 28 2015 01:33:58 EDT Ventricular Rate:  76 PR Interval:  152 QRS Duration: 88 QT Interval:  429 QTC Calculation: 482 R Axis:   19 Text Interpretation:  Sinus rhythm Ventricular premature complex Low voltage,  precordial leads No significant change since last tracing Confirmed by Gabrien Mentink,  DO, Darrah Dredge [phone]) on 11/28/2015 1:43:12 AM       I personally performed the services described in this documentation, which was scribed in my presence. The recorded information has been reviewed and is accurate.   [place], DO 11/28/15 0800

## 2015-11-28 NOTE — ED Notes (Addendum)
Per EMS, pt began feeling SOB today with no relief from inhaler at home. Dx with C.diff 3wks ago, completed treatment. Received 10mg  albuterol from EMS en route. Rhonchi noted in the bases, with wheezing after exertion.

## 2015-11-28 NOTE — ED Notes (Signed)
Patient arrives with complaints of shortness of breath. Currently completing breathing treatment by ems. Hx of asthma.

## 2015-11-28 NOTE — ED Notes (Signed)
Patient transported to X-ray 

## 2015-11-28 NOTE — Consult Note (Signed)
WOC wound consult note Reason for Consult:Evaluation for pressure injuries vs incontinence associated dermatitis (IAD). Patient also complains of oral lesions during this visit; bedside RN to report to hospitalist today. Patient seen with the assistance of PT and bedside RN as they are assisting her to get OOB. Wound type: Moisture associated skin damage (MASD), specifically incontinence associated dermatitis in conjunction with friction. Pressure Ulcer POA: No Measurement:Left side gluteal cleft: 2.4cm x 1.2cm x 0.2cm partial thickness area of skin loss. Wound bed is pink to light yellow with basement membrane evident.  Right side of gluteal cleft: Two areas of tissue loss (partial thickness), medial measures 1cm x 0.8cm x 0.2cm and lateral measures 0.8cm x 0.5cm x 0.2cm. Wound bed is pink, moist and presents with a scant amount of serosanguinous exudate. Patient reports both lesions are painful until covered. Wound [id number], shallow moist lesions. Drainage (amount, consistency, odor) small amount of serous exudate from the bilateral lesions in the area of the gluteal cleft Periwound:Intact.  An ecchymotic area in the right sacral region exists as a result of the traumatic fall. No induration, warmth or fluctuance noted, no open areas. Dressing procedure/placement/frequency: Patient is provided with a bariatric bed with low air loss feature to assist in the management of microclimate (moisture) and friction.  Nursing is provided with guidance for turning and repositioning.  A pressure redistribution chair pad is provided for use while patient is OOB in chair. Soft silicone dressings are provided to pad, protect and enhance reepithelialization in the sacral and bilateral gluteal cleft affected areas. If urinary incontinence continuously soils dressings, these can be discontinued in favor of covering open areas with a clear skin barrier (Critic Aid Clear) which is available on the unit and can be implemented  without an order.  If strides toward resolution of this problem are not witnessed via those means, a short term intervention of an indwelling urinary catheter may be indicated until open areas resolve. [facility] nursing team will not follow, but will remain available to this patient, the nursing and medical teams.  Please re-consult if needed. Thanks, Maudie Flakes, MSN, RN, [place], Arther Abbott  Pager# [phone]

## 2015-11-29 ENCOUNTER — Observation Stay (HOSPITAL_COMMUNITY): Payer: Medicare Other

## 2015-11-29 DIAGNOSIS — J690 Pneumonitis due to inhalation of food and vomit: Secondary | ICD-10-CM | POA: Diagnosis not present

## 2015-11-29 DIAGNOSIS — E876 Hypokalemia: Secondary | ICD-10-CM | POA: Diagnosis present

## 2015-11-29 DIAGNOSIS — T380X5A Adverse effect of glucocorticoids and synthetic analogues, initial encounter: Secondary | ICD-10-CM | POA: Diagnosis present

## 2015-11-29 DIAGNOSIS — G4733 Obstructive sleep apnea (adult) (pediatric): Secondary | ICD-10-CM | POA: Diagnosis present

## 2015-11-29 DIAGNOSIS — M1990 Unspecified osteoarthritis, unspecified site: Secondary | ICD-10-CM | POA: Diagnosis present

## 2015-11-29 DIAGNOSIS — E119 Type 2 diabetes mellitus without complications: Secondary | ICD-10-CM | POA: Diagnosis not present

## 2015-11-29 DIAGNOSIS — M6289 Other specified disorders of muscle: Secondary | ICD-10-CM | POA: Diagnosis not present

## 2015-11-29 DIAGNOSIS — R05 Cough: Secondary | ICD-10-CM | POA: Diagnosis not present

## 2015-11-29 DIAGNOSIS — R32 Unspecified urinary incontinence: Secondary | ICD-10-CM | POA: Diagnosis not present

## 2015-11-29 DIAGNOSIS — R0600 Dyspnea, unspecified: Secondary | ICD-10-CM | POA: Diagnosis not present

## 2015-11-29 DIAGNOSIS — G8191 Hemiplegia, unspecified affecting right dominant side: Secondary | ICD-10-CM | POA: Diagnosis not present

## 2015-11-29 DIAGNOSIS — E1165 Type 2 diabetes mellitus with hyperglycemia: Secondary | ICD-10-CM | POA: Diagnosis present

## 2015-11-29 DIAGNOSIS — Z7982 Long term (current) use of aspirin: Secondary | ICD-10-CM | POA: Diagnosis not present

## 2015-11-29 DIAGNOSIS — I110 Hypertensive heart disease with heart failure: Secondary | ICD-10-CM | POA: Diagnosis present

## 2015-11-29 DIAGNOSIS — I4581 Long QT syndrome: Secondary | ICD-10-CM | POA: Diagnosis not present

## 2015-11-29 DIAGNOSIS — M25551 Pain in right hip: Secondary | ICD-10-CM | POA: Diagnosis present

## 2015-11-29 DIAGNOSIS — Z91048 Other nonmedicinal substance allergy status: Secondary | ICD-10-CM | POA: Diagnosis not present

## 2015-11-29 DIAGNOSIS — W19XXXA Unspecified fall, initial encounter: Secondary | ICD-10-CM | POA: Diagnosis present

## 2015-11-29 DIAGNOSIS — Z91040 Latex allergy status: Secondary | ICD-10-CM | POA: Diagnosis not present

## 2015-11-29 DIAGNOSIS — Z885 Allergy status to narcotic agent status: Secondary | ICD-10-CM | POA: Diagnosis not present

## 2015-11-29 DIAGNOSIS — I509 Heart failure, unspecified: Secondary | ICD-10-CM | POA: Diagnosis not present

## 2015-11-29 DIAGNOSIS — Z853 Personal history of malignant neoplasm of breast: Secondary | ICD-10-CM | POA: Diagnosis not present

## 2015-11-29 DIAGNOSIS — J45901 Unspecified asthma with (acute) exacerbation: Secondary | ICD-10-CM | POA: Diagnosis not present

## 2015-11-29 DIAGNOSIS — J9811 Atelectasis: Secondary | ICD-10-CM | POA: Diagnosis not present

## 2015-11-29 DIAGNOSIS — E662 Morbid (severe) obesity with alveolar hypoventilation: Secondary | ICD-10-CM | POA: Diagnosis present

## 2015-11-29 DIAGNOSIS — I10 Essential (primary) hypertension: Secondary | ICD-10-CM | POA: Diagnosis not present

## 2015-11-29 DIAGNOSIS — R0602 Shortness of breath: Secondary | ICD-10-CM | POA: Diagnosis not present

## 2015-11-29 DIAGNOSIS — E875 Hyperkalemia: Secondary | ICD-10-CM | POA: Diagnosis present

## 2015-11-29 DIAGNOSIS — M6281 Muscle weakness (generalized): Secondary | ICD-10-CM | POA: Diagnosis not present

## 2015-11-29 DIAGNOSIS — Z888 Allergy status to other drugs, medicaments and biological substances status: Secondary | ICD-10-CM | POA: Diagnosis not present

## 2015-11-29 DIAGNOSIS — J101 Influenza due to other identified influenza virus with other respiratory manifestations: Secondary | ICD-10-CM | POA: Diagnosis not present

## 2015-11-29 DIAGNOSIS — M797 Fibromyalgia: Secondary | ICD-10-CM | POA: Diagnosis present

## 2015-11-29 DIAGNOSIS — Z23 Encounter for immunization: Secondary | ICD-10-CM | POA: Diagnosis not present

## 2015-11-29 DIAGNOSIS — Z794 Long term (current) use of insulin: Secondary | ICD-10-CM | POA: Diagnosis not present

## 2015-11-29 DIAGNOSIS — N179 Acute kidney failure, unspecified: Secondary | ICD-10-CM | POA: Diagnosis present

## 2015-11-29 DIAGNOSIS — R197 Diarrhea, unspecified: Secondary | ICD-10-CM | POA: Diagnosis present

## 2015-11-29 DIAGNOSIS — Z6841 Body Mass Index (BMI) 40.0 and over, adult: Secondary | ICD-10-CM | POA: Diagnosis not present

## 2015-11-29 DIAGNOSIS — N390 Urinary tract infection, site not specified: Secondary | ICD-10-CM | POA: Diagnosis not present

## 2015-11-29 DIAGNOSIS — Z9119 Patient's noncompliance with other medical treatment and regimen: Secondary | ICD-10-CM | POA: Diagnosis not present

## 2015-11-29 DIAGNOSIS — Z7401 Bed confinement status: Secondary | ICD-10-CM | POA: Diagnosis not present

## 2015-11-29 DIAGNOSIS — R930 Abnormal findings on diagnostic imaging of skull and head, not elsewhere classified: Secondary | ICD-10-CM | POA: Diagnosis not present

## 2015-11-29 DIAGNOSIS — J383 Other diseases of vocal cords: Secondary | ICD-10-CM | POA: Diagnosis not present

## 2015-11-29 DIAGNOSIS — F39 Unspecified mood [affective] disorder: Secondary | ICD-10-CM | POA: Diagnosis present

## 2015-11-29 DIAGNOSIS — E039 Hypothyroidism, unspecified: Secondary | ICD-10-CM | POA: Diagnosis present

## 2015-11-29 DIAGNOSIS — G9340 Encephalopathy, unspecified: Secondary | ICD-10-CM | POA: Diagnosis not present

## 2015-11-29 DIAGNOSIS — R509 Fever, unspecified: Secondary | ICD-10-CM | POA: Diagnosis not present

## 2015-11-29 DIAGNOSIS — Z79899 Other long term (current) drug therapy: Secondary | ICD-10-CM | POA: Diagnosis not present

## 2015-11-29 DIAGNOSIS — R531 Weakness: Secondary | ICD-10-CM | POA: Diagnosis not present

## 2015-11-29 DIAGNOSIS — E860 Dehydration: Secondary | ICD-10-CM | POA: Diagnosis present

## 2015-11-29 DIAGNOSIS — Z9841 Cataract extraction status, right eye: Secondary | ICD-10-CM | POA: Diagnosis not present

## 2015-11-29 DIAGNOSIS — R0902 Hypoxemia: Secondary | ICD-10-CM | POA: Diagnosis not present

## 2015-11-29 DIAGNOSIS — I5033 Acute on chronic diastolic (congestive) heart failure: Secondary | ICD-10-CM | POA: Diagnosis not present

## 2015-11-29 DIAGNOSIS — Z90710 Acquired absence of both cervix and uterus: Secondary | ICD-10-CM | POA: Diagnosis not present

## 2015-11-29 DIAGNOSIS — K5900 Constipation, unspecified: Secondary | ICD-10-CM | POA: Diagnosis present

## 2015-11-29 DIAGNOSIS — J9601 Acute respiratory failure with hypoxia: Secondary | ICD-10-CM | POA: Diagnosis not present

## 2015-11-29 DIAGNOSIS — Z823 Family history of stroke: Secondary | ICD-10-CM | POA: Diagnosis not present

## 2015-11-29 DIAGNOSIS — Z87440 Personal history of urinary (tract) infections: Secondary | ICD-10-CM | POA: Diagnosis not present

## 2015-11-29 DIAGNOSIS — R0789 Other chest pain: Secondary | ICD-10-CM | POA: Diagnosis not present

## 2015-11-29 DIAGNOSIS — G894 Chronic pain syndrome: Secondary | ICD-10-CM | POA: Diagnosis present

## 2015-11-29 DIAGNOSIS — R278 Other lack of coordination: Secondary | ICD-10-CM | POA: Diagnosis present

## 2015-11-29 DIAGNOSIS — I63512 Cerebral infarction due to unspecified occlusion or stenosis of left middle cerebral artery: Secondary | ICD-10-CM | POA: Diagnosis not present

## 2015-11-29 DIAGNOSIS — E1140 Type 2 diabetes mellitus with diabetic neuropathy, unspecified: Secondary | ICD-10-CM | POA: Diagnosis not present

## 2015-11-29 DIAGNOSIS — R1311 Dysphagia, oral phase: Secondary | ICD-10-CM | POA: Diagnosis not present

## 2015-11-29 LAB — GLUCOSE, CAPILLARY
GLUCOSE-CAPILLARY: 144 mg/dL — AB (ref 65–99)
GLUCOSE-CAPILLARY: 168 mg/dL — AB (ref 65–99)
Glucose-Capillary: 167 mg/dL — ABNORMAL HIGH (ref 65–99)
Glucose-Capillary: 149 mg/dL — ABNORMAL HIGH (ref 65–99)

## 2015-11-29 MED ORDER — DOXYCYCLINE HYCLATE 100 MG IV SOLR
100.0000 mg | Freq: Two times a day (BID) | INTRAVENOUS | Status: DC
  Administered 2015-11-29 – 2015-11-30 (×2): 100 mg via INTRAVENOUS
  Filled 2015-11-29 (×3): qty 100

## 2015-11-29 MED ORDER — METHYLPREDNISOLONE SODIUM SUCC 125 MG IJ SOLR
60.0000 mg | Freq: Three times a day (TID) | INTRAMUSCULAR | Status: DC
  Administered 2015-11-29 – 2015-11-30 (×2): 60 mg via INTRAVENOUS
  Filled 2015-11-29 (×2): qty 2

## 2015-11-29 MED ORDER — DEXTROSE 5 % IV SOLN
2.0000 g | INTRAVENOUS | Status: DC
  Administered 2015-11-29 – 2015-12-02 (×4): 2 g via INTRAVENOUS
  Filled 2015-11-29 (×4): qty 2

## 2015-11-29 NOTE — Evaluation (Signed)
Occupational Therapy Evaluation Patient Details Name: Gina Bender MRN: [id number] DOB: 1951-05-02 Today's Date: 11/29/2015    History of Present Illness 65 y.o. female admitted to [facility] on 11/28/15 for SOB.  Dx with asthma exacerbation.  Pt wtih significant PMHx of back pain, asthma, DM, HTN, fibromyalgia, CA, recurrent UTIs and urinary urgency, morbid obesity, bil knee arthroscopy, and back surgery.  Of note, pt reports recent fall to right side and buttocks with inability to move well at home (only able to go to the bathroom, was not eating) since the fall. Per MD note there is no fracture.     Clinical Impression   Pt reports she was independent with ADLs PTA. Currently pt requires min assist for sit to stand and side stepping at EOB and seated ADLs. Pt requires max assist for LB ADLs at this time. Pt with hx of recent falls and she is hesitant to try mobility secondary to fear of falling again. Recommending SNF for follow up to maximize independence and safety with ADLs and functional mobility prior to return home. Pt would benefit from continued skilled OT to address established goals.    Follow Up Recommendations  SNF;Supervision/Assistance - 24 hour    Equipment Recommendations  Other (comment) (TBD)    Recommendations for Other Services       Precautions / Restrictions Precautions Precautions: Fall Precaution Comments: hitory of recent fall Restrictions Weight Bearing Restrictions: No      Mobility Bed Mobility Overal bed mobility: Needs Assistance Bed Mobility: Supine to Sit;Sit to Supine     Supine to sit: Min assist;HOB elevated (assist at trunk for balance) Sit to supine: Mod assist (assist to manage LEs into bed and for repositioning)   General bed mobility comments: heavy use of bed rails. VCs throughout for technique.  Transfers Overall transfer level: Needs assistance Equipment used: Rolling walker (2 wheeled) Transfers: Sit to/from Stand Sit to  Stand: Min assist         General transfer comment: Min assist for sit to stand x 2 from EOB. VCs for hand placement. Pt able to take side steps x 5 for repositioning closer to [facility].    Balance Overall balance assessment: Needs assistance;History of Falls Sitting-balance support: Bilateral upper extremity supported;Feet supported Sitting balance-Leahy Scale: Fair     Standing balance support: Bilateral upper extremity supported Standing balance-Leahy Scale: Poor Standing balance comment: RW for support                            ADL Overall ADL's : Needs assistance/impaired Eating/Feeding: Set up;Sitting   Grooming: Minimal assistance;Sitting   Upper Body Bathing: Minimal assitance;Sitting   Lower Body Bathing: Maximal assistance;Sit to/from stand   Upper Body Dressing : Minimal assistance;Sitting   Lower Body Dressing: Maximal assistance;Sit to/from stand   Toilet Transfer: Minimal assistance;Stand-pivot;BSC;RW Toilet Transfer Details (indicate cue type and reason): Simulated by sit to stand from EOB. Toileting- Clothing Manipulation and Hygiene: Minimal assistance;Sit to/from stand       Functional mobility during ADLs: Minimal assistance;Rolling walker (for side stepping for repositioning in bed) General ADL Comments: Pt reports she is very nervous about falling and does not want to attempt transfers away from the bed at this time. Performed side stepping to reposition in bed. Discussed post acute rehab; pt feels that she needs to go somewhere to get stronger prior to returning home.     Vision Vision Assessment?: No apparent visual deficits  Perception     Praxis      Pertinent Vitals/Pain Pain Assessment: Faces Faces Pain Scale: Hurts little more Pain Location: buttocks and back Pain Descriptors / Indicators: Grimacing;Sore Pain Intervention(s): Limited activity within patient's tolerance;Monitored during session;Repositioned     Hand  Dominance Right   Extremity/Trunk Assessment Upper Extremity Assessment Upper Extremity Assessment: Generalized weakness   Lower Extremity Assessment Lower Extremity Assessment: Defer to PT evaluation   Cervical / Trunk Assessment Cervical / Trunk Assessment: Other exceptions Cervical / Trunk Exceptions: h/o back pain and surgery, obesity   Communication Communication Communication: No difficulties   Cognition Arousal/Alertness: Awake/alert Behavior During Therapy: WFL for tasks assessed/performed Overall Cognitive Status: Within Functional Limits for tasks assessed                     General Comments       Exercises       Shoulder Instructions      Home Living Family/patient expects to be discharged to:: Skilled nursing facility                                        Prior Functioning/Environment Level of Independence: Independent with assistive device(s)        Comments: uses RW, drives, does not use O2 at home     OT Diagnosis: Generalized weakness;Acute pain   OT Problem List: Decreased strength;Decreased range of motion;Decreased activity tolerance;Impaired balance (sitting and/or standing);Decreased safety awareness;Decreased knowledge of use of DME or AE;Decreased knowledge of precautions;Obesity   OT Treatment/Interventions: Self-care/ADL training;Therapeutic exercise;Energy conservation;DME and/or AE instruction;Therapeutic activities;Patient/family education;Balance training    OT Goals(Current goals can be found in the care plan section) Acute Rehab OT Goals Patient Stated Goal: no more falls OT Goal Formulation: With patient Time For Goal Achievement: 12/13/15 Potential to Achieve Goals: Good ADL Goals Pt Will Perform Grooming: with modified independence;sitting Pt Will Perform Upper Body Bathing: with modified independence;sitting Pt Will Perform Lower Body Bathing: with min assist;sit to/from stand Pt Will Transfer to  Toilet: with min assist;ambulating;bedside commode Pt Will Perform Toileting - Clothing Manipulation and hygiene: sit to/from stand;with min guard assist Pt/caregiver will Perform Home Exercise Program: Increased strength;Both right and left upper extremity;With theraband;Independently;With written HEP provided  OT Frequency: Min 2X/week   Barriers to D/C:            Co-evaluation              End of Session Equipment Utilized During Treatment: Gait belt;Rolling walker;Oxygen Nurse Communication: Mobility status  Activity Tolerance: Patient limited by fatigue Patient left: in bed;with call bell/phone within reach   Time: 0921-0952 OT Time Calculation (min): 31 min Charges:  OT General Charges $OT Visit: 1 Procedure OT Evaluation $OT Eval Moderate Complexity: 1 Procedure OT Treatments $Therapeutic Activity: 8-22 mins G-Codes: OT G-codes **NOT FOR INPATIENT CLASS** Functional Assessment Tool Used: Clinical judgement Functional Limitation: Self care Self Care Current Status [id number]): At least 40 percent but less than 60 percent impaired, limited or restricted Self Care Goal Status [id number]): At least 20 percent but less than 40 percent impaired, limited or restricted   Binnie Kand M.S., OTR/L Pager: [phone]  11/29/2015, 11:26 AM

## 2015-11-29 NOTE — Progress Notes (Signed)
ANTIBIOTIC CONSULT NOTE - INITIAL  Pharmacy Consult for Ceftriaxone Indication: UTI  Allergies  Allergen Reactions  . Hydrocodone-Acetaminophen     REACTION: nausea and vomiting  . Latex Dermatitis  . Oxycodone-Acetaminophen     REACTION: hives, nausea and vomiting  . Pollen Extract   . Succinylcholine     REACTION: heart stopped    Patient Measurements: Height: 5\' 6"  (167.6 cm) Weight: (!) 305 lb (138.347 kg) IBW/kg (Calculated) : 59.3   Vital Signs: Temp: 98.1 F (36.7 C) (03/26 0351) Temp Source: Oral (03/26 0351) BP: 98/51 mmHg (03/26 0351) Pulse Rate: 64 (03/26 1306) Intake/Output from previous day:   Intake/Output from this shift: Total I/O In: 240 [P.O.:240] Out: -   Labs:  Recent Labs  11/28/15 0142  WBC 5.0  HGB 11.7*  PLT 113*  CREATININE 1.38*   Estimated Creatinine Clearance: 57.5 mL/min (by C-G formula based on Cr of 1.38). No results for input(s): VANCOTROUGH, VANCOPEAK, VANCORANDOM, GENTTROUGH, GENTPEAK, GENTRANDOM, TOBRATROUGH, TOBRAPEAK, TOBRARND, AMIKACINPEAK, AMIKACINTROU, AMIKACIN in the last 72 hours.   Microbiology: Recent Results (from the past 720 hour(s))  Urine culture     Status: None (Preliminary result)   Collection Time: 11/28/15  4:45 AM  Result Value Ref Range Status   Specimen Description URINE, RANDOM  Final   Special Requests NONE  Final   Culture >=100,000 COLONIES/mL GRAM NEGATIVE RODS  Final   Report Status PENDING  Incomplete    Medical History: Past Medical History  Diagnosis Date  . Back pain   . Arthritis   . Asthma   . Diabetes mellitus without complication ([place])   . Hypertension   . Fibromyalgia   . Thyroid disease   . Cancer (HCC)     Breast  . Splenic laceration   . Sleep apnea   . Pneumonia   . Bronchitis   . History of recurrent UTIs   . History of urinary urgency   . 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 See allergy list.  . PONV  (postoperative nausea and vomiting)   . Difficult intubation     Pt reports that she has been told "a couple times" that she was difficult to intubate, glidescope used in 2006    Assessment: 65 y.o obese female with UTI. Urine culture growing GNRs, ID & susceptibilities pending. WBC wnl, afebrile.  Ceftriaxone does not require adjustment for renal function changes/impairment.   Plan:  Ceftriaxone 2 gm IV q24h  Pharmacy signed off.   Nicole Cella, RPh Clinical Pharmacist Pager: [phone] 11/29/2015,2:24 PM

## 2015-11-29 NOTE — Progress Notes (Signed)
[facility] PROGRESS NOTE  Gina Bender [id number] DOB: 04-14-1951 DOA: 11/28/2015 PCP: Nyoka Cowden, MD  Assessment/Plan: Principal Problem:   Asthma exacerbation -We'll plan on changing Solu-Medrol to 60 mg IV every 8 hours -LAD doxycycline -Continue current bronchodilator regimen  Active Problems:   Benign essential HTN - Stable continue current antihypertensive regimen. Of note will discontinue ARB secondary to elevated serum creatinine    Extreme obesity (HCC)   Diabetes mellitus without complication (HCC) -We'll place on diabetic diet - Continue current insulin regimen  UTI - Not on antibiotic therapy. As such will start Rocephin. Awaiting final urine culture results  Code Status: Full Family Communication: Discussed directly with patient and husband at bedside Disposition Plan: Pending improvement in condition   Consultants:  None  Procedures:  None  Antibiotics:  Start doxycycline and Rocephin  HPI/Subjective: Pt has no new complaints. States improvement is minimal today.  Objective: Filed Vitals:   11/29/15 1044 11/29/15 1306  BP:    Pulse: 59 64  Temp:    Resp: 16 18    Intake/Output Summary (Last 24 hours) at 11/29/15 1409 Last data filed at 11/29/15 0800  Gross per 24 hour  Intake    240 ml  Output      0 ml  Net    240 ml   Filed Weights   11/28/15 0053  Weight: 138.347 kg (305 lb)    Exam:   General:  Pt in nad, alert and awake  Cardiovascular: no cyanosis  Respiratory: mild exp wheezes, equal chest rise  Abdomen: soft, ND, obese  Musculoskeletal: No cyanosis or clubbing  Data Reviewed: Basic Metabolic Panel:  Recent Labs Lab 11/24/15 1140 11/28/15 0142  NA 139 139  K 3.9 4.0  CL 105 103  CO2 24 23  GLUCOSE 88 92  BUN 15 16  CREATININE 0.88 1.38*  CALCIUM 8.8 8.4*   Liver Function Tests:  Recent Labs Lab 11/24/15 1140  AST 173*  ALT 52*  ALKPHOS 149*  BILITOT 1.7*  PROT 6.4   ALBUMIN 2.4*   No results for input(s): LIPASE, AMYLASE in the last 168 hours. No results for input(s): AMMONIA in the last 168 hours. CBC:  Recent Labs Lab 11/28/15 0142  WBC 5.0  NEUTROABS 3.0  HGB 11.7*  HCT 34.2*  MCV 88.4  PLT 113*   Cardiac Enzymes:  Recent Labs Lab 11/28/15 0205  CKTOTAL 379*   BNP (last 3 results)  Recent Labs  11/28/15 0204  BNP 256.3*    ProBNP (last 3 results) No results for input(s): PROBNP in the last 8760 hours.  CBG:  Recent Labs Lab 11/28/15 0517 11/28/15 1751 11/28/15 2207 11/29/15 0636 11/29/15 1150  GLUCAP 89 158* 163* 144* 167*    No results found for this or any previous visit (from the past 240 hour(s)).   Studies: Dg Chest 2 View  11/28/2015  CLINICAL DATA:  Shortness of breath tonight. Hypertension, diabetes, nonsmoker. History of COPD. EXAM: CHEST  2 VIEW COMPARISON:  None. FINDINGS: Shallow inspiration. Heart size and pulmonary vascularity are normal for technique. No focal airspace disease or consolidation in the lungs. No blunting of the costophrenic angles. No pneumothorax. Surgical clips in the chest wall and axilla on the left. Tortuous aorta. IMPRESSION: No active cardiopulmonary disease. Electronically Signed   By: Lucienne Capers M.D.   On: 11/28/2015 01:21   Dg Hip Unilat With Pelvis 2-3 Views Right  11/28/2015  CLINICAL DATA:  65 year old female with fall and right  hip pain. EXAM: DG HIP (WITH OR WITHOUT PELVIS) 2-3V RIGHT COMPARISON:  None. FINDINGS: There is no acute fracture or dislocation. The bones are osteopenic which limits evaluation for fracture. 50 the soft tissues are grossly unremarkable. IMPRESSION: No acute fracture or dislocation. Electronically Signed   By: Anner Crete M.D.   On: 11/28/2015 03:00    Scheduled Meds: . atenolol  50 mg Oral Daily  . cloNIDine  0.1 mg Oral BID  . doxycycline (VIBRAMYCIN) IV  100 mg Intravenous Q12H  . enoxaparin (LOVENOX) injection  70 mg Subcutaneous  Q24H  . furosemide  40 mg Oral Daily  . gabapentin  300 mg Oral BID  . insulin aspart  0-15 Units Subcutaneous TID WC  . insulin glargine  15 Units Subcutaneous Daily  . ipratropium-albuterol  3 mL Nebulization Q4H  . levothyroxine  175 mcg Oral QAC breakfast  . magic mouthwash  5 mL Oral QID  . methylPREDNISolone (SOLU-MEDROL) injection  60 mg Intravenous Q12H  . oxybutynin  10 mg Oral BID  . PARoxetine  40 mg Oral Daily   Continuous Infusions: . albuterol 10 mg/hr (11/28/15 0304)    Time spent: > 35 minutes   Velvet Bathe  [facility] Pager [phone]. If 7PM-7AM, please contact night-coverage at [email or web address], password [facility] 11/29/2015, 2:09 PM

## 2015-11-29 NOTE — Progress Notes (Signed)
Pt has a productive congested cough at this time.

## 2015-11-30 LAB — BASIC METABOLIC PANEL
Anion gap: 10 (ref 5–15)
BUN: 30 mg/dL — ABNORMAL HIGH (ref 6–20)
CALCIUM: 8.2 mg/dL — AB (ref 8.9–10.3)
CO2: 23 mmol/L (ref 22–32)
CREATININE: 1.43 mg/dL — AB (ref 0.44–1.00)
Chloride: 105 mmol/L (ref 101–111)
GFR, EST AFRICAN AMERICAN: 43 mL/min — AB (ref >60)
GFR, EST NON AFRICAN AMERICAN: 37 mL/min — AB (ref >60)
GLUCOSE: 195 mg/dL — AB (ref 65–99)
Potassium: 5.2 mmol/L — ABNORMAL HIGH (ref 3.5–5.1)
Sodium: 138 mmol/L (ref 135–145)

## 2015-11-30 LAB — GLUCOSE, CAPILLARY
GLUCOSE-CAPILLARY: 153 mg/dL — AB (ref 65–99)
Glucose-Capillary: 198 mg/dL — ABNORMAL HIGH (ref 65–99)
Glucose-Capillary: 136 mg/dL — ABNORMAL HIGH (ref 65–99)
Glucose-Capillary: 129 mg/dL — ABNORMAL HIGH (ref 65–99)

## 2015-11-30 LAB — URINE CULTURE

## 2015-11-30 MED ORDER — ATENOLOL 50 MG PO TABS
25.0000 mg | ORAL_TABLET | Freq: Every day | ORAL | Status: DC
  Administered 2015-12-01 – 2015-12-04 (×4): 25 mg via ORAL
  Filled 2015-11-30 (×4): qty 1

## 2015-11-30 MED ORDER — METRONIDAZOLE 500 MG PO TABS
500.0000 mg | ORAL_TABLET | Freq: Three times a day (TID) | ORAL | Status: DC
  Administered 2015-11-30 – 2015-12-02 (×6): 500 mg via ORAL
  Filled 2015-11-30 (×6): qty 1

## 2015-11-30 MED ORDER — RISAQUAD PO CAPS
1.0000 | ORAL_CAPSULE | Freq: Every day | ORAL | Status: DC
  Administered 2015-11-30 – 2015-12-15 (×16): 1 via ORAL
  Filled 2015-11-30 (×16): qty 1

## 2015-11-30 MED ORDER — METHYLPREDNISOLONE SODIUM SUCC 125 MG IJ SOLR
40.0000 mg | Freq: Two times a day (BID) | INTRAMUSCULAR | Status: DC
  Administered 2015-11-30 – 2015-12-04 (×8): 40 mg via INTRAVENOUS
  Filled 2015-11-30 (×8): qty 2

## 2015-11-30 MED ORDER — WHITE PETROLATUM GEL
Status: AC
  Administered 2015-11-30: 1
  Filled 2015-11-30: qty 1

## 2015-11-30 MED ORDER — IBUPROFEN 200 MG PO TABS
800.0000 mg | ORAL_TABLET | Freq: Once | ORAL | Status: AC
  Administered 2015-11-30: 800 mg via ORAL
  Filled 2015-11-30: qty 4

## 2015-11-30 MED ORDER — IPRATROPIUM-ALBUTEROL 0.5-2.5 (3) MG/3ML IN SOLN
3.0000 mL | Freq: Four times a day (QID) | RESPIRATORY_TRACT | Status: DC
  Administered 2015-11-30 – 2015-12-02 (×9): 3 mL via RESPIRATORY_TRACT
  Filled 2015-11-30 (×9): qty 3

## 2015-11-30 NOTE — Progress Notes (Signed)
Physical Therapy Treatment Patient Details Name: Gina Bender MRN: [id number] DOB: 02-Sep-1951 Today's Date: 11/30/2015    History of Present Illness 65 y.o. female admitted to [facility] on 11/28/15 for SOB.  Dx with asthma exacerbation.  Pt wtih significant PMHx of back pain, asthma, DM, HTN, fibromyalgia, CA, recurrent UTIs and urinary urgency, morbid obesity, bil knee arthroscopy, and back surgery.  Of note, pt reports recent fall to right side and buttocks with inability to move well at home (only able to go to the bathroom, was not eating) since the fall. Per MD note there is no fracture.      PT Comments    Patient is making gradual progress toward mobility goals. Needs max encouragement to increase OOB mobility and encouraged to sit up in chair throughout the day and continue using BSC and ambulating to restroom with nursing staff instead of using bed pan. Continue to progress as tolerated with anticipated d/c to SNF for further skilled PT services.    Follow Up Recommendations  SNF     Equipment Recommendations  None recommended by PT    Recommendations for Other Services       Precautions / Restrictions Precautions Precautions: Fall Precaution Comments: hitory of recent fall Restrictions Weight Bearing Restrictions: No    Mobility  Bed Mobility Overal bed mobility: Needs Assistance Bed Mobility: Supine to Sit     Supine to sit: HOB elevated;Min guard (assist at trunk for balance)     General bed mobility comments: cues for technique and hand placement; heavy reliance on bed rails, increased time needed, and HOB elevated  Transfers Overall transfer level: Needs assistance Equipment used: Rolling walker (2 wheeled) Transfers: Sit to/from Stand Sit to Stand: Min assist;From elevated surface;+2 safety/equipment         General transfer comment: assist for safe descent to chair with max cues for hand placement and technique  Ambulation/Gait Ambulation/Gait  assistance: Min guard;+2 safety/equipment Ambulation Distance (Feet): 8 Feet Assistive device: Rolling walker (2 wheeled) Gait Pattern/deviations: Step-to pattern;Decreased step length - left;Decreased step length - right;Trunk flexed     General Gait Details: cues for posture and sequencing of gait with use of AD; pt needs max encouragement to ambulate further   Stairs            Wheelchair Mobility    Modified Rankin (Stroke Patients Only)       Balance Overall balance assessment: Needs assistance   Sitting balance-Leahy Scale: Fair     Standing balance support: Bilateral upper extremity supported Standing balance-Leahy Scale: Poor Standing balance comment: reliant on RW for support                    Cognition Arousal/Alertness: Awake/alert Behavior During Therapy: WFL for tasks assessed/performed Overall Cognitive Status: Within Functional Limits for tasks assessed                      Exercises      General Comments General comments (skin integrity, edema, etc.): 4L O2 via nasal canula throughout session; SpO2 remained above 94%      Pertinent Vitals/Pain Pain Assessment: Faces Faces Pain Scale: Hurts a little bit Pain Location: bilat knees Pain Descriptors / Indicators: Grimacing;Aching Pain Intervention(s): Limited activity within patient's tolerance;Monitored during session;Premedicated before session;Repositioned    Home Living                      Prior Function  PT Goals (current goals can now be found in the care plan section) Acute Rehab PT Goals Patient Stated Goal: no more falls Progress towards PT goals: Progressing toward goals    Frequency  Min 3X/week    PT Plan Current plan remains appropriate    Co-evaluation             End of Session Equipment Utilized During Treatment: Oxygen Activity Tolerance: Patient tolerated treatment well Patient left: in chair;with call bell/phone within  reach;with chair alarm set;with family/visitor present     Time: [id number] PT Time Calculation (min) (ACUTE ONLY): 32 min  Charges:  $Gait Training: 8-22 mins $Therapeutic Activity: 8-22 mins                    G Codes:      Salina April, PTA Pager: [phone]   11/30/2015, 10:22 AM

## 2015-11-30 NOTE — Clinical Documentation Improvement (Signed)
Hospitalist  (Please document query responses in the current medical record, not on the CDI BPA form.  Thank you.)  Possible Clinical Conditions:  - Acute Kidney Injury, including any associated conditions and/or causes, and any historical or baseline data to support the diagnosis  - Other condition  - Unable to clinically determine  Clinical Information/Indicators: Creatinine increased 0.50 mg/dL on admission from previous     (white female) Component     Latest Ref Rng 11/24/2015 11/28/2015 11/29/2015  BUN     6 - 20 mg/dL _0 (H)  Creatinine     0.44 - 1.00 mg/dL 0.88 1.38 (H) 1.43 (H)  EGFR (Non-African Amer.)     >60 mL/min  39 (L) 37 (L)    Please exercise your independent, professional judgment when responding. A specific answer is not anticipated or expected.   Thank You, Erling Conte RN BSN CCDS [phone] Health Information Management [place]

## 2015-11-30 NOTE — NC FL2 (Addendum)
[place] MEDICAID FL2 LEVEL OF CARE SCREENING TOOL     IDENTIFICATION  Patient Name: Gina Bender Birthdate: Jan 09, 1951 Sex: female Admission Date (Current Location): 11/28/2015  [facility] and [place] Number:   Freddrick March)   Facility and Address:  [facility] [place] [address], [place], [place] [address]      Provider Number: [id number]  Attending Physician Name and Address:  Lavina Hamman  Relative Name and Phone Number:     Gu[place], Jentzsch [facility] [id number] [phone]       Current Level of Care: Hospital Recommended Level of Care: [place] Prior Approval Number:    Date Approved/Denied:   PASRR Number:   [id number]  Discharge Plan: SNF    Current Diagnoses: Patient Active Problem List   Diagnosis Date Noted  . Asthma exacerbation 11/28/2015  . UTI (lower urinary tract infection) 11/28/2015  . Diabetes mellitus without complication ([place]) 0000000  . Extreme obesity ([place]) 11/27/2014  . Diabetes ([place]) 06/03/2013  . Acquired hypothyroidism 09/18/2011  . Apnea, sleep 09/18/2011  . Benign essential HTN 09/18/2011  . LBP (low back pain) 09/18/2011    Orientation RESPIRATION BLADDER Height & Weight     Self, Time, Situation, Place  O2 Incontinent Weight: (!) 305 lb (138.347 kg) Height:  5\' 6"  (167.6 cm)  BEHAVIORAL SYMPTOMS/MOOD NEUROLOGICAL BOWEL NUTRITION STATUS   (n/a)  (n/a) Continent Diet (Carb Modified)  AMBULATORY STATUS COMMUNICATION OF NEEDS Skin   Limited Assist Verbally  ([id number]) PU Stage 1 Dressing:  (no PU stage 1)                     Personal Care Assistance Level of Assistance  Bathing, Feeding, Dressing Bathing Assistance: Limited assistance Feeding assistance: Independent Dressing Assistance: Limited assistance     Functional Limitations Info  Sight, Hearing, Speech Sight Info: Adequate Hearing Info: Adequate Speech Info: Adequate    SPECIAL CARE FACTORS FREQUENCY  PT (By  licensed PT), OT (By licensed OT)     PT Frequency: 5x/week OT Frequency: 5x/week            Contractures Contractures Info: Not present    Additional Factors Info  Code Status, Allergies, Insulin Sliding Scale, Isolation Precautions Code Status Info: FULL CODE Allergies Info: Hydrocodone-acetaminophen, Latex, Oxycodone-acetaminophen, Pollen Extract, Succinylcholine   Insulin Sliding Scale Info: 0-15 units 3x/day Isolation Precautions Info: Entric Precautions     Current Medications (11/30/2015):  This is the current hospital active medication list Current Facility-Administered Medications  Medication Dose Route Frequency Provider Last Rate Last Dose  . acidophilus (RISAQUAD) capsule 1 capsule  1 capsule Oral Daily Kinnie Feil, MD      . albuterol (PROVENTIL) (2.5 MG/3ML) 0.083% nebulizer solution 3 mL  3 mL Inhalation Q6H PRN Etta Quill, DO      . albuterol (PROVENTIL,VENTOLIN) solution continuous neb  10 mg/hr Nebulization Continuous Kristen N Ward, DO 2 mL/hr at 11/28/15 0304 10 mg/hr at 11/28/15 0304  . [START ON 12/01/2015] atenolol (TENORMIN) tablet 25 mg  25 mg Oral Daily Kinnie Feil, MD      . cefTRIAXone (ROCEPHIN) 2 g in dextrose 5 % 50 mL IVPB  2 g Intravenous Q24H Wendee Beavers, RPH   2 g at 11/29/15 1530  . enoxaparin (LOVENOX) injection 70 mg  70 mg Subcutaneous Q24H Etta Quill, DO   70 mg at 11/29/15 1545  . gabapentin (NEURONTIN) capsule 300 mg  300 mg Oral BID Kevan Ny  Nestor Lewandowsky, DO   300 mg at 11/30/15 1156  . insulin aspart (novoLOG) injection 0-15 Units  0-15 Units Subcutaneous TID WC Etta Quill, DO   2 Units at 11/30/15 1255  . insulin glargine (LANTUS) injection 15 Units  15 Units Subcutaneous Daily Etta Quill, DO   15 Units at 11/30/15 1255  . ipratropium-albuterol (DUONEB) 0.5-2.5 (3) MG/3ML nebulizer solution 3 mL  3 mL Nebulization Q6H Velvet Bathe, MD   3 mL at 11/30/15 1326  . levothyroxine (SYNTHROID, LEVOTHROID) tablet 175 mcg   175 mcg Oral QAC breakfast Etta Quill, DO   175 mcg at 11/30/15 1155  . magic mouthwash  5 mL Oral QID Velvet Bathe, MD   5 mL at 11/29/15 2208  . methylPREDNISolone sodium succinate (SOLU-MEDROL) 125 mg/2 mL injection 40 mg  40 mg Intravenous Q12H Kinnie Feil, MD      . metroNIDAZOLE (FLAGYL) tablet 500 mg  500 mg Oral 3 times per day Kinnie Feil, MD      . oxybutynin (DITROPAN) tablet 10 mg  10 mg Oral BID Etta Quill, DO   10 mg at 11/30/15 1157  . PARoxetine (PAXIL) tablet 40 mg  40 mg Oral Daily Etta Quill, DO   40 mg at 11/30/15 1156  . polyethylene glycol (MIRALAX / GLYCOLAX) packet 17 g  17 g Oral Daily PRN Etta Quill, DO      . senna (SENOKOT) tablet 8.6 mg  1 tablet Oral Daily PRN Etta Quill, DO      . tiZANidine (ZANAFLEX) tablet 2 mg  2 mg Oral Q8H PRN Etta Quill, DO   2 mg at 11/30/15 [id number]     Discharge Medications: Please see discharge summary for a list of discharge medications.  Relevant Imaging Results:  Relevant Lab Results:   Additional Information SS# [id number]   [place] Intern, [id number]

## 2015-11-30 NOTE — Progress Notes (Signed)
Pt complaining of pain in left neck. Lymph node palpated. Approximately 3cm diameter. Hospitalitis notified for pain medication.

## 2015-11-30 NOTE — Progress Notes (Signed)
[facility] PROGRESS NOTE  Gina Bender [id number] DOB: [birth year] DOA: 11/28/2015 PCP: Nyoka Cowden, MD  Assessment/Plan: 65 y/o female with PMH of HTN, Fibromyalgia, DM, Asthma, Obesity presented with SOB, Cough. Admitted with Asthma exacerbation, also fount to have UTI  Asthma exacerbation. Pt is improving on solu-Medrol, bronchodilator regimen. Taper steroids.   Benign essential HTN. BP is borderline low. Hold clonidine, decrease atenolol. Monitor titrate as needed.  Of note will discontinue ARB secondary to elevated serum creatinine  Diabetes mellitus without complication. ha1c-6.1. continue current insulin regimen. Hold metformin   UTI. started Rocephin 3/26>>>.stable. We will add flagyl due to recent h/o c diff top prevent reinfection   AKI. Likely dehydration diarrhea prior to admission. Hold lasix. Recheck renal function AM   Code Status: full Family Communication: d/w patient (indicate person spoken with, relationship, and if by phone, the number) Disposition Plan: SNF   Consultants:  none  Procedures:  none  Antibiotics:  ceftriaqxone 3/26>>> (indicate start date, and stop date if known)  HPI/Subjective: Alert, no distress   Objective: Filed Vitals:   11/30/15 1156 11/30/15 1157  BP: 119/58 119/58  Pulse:    Temp:    Resp:      Intake/Output Summary (Last 24 hours) at 11/30/15 1335 Last data filed at 11/30/15 0135  Gross per 24 hour  Intake    400 ml  Output      0 ml  Net    400 ml   Filed Weights   11/28/15 0053  Weight: 138.347 kg (305 lb)    Exam:   General:  Comfortable   Cardiovascular: s1,s2 rrr  Respiratory: few wheezing LL  Abdomen: soft, obese, nt   Musculoskeletal: chronic mild edema    Data Reviewed: Basic Metabolic Panel:  Recent Labs Lab 11/24/15 1140 11/28/15 0142 11/29/15 2350  NA 139 139 138  K 3.9 4.0 5.2*  CL 105 103 105  CO2 24 23 23   GLUCOSE 88 92 195*  BUN 15 16 30*   CREATININE 0.88 1.38* 1.43*  CALCIUM 8.8 8.4* 8.2*   Liver Function Tests:  Recent Labs Lab 11/24/15 1140  AST 173*  ALT 52*  ALKPHOS 149*  BILITOT 1.7*  PROT 6.4  ALBUMIN 2.4*   No results for input(s): LIPASE, AMYLASE in the last 168 hours. No results for input(s): AMMONIA in the last 168 hours. CBC:  Recent Labs Lab 11/28/15 0142  WBC 5.0  NEUTROABS 3.0  HGB 11.7*  HCT 34.2*  MCV 88.4  PLT 113*   Cardiac Enzymes:  Recent Labs Lab 11/28/15 0205  CKTOTAL 379*   BNP (last 3 results)  Recent Labs  11/28/15 0204  BNP 256.3*    ProBNP (last 3 results) No results for input(s): PROBNP in the last 8760 hours.  CBG:  Recent Labs Lab 11/29/15 1150 11/29/15 1602 11/29/15 2138 11/30/15 0626 11/30/15 1149  GLUCAP 167* 168* 149* 198* 136*    Recent Results (from the past 240 hour(s))  Urine culture     Status: None   Collection Time: 11/28/15  4:45 AM  Result Value Ref Range Status   Specimen Description URINE, RANDOM  Final   Special Requests NONE  Final   Culture >=100,000 COLONIES/mL CITROBACTER FREUNDII  Final   Report Status 11/30/2015 FINAL  Final   Organism ID, Bacteria CITROBACTER FREUNDII  Final      Susceptibility   Citrobacter freundii - MIC*    CEFAZOLIN >=64 RESISTANT Resistant     CEFTRIAXONE <=1 SENSITIVE Sensitive  CIPROFLOXACIN <=0.25 SENSITIVE Sensitive     GENTAMICIN <=1 SENSITIVE Sensitive     IMIPENEM 0.5 SENSITIVE Sensitive     NITROFURANTOIN 128 RESISTANT Resistant     TRIMETH/SULFA <=20 SENSITIVE Sensitive     PIP/TAZO <=4 SENSITIVE Sensitive     * >=100,000 COLONIES/mL CITROBACTER FREUNDII     Studies: Dg Chest 2 View  11/29/2015  CLINICAL DATA:  Shortness of breath. EXAM: CHEST  2 VIEW COMPARISON:  None. FINDINGS: Cardiac size upper limits normal for degree of inspiration. Surgical clips in the chest wall and axilla on the LEFT. No active infiltrates or failure. Similar appearance to yesterday's radiograph.  IMPRESSION: Poor inspiration.  No definite active process. Electronically Signed   By: Staci Righter M.D.   On: 11/29/2015 15:08    Scheduled Meds: . atenolol  50 mg Oral Daily  . cefTRIAXone (ROCEPHIN)  IV  2 g Intravenous Q24H  . cloNIDine  0.1 mg Oral BID  . doxycycline (VIBRAMYCIN) IV  100 mg Intravenous Q12H  . enoxaparin (LOVENOX) injection  70 mg Subcutaneous Q24H  . furosemide  40 mg Oral Daily  . gabapentin  300 mg Oral BID  . insulin aspart  0-15 Units Subcutaneous TID WC  . insulin glargine  15 Units Subcutaneous Daily  . ipratropium-albuterol  3 mL Nebulization Q6H  . levothyroxine  175 mcg Oral QAC breakfast  . magic mouthwash  5 mL Oral QID  . methylPREDNISolone (SOLU-MEDROL) injection  60 mg Intravenous 3 times per day  . oxybutynin  10 mg Oral BID  . PARoxetine  40 mg Oral Daily   Continuous Infusions: . albuterol 10 mg/hr (11/28/15 0304)    Principal Problem:   Asthma exacerbation Active Problems:   Benign essential HTN   Extreme obesity ([place])   Diabetes mellitus without complication ([place])   UTI (lower urinary tract infection)    Time spent: >35 minutes     Kinnie Feil  [facility] Pager [phone]. If 7PM-7AM, please contact night-coverage at [email or web address], password [facility] 11/30/2015, 1:35 PM  LOS: 1 day

## 2015-12-01 ENCOUNTER — Encounter (HOSPITAL_COMMUNITY): Payer: Self-pay | Admitting: General Practice

## 2015-12-01 LAB — BASIC METABOLIC PANEL
Anion gap: 8 (ref 5–15)
BUN: 27 mg/dL — AB (ref 6–20)
CALCIUM: 8 mg/dL — AB (ref 8.9–10.3)
CO2: 29 mmol/L (ref 22–32)
CREATININE: 1.26 mg/dL — AB (ref 0.44–1.00)
Chloride: 100 mmol/L — ABNORMAL LOW (ref 101–111)
GFR calc Af Amer: 50 mL/min — ABNORMAL LOW (ref >60)
GFR, EST NON AFRICAN AMERICAN: 43 mL/min — AB (ref >60)
GLUCOSE: 150 mg/dL — AB (ref 65–99)
Potassium: 3.5 mmol/L (ref 3.5–5.1)
Sodium: 137 mmol/L (ref 135–145)

## 2015-12-01 LAB — HEPATIC FUNCTION PANEL
ALT: 43 U/L (ref 14–54)
AST: 97 U/L — ABNORMAL HIGH (ref 15–41)
Albumin: 1.6 g/dL — ABNORMAL LOW (ref 3.5–5.0)
Alkaline Phosphatase: 107 U/L (ref 38–126)
BILIRUBIN DIRECT: 0.5 mg/dL (ref 0.1–0.5)
BILIRUBIN TOTAL: 0.9 mg/dL (ref 0.3–1.2)
Indirect Bilirubin: 0.4 mg/dL (ref 0.3–0.9)
Total Protein: 5.3 g/dL — ABNORMAL LOW (ref 6.5–8.1)

## 2015-12-01 LAB — GLUCOSE, CAPILLARY
GLUCOSE-CAPILLARY: 163 mg/dL — AB (ref 65–99)
Glucose-Capillary: 131 mg/dL — ABNORMAL HIGH (ref 65–99)
Glucose-Capillary: 177 mg/dL — ABNORMAL HIGH (ref 65–99)
Glucose-Capillary: 131 mg/dL — ABNORMAL HIGH (ref 65–99)

## 2015-12-01 NOTE — Care Management Note (Signed)
Case Management Note  Patient Details  Name: Gina Bender MRN: [id number] Date of Birth: August 01, 1951  Subjective/Objective:            Admitted with asthma exacerbation        Action/Plan: PT/OT recommending SNF. Referral made to CSW. CSW working on SNF placement for short term rehab. Will continue to follow.   Expected Discharge Date:                  Expected Discharge Plan:  Skilled Nursing Facility  In-House Referral:  Clinical Social Work  Discharge planning Services  CM Consult  Post Acute Care Choice:    Choice offered to:     DME Arranged:    DME Agency:     HH Arranged:    [place] Agency:     Status of Service:  In process, will continue to follow  Medicare Important Message Given:    Date Medicare IM Given:    Medicare IM give by:    Date Additional Medicare IM Given:    Additional Medicare Important Message give by:     If discussed at [place] of Stay Meetings, dates discussed:    Additional Comments:  Nila Nephew, RN 12/01/2015, 4:26 PM

## 2015-12-01 NOTE — Progress Notes (Signed)
Patient seen at [facility] office on 11/16/15. No testing performed (see note from Dr. Sarajane Jews). May discontinue Enteric precautions.  Luther Hearing RN BSN Infection Prevention

## 2015-12-01 NOTE — Progress Notes (Signed)
[facility] PROGRESS NOTE  Gina Bender [id number] DOB: [birth year] DOA: 11/28/2015 PCP: Nyoka Cowden, MD  Assessment/Plan: 65 y/o female with PMH of HTN, Fibromyalgia, DM, Asthma, Obesity presented with SOB, Cough. Admitted with Asthma exacerbation, also fount to have UTI. Patient also has chronic edema of unclear etiology. She reports taking lasix at home prn  Asthma exacerbation. Pt is improving on solu-Medrol, bronchodilator regimen. Tapering steroids.   Benign essential HTN. BP is borderline low. Holding clonidine, decreased atenolol. Monitor titrate as needed.  Of note will discontinue ARB secondary to elevated serum creatinine  Diabetes mellitus without complication. ha1c-6.1. continue current insulin regimen. Hold metformin   UTI. started Rocephin 3/26>>>. D/c atxTomorrow. We will add flagyl for 1 more day due to recent h/o c diff to prevent reinfection. Patient reports recent diarrhea, no further episodes in the hospital     AKI. Likely dehydration diarrhea prior to admission. Holding lasix.   Edema of unclear etiology. She reports taking lasix at home prn. Will check echo, check LFTs, albumin. Negative urine protein. No s/s of acute CHF. Holding lasix, resume as needed     Code Status: full Family Communication: d/w patient (indicate person spoken with, relationship, and if by phone, the number). Recently updated her husband  Disposition Plan: SNF, no beds yet    Consultants:  none  Procedures:  none  Antibiotics:  ceftriaqxone 3/26>>> (indicate start date, and stop date if known)  HPI/Subjective: Alert, no distress   Objective: Filed Vitals:   11/30/15 2129 12/01/15 0500  BP: 126/55 119/72  Pulse: 65 70  Temp: 97.6 F (36.4 C) 98.2 F (36.8 C)  Resp: 17 16    Intake/Output Summary (Last 24 hours) at 12/01/15 1110 Last data filed at 12/01/15 0900  Gross per 24 hour  Intake    240 ml  Output    200 ml  Net     40 ml   Filed  Weights   11/28/15 0053  Weight: 138.347 kg (305 lb)    Exam:   General:  Comfortable   Cardiovascular: s1,s2 rrr  Respiratory: few wheezing LL  Abdomen: soft, obese, nt   Musculoskeletal: chronic mild edema    Data Reviewed: Basic Metabolic Panel:  Recent Labs Lab 11/24/15 1140 11/28/15 0142 11/29/15 2350 12/01/15 0629  NA 139 139 138 137  K 3.9 4.0 5.2* 3.5  CL 105 103 105 100*  CO2 24 23 23 29   GLUCOSE 88 92 195* 150*  BUN 15 16 30* 27*  CREATININE 0.88 1.38* 1.43* 1.26*  CALCIUM 8.8 8.4* 8.2* 8.0*   Liver Function Tests:  Recent Labs Lab 11/24/15 1140  AST 173*  ALT 52*  ALKPHOS 149*  BILITOT 1.7*  PROT 6.4  ALBUMIN 2.4*   No results for input(s): LIPASE, AMYLASE in the last 168 hours. No results for input(s): AMMONIA in the last 168 hours. CBC:  Recent Labs Lab 11/28/15 0142  WBC 5.0  NEUTROABS 3.0  HGB 11.7*  HCT 34.2*  MCV 88.4  PLT 113*   Cardiac Enzymes:  Recent Labs Lab 11/28/15 0205  CKTOTAL 379*   BNP (last 3 results)  Recent Labs  11/28/15 0204  BNP 256.3*    ProBNP (last 3 results) No results for input(s): PROBNP in the last 8760 hours.  CBG:  Recent Labs Lab 11/30/15 0626 11/30/15 1149 11/30/15 1707 11/30/15 2143 12/01/15 0707  GLUCAP 198* 136* 153* 129* 131*    Recent Results (from the past 240 hour(s))  Urine culture  Status: None   Collection Time: 11/28/15  4:45 AM  Result Value Ref Range Status   Specimen Description URINE, RANDOM  Final   Special Requests NONE  Final   Culture >=100,000 COLONIES/mL CITROBACTER FREUNDII  Final   Report Status 11/30/2015 FINAL  Final   Organism ID, Bacteria CITROBACTER FREUNDII  Final      Susceptibility   Citrobacter freundii - MIC*    CEFAZOLIN >=64 RESISTANT Resistant     CEFTRIAXONE <=1 SENSITIVE Sensitive     CIPROFLOXACIN <=0.25 SENSITIVE Sensitive     GENTAMICIN <=1 SENSITIVE Sensitive     IMIPENEM 0.5 SENSITIVE Sensitive     NITROFURANTOIN 128  RESISTANT Resistant     TRIMETH/SULFA <=20 SENSITIVE Sensitive     PIP/TAZO <=4 SENSITIVE Sensitive     * >=100,000 COLONIES/mL CITROBACTER FREUNDII     Studies: Dg Chest 2 View  11/29/2015  CLINICAL DATA:  Shortness of breath. EXAM: CHEST  2 VIEW COMPARISON:  None. FINDINGS: Cardiac size upper limits normal for degree of inspiration. Surgical clips in the chest wall and axilla on the LEFT. No active infiltrates or failure. Similar appearance to yesterday's radiograph. IMPRESSION: Poor inspiration.  No definite active process. Electronically Signed   By: Staci Righter M.D.   On: 11/29/2015 15:08    Scheduled Meds: . acidophilus  1 capsule Oral Daily  . atenolol  25 mg Oral Daily  . cefTRIAXone (ROCEPHIN)  IV  2 g Intravenous Q24H  . enoxaparin (LOVENOX) injection  70 mg Subcutaneous Q24H  . gabapentin  300 mg Oral BID  . insulin aspart  0-15 Units Subcutaneous TID WC  . insulin glargine  15 Units Subcutaneous Daily  . ipratropium-albuterol  3 mL Nebulization Q6H  . levothyroxine  175 mcg Oral QAC breakfast  . magic mouthwash  5 mL Oral QID  . methylPREDNISolone (SOLU-MEDROL) injection  40 mg Intravenous Q12H  . metroNIDAZOLE  500 mg Oral 3 times per day  . oxybutynin  10 mg Oral BID  . PARoxetine  40 mg Oral Daily   Continuous Infusions: . albuterol 10 mg/hr (11/28/15 0304)    Principal Problem:   Asthma exacerbation Active Problems:   Benign essential HTN   Extreme obesity ([place])   Diabetes mellitus without complication ([place])   UTI (lower urinary tract infection)    Time spent: >35 minutes     Kinnie Feil  [facility] Pager [phone]. If 7PM-7AM, please contact night-coverage at [email or web address], password [facility] 12/01/2015, 11:10 AM  LOS: 2 days

## 2015-12-02 ENCOUNTER — Inpatient Hospital Stay (HOSPITAL_COMMUNITY): Payer: Medicare Other

## 2015-12-02 DIAGNOSIS — R0600 Dyspnea, unspecified: Secondary | ICD-10-CM

## 2015-12-02 LAB — GLUCOSE, CAPILLARY
GLUCOSE-CAPILLARY: 141 mg/dL — AB (ref 65–99)
GLUCOSE-CAPILLARY: 155 mg/dL — AB (ref 65–99)
Glucose-Capillary: 121 mg/dL — ABNORMAL HIGH (ref 65–99)
Glucose-Capillary: 188 mg/dL — ABNORMAL HIGH (ref 65–99)

## 2015-12-02 LAB — ECHOCARDIOGRAM COMPLETE
HEIGHTINCHES: 66 in
WEIGHTICAEL: 4880 [oz_av]

## 2015-12-02 MED ORDER — ALBUTEROL SULFATE (2.5 MG/3ML) 0.083% IN NEBU
2.5000 mg | INHALATION_SOLUTION | RESPIRATORY_TRACT | Status: DC
  Administered 2015-12-02 – 2015-12-03 (×6): 2.5 mg via RESPIRATORY_TRACT
  Filled 2015-12-02 (×6): qty 3

## 2015-12-02 MED ORDER — IPRATROPIUM BROMIDE 0.02 % IN SOLN: 0.5000 mg | RESPIRATORY_TRACT | Status: DC | PRN

## 2015-12-02 MED ORDER — POLYETHYLENE GLYCOL 3350 17 G PO PACK
17.0000 g | PACK | Freq: Every day | ORAL | Status: DC
  Administered 2015-12-03 – 2015-12-04 (×2): 17 g via ORAL
  Filled 2015-12-02 (×2): qty 1

## 2015-12-02 MED ORDER — SENNA 8.6 MG PO TABS
1.0000 | ORAL_TABLET | Freq: Every day | ORAL | Status: DC
  Administered 2015-12-02 – 2015-12-03 (×2): 8.6 mg via ORAL
  Filled 2015-12-02 (×2): qty 1

## 2015-12-02 NOTE — Consult Note (Addendum)
   [facility] CM Inpatient Consult   12/02/2015  Gina Bender May 02, 1951 [id number]    Patient screened for potential [facility] Care Management services. Chart reviewed. Appears current disposition plan is likely for SNF. [facility] Care Management services not appropriate at this time. If patient's post hospital needs change please place a [facility] Care Management consult. Will discuss with inpatient RNCM. Spoke with inpatient Licensed CSW. For questions please contact:  Marthenia Rolling, [place], RN,BSN [facility] Liaison [phone]

## 2015-12-02 NOTE — Progress Notes (Signed)
Occupational Therapy Treatment Patient Details Name: Gina Bender MRN: [id number] DOB: December 13, 1950 Today's Date: 12/02/2015    History of present illness 65 y.o. female admitted to [facility] on 11/28/15 for SOB.  Dx with asthma exacerbation.  Pt wtih significant PMHx of back pain, asthma, DM, HTN, fibromyalgia, CA, recurrent UTIs and urinary urgency, morbid obesity, bil knee arthroscopy, and back surgery.  Of note, pt reports recent fall to right side and buttocks with inability to move well at home (only able to go to the bathroom, was not eating) since the fall. Per MD note there is no fracture.     OT comments  Pt progressing. Will continue to follow acutely and continue to recommend SNF.  Follow Up Recommendations  SNF    Equipment Recommendations  Other (comment) (defer to next venue)    Recommendations for Other Services      Precautions / Restrictions Precautions Precautions: Fall Precaution Comments: hitory of recent fall Restrictions Weight Bearing Restrictions: No       Mobility Bed Mobility Overal bed mobility: Needs Assistance Bed Mobility: Supine to Sit     Supine to sit: Supervision      Transfers Overall transfer level: Needs assistance Equipment used: Rolling walker (2 wheeled) Transfers: Sit to/from [facility] Sit to Stand: Min guard Stand pivot transfers: Min guard       General transfer comment: Cues given for technique and hand placement.    Balance   Used RW for stand pivot transfer-Min guard.                  ADL Overall ADL's : Needs assistance/impaired Eating/Feeding: Independent;Sitting   Grooming: Wash/dry face;Set up;Supervision/safety;Sitting (on BSC)           Upper Body Dressing : Maximal assistance;Sitting Upper Body Dressing Details (indicate cue type and reason): OT helped with managing gown     Toilet Transfer: Min guard;Stand-pivot;RW;Requires wide/bariatric;BSC   Toileting- Clothing  Manipulation and Hygiene: Sit to/from stand;Maximal assistance (see comments) Toileting - Clothing Manipulation Details (indicate cue type and reason): OT performed hygiene with washcloth and pt dried off peri area with towel and OT dried off buttocks with towel       Functional mobility during ADLs: Min guard;Rolling walker (stand pivot to/from [facility]) General ADL Comments: Educated on energy conservation.       Vision                     Perception     Praxis      Cognition  Awake/Alert Behavior During Therapy: WFL for tasks assessed/performed Overall Cognitive Status: Within Functional Limits for tasks assessed                       Extremity/Trunk Assessment               Exercises     Shoulder Instructions       General Comments      Pertinent Vitals/ Pain       Pain Assessment: 0-10 Pain Score: 8  Pain Location: legs and feet Pain Intervention(s): Monitored during session  Home Living                                          Prior Functioning/Environment              Frequency Min  2X/week     Progress Toward Goals  OT Goals(current goals can now be found in the care plan section)  Progress towards OT goals: Progressing toward goals  Acute Rehab OT Goals Patient Stated Goal: not stated OT Goal Formulation: With patient Time For Goal Achievement: 12/13/15 Potential to Achieve Goals: Good ADL Goals Pt Will Perform Grooming: with modified independence;sitting Pt Will Perform Upper Body Bathing: with modified independence;sitting Pt Will Perform Lower Body Bathing: with min assist;sit to/from stand Pt Will Transfer to Toilet: with min assist;ambulating;bedside commode Pt Will Perform Toileting - Clothing Manipulation and hygiene: sit to/from stand;with min guard assist Pt/caregiver will Perform Home Exercise Program: Increased strength;Both right and left upper extremity;With theraband;Independently;With written  HEP provided  Plan Discharge plan needs to be updated    Co-evaluation                 End of Session Equipment Utilized During Treatment: Oxygen;Rolling walker;Gait belt   Activity Tolerance Patient tolerated treatment well   Patient Left in bed;with call bell/phone within reach;Other (comment) (sitting EOB eating)   Nurse Communication Other (comment) (pt sitting EOB; unsure where bed alarm is)        Time: 1354-1415 OT Time Calculation (min): 21 min  Charges: OT General Charges $OT Visit: 1 Procedure OT Treatments $Self Care/Home Management : 8-22 mins  Benito Mccreedy OTR/L [id number] 12/02/2015, 2:40 PM

## 2015-12-02 NOTE — Clinical Social Work Note (Addendum)
CSW attempted to contact patient's husband to discuss bed offers, CSW left a message on voice mail for patient's husband Waunita Schooner to call back.  CSW to continue to follow patient's progress.  5:55pm  CSW received call back from patient's husband he would like patient to go to [facility] if possible, CSW spoke to [facility] who is reviewing patient's information to make decision if they will accept patient or not.  Patient's husband was made aware of other bed offers if patient is not able to go to [facility]. [place], MSW, [place] 12/02/2015 4:38 PM

## 2015-12-02 NOTE — Progress Notes (Signed)
[facility] Progress Note  Patient: Gina Bender [id number]   PCP: Nyoka Cowden, MD DOB: [birth year]   DOA: 11/28/2015   DOS: 12/02/2015   Date of Service: the patient was seen and examined on 12/02/2015  Subjective: Patient continues to have increasing complains of shortness of breath. Also has dry cough, No fever no chest pain and nausea and vomiting abdominal pain. He does not have any bowel movement since last 10 days. Nutrition: Total intake  Brief hospital course: Patient was admitted on 11/28/2015, with complaint of cough and shortness of breath, was found to have asthma exacerbation with acute hypoxic respiratory failure. Currently further plan is continue nebulizers and Solu-Medrol.  Assessment and Plan: 1. Asthma exacerbation Patient continues to remain on 4 L of oxygen. Bilateral expiratory wheezing is also present. With this I would continue Solu-Medrol 40 mg every 12 hours. Change DuoNeb to albuterol every 4 hours and ipratropium as needed.  2. Obesity hypoventilation syndrome, obstructive sleep apnea. The patient is willing to try C Pap again. Would order.  3. UTI. Recent C. difficile. Patient has been started on IV ceftriaxone on admission  Given her history of recurrent C. difficile I would cut short the treatment duration to 3 days since the patient does not have any significant urinary or abdominal symptoms. Also discontinue Flagyl since it was started only to prevent C. difficile infection.  4. Constipation. C. difficile. The patient does not have any bowel movement in last 10 days. At present patient will be started on oral MiraLAX and Senokot. Monitor for any worsening symptoms.  5. Acute kidney injury. Hyperkalemia. Currently resolved. Likely from dehydration.  6. Chronic diastolic dysfunction. Echogram shows grade 1 diastolic dysfunction with normal ejection fraction. No valvular dysfunction. Patient on Lasix at home which is  currently on hold due to dehydration.  7. Type 2 diabetes mellitus. Hemoglobin A1c well controlled. Continue home regimen. Continue sliding scale in the setting of steroid-induced hyperglycemia  8. Hypothyroidism. Continue Synthroid.  9. Chronic pain syndrome. A diabetic neuropathy. Mood disorder Continue gabapentin continue Paxil continue Zanaflex.  Activity: physical therapy SNF Bowel regimen: last BM 10 days ago DVT Prophylaxis: subcutaneous Heparin Nutrition: Carb modified heart healthy diet Advance goals of care discussion: Full code  HPI: As per the H and P dictated on admission, "Gina Bender is a 65 y.o. female with pmh of asthma who is brought in by ambulance. She presents to the ED with c/o SOB onset 2 days PTA that recently worsened today. Associated cough and wheezing. No fever, chills. Inhalor provides no relief. No h/o COPD, does have h/o OSA with a CPAP machine that she dosent really use.  Patient also has h/o C.Diff 3 weeks prior, had been having diarrhea since then but hasnt had a BM in several days now.  Patient reports R hip pain since a fall a few days back. This wasn't fractured, but she is essentially bed bound at this point and struggling to take care of herself due to extreme obesity recently." Procedures: Echocardiogram Consultants: None Antibiotics: Anti-infectives    Start     Dose/Rate Route Frequency Ordered Stop   11/30/15 1500  metroNIDAZOLE (FLAGYL) tablet 500 mg     500 mg Oral 3 times per day 11/30/15 1346     11/29/15 1530  cefTRIAXone (ROCEPHIN) 2 g in dextrose 5 % 50 mL IVPB     2 g 100 mL/hr over 30 Minutes Intravenous Every 24 hours 11/29/15 1424     11/29/15  1315  doxycycline (VIBRAMYCIN) 100 mg in dextrose 5 % 250 mL IVPB  Status:  Discontinued     100 mg 125 mL/hr over 120 Minutes Intravenous Every 12 hours 11/29/15 1301 11/30/15 1346     Family Communication: family was present at bedside, at the time of interview.    Opportunity was given to ask question and all questions were answered satisfactorily.   Disposition:  Expected discharge date:12/04/2015 Barriers to safe discharge: Improvement in constipation and oxygenation   Intake/Output Summary (Last 24 hours) at 12/02/15 1518 Last data filed at 12/02/15 0900  Gross per 24 hour  Intake    240 ml  Output      0 ml  Net    240 ml   Filed Weights   11/28/15 0053  Weight: 138.347 kg (305 lb)    Objective: Physical Exam: Filed Vitals:   12/02/15 0735 12/02/15 1047 12/02/15 1109 12/02/15 1426  BP:  122/62  145/69  Pulse: 75 70 60 57  Temp:    98.4 F (36.9 C)  TempSrc:    Oral  Resp: 20  20 20   Height:      Weight:      SpO2: 95%  96% 99%     General: Appear in mild distress, no Rash; Oral Mucosa moist. Cardiovascular: S1 and S2 Present, no Murmur, no JVD Respiratory: Bilateral Air entry present and no Crackles, bilateral wheezes Abdomen: Bowel Sound present, Soft and no tenderness Extremities: no Pedal edema, no calf tenderness Neurology: Grossly no focal neuro deficit.  Data Reviewed: CBC:  Recent Labs Lab 11/28/15 0142  WBC 5.0  NEUTROABS 3.0  HGB 11.7*  HCT 34.2*  MCV 88.4  PLT 123456*   Basic Metabolic Panel:  Recent Labs Lab 11/28/15 0142 11/29/15 2350 12/01/15 0629  NA 139 138 137  K 4.0 5.2* 3.5  CL 103 105 100*  CO2 23 23 29   GLUCOSE 92 195* 150*  BUN 16 30* 27*  CREATININE 1.38* 1.43* 1.26*  CALCIUM 8.4* 8.2* 8.0*   Liver Function Tests:  Recent Labs Lab 12/01/15 1154  AST 97*  ALT 43  ALKPHOS 107  BILITOT 0.9  PROT 5.3*  ALBUMIN 1.6*   No results for input(s): LIPASE, AMYLASE in the last 168 hours. No results for input(s): AMMONIA in the last 168 hours.  Cardiac Enzymes:  Recent Labs Lab 11/28/15 0205  CKTOTAL 379*    BNP (last 3 results)  Recent Labs  11/28/15 0204  BNP 256.3*    CBG:  Recent Labs Lab 12/01/15 1124 12/01/15 1602 12/01/15 2305 12/02/15 0644  12/02/15 1114  GLUCAP 163* 177* 131* 121* 141*    Recent Results (from the past 240 hour(s))  Urine culture     Status: None   Collection Time: 11/28/15  4:45 AM  Result Value Ref Range Status   Specimen Description URINE, RANDOM  Final   Special Requests NONE  Final   Culture >=100,000 COLONIES/mL CITROBACTER FREUNDII  Final   Report Status 11/30/2015 FINAL  Final   Organism ID, Bacteria CITROBACTER FREUNDII  Final      Susceptibility   Citrobacter freundii - MIC*    CEFAZOLIN >=64 RESISTANT Resistant     CEFTRIAXONE <=1 SENSITIVE Sensitive     CIPROFLOXACIN <=0.25 SENSITIVE Sensitive     GENTAMICIN <=1 SENSITIVE Sensitive     IMIPENEM 0.5 SENSITIVE Sensitive     NITROFURANTOIN 128 RESISTANT Resistant     TRIMETH/SULFA <=20 SENSITIVE Sensitive     PIP/TAZO <=4  SENSITIVE Sensitive     * >=100,000 COLONIES/mL CITROBACTER FREUNDII     Studies: No results found.   Scheduled Meds: . acidophilus  1 capsule Oral Daily  . albuterol  2.5 mg Nebulization Q4H  . atenolol  25 mg Oral Daily  . cefTRIAXone (ROCEPHIN)  IV  2 g Intravenous Q24H  . enoxaparin (LOVENOX) injection  70 mg Subcutaneous Q24H  . gabapentin  300 mg Oral BID  . insulin aspart  0-15 Units Subcutaneous TID WC  . insulin glargine  15 Units Subcutaneous Daily  . levothyroxine  175 mcg Oral QAC breakfast  . magic mouthwash  5 mL Oral QID  . methylPREDNISolone (SOLU-MEDROL) injection  40 mg Intravenous Q12H  . metroNIDAZOLE  500 mg Oral 3 times per day  . oxybutynin  10 mg Oral BID  . PARoxetine  40 mg Oral Daily   Continuous Infusions:  PRN Meds: albuterol, polyethylene glycol, senna, tiZANidine  Time spent: 30 minutes  Author: Berle Mull, MD [facility] Pager: [phone] 12/02/2015 3:18 PM  If 7PM-7AM, please contact night-coverage at [email or web address], password [facility]

## 2015-12-02 NOTE — Progress Notes (Signed)
  Echocardiogram 2D Echocardiogram has been performed.  Gina Bender 12/02/2015, 1:04 PM

## 2015-12-02 NOTE — Care Management Important Message (Signed)
Important Message  Patient Details  Name: Gina Bender MRN: [id number] Date of Birth: 09-Sep-1950   Medicare Important Message Given:  Yes    Barb Merino [place] 12/02/2015, 10:32 AM

## 2015-12-02 NOTE — Progress Notes (Signed)
Physical Therapy Treatment Patient Details Name: Gina Bender MRN: [id number] DOB: 06/08/1951 Today's Date: 12/02/2015    History of Present Illness 65 y.o. female admitted to [facility] on 11/28/15 for SOB.  Dx with asthma exacerbation.  Pt wtih significant PMHx of back pain, asthma, DM, HTN, fibromyalgia, CA, recurrent UTIs and urinary urgency, morbid obesity, bil knee arthroscopy, and back surgery.  Of note, pt reports recent fall to right side and buttocks with inability to move well at home (only able to go to the bathroom, was not eating) since the fall. Per MD note there is no fracture.      PT Comments    Patient is gradually progressing toward mobility goals. 3L O2 via nasal canula with SpO2 remaining 87-93% throughout session. Current plan remains appropriate.   Follow Up Recommendations  SNF     Equipment Recommendations  None recommended by PT    Recommendations for Other Services       Precautions / Restrictions Precautions Precautions: Fall Precaution Comments: hitory of recent fall Restrictions Weight Bearing Restrictions: No    Mobility  Bed Mobility Overal bed mobility: Needs Assistance Bed Mobility: Supine to Sit     Supine to sit: Min assist;HOB elevated     General bed mobility comments: assist to scoot hips forward to EOB and maintain sitting balance with use of bedpad; cues for technique  Transfers Overall transfer level: Needs assistance Equipment used: Rolling walker (2 wheeled) Transfers: Sit to/from Stand Sit to Stand: Min assist;+2 safety/equipment         General transfer comment: assist to power up into standing; cues for hand placement  Ambulation/Gait Ambulation/Gait assistance: Min guard;+2 safety/equipment Ambulation Distance (Feet): 25 Feet Assistive device: Rolling walker (2 wheeled) Gait Pattern/deviations: Step-to pattern;Decreased step length - right;Decreased step length - left;Trunk flexed     General Gait Details: cues  for posture, position of RW, and breathing technique when feeling SOB   Stairs            Wheelchair Mobility    Modified Rankin (Stroke Patients Only)       Balance   Sitting-balance support: Single extremity supported;Feet supported Sitting balance-Leahy Scale: Fair     Standing balance support: Bilateral upper extremity supported Standing balance-Leahy Scale: Poor                      Cognition Arousal/Alertness: Awake/alert Behavior During Therapy: WFL for tasks assessed/performed Overall Cognitive Status: Within Functional Limits for tasks assessed                      Exercises      General Comments General comments (skin integrity, edema, etc.): 3L O2 via nasal canula with SpO2 remaining 87-93% throughout session      Pertinent Vitals/Pain Pain Assessment: No/denies pain Pain Intervention(s): Monitored during session;Repositioned    Home Living                      Prior Function            PT Goals (current goals can now be found in the care plan section) Acute Rehab PT Goals Patient Stated Goal: none stated Progress towards PT goals: Progressing toward goals    Frequency  Min 3X/week    PT Plan Current plan remains appropriate    Co-evaluation             End of Session Equipment Utilized During Treatment: Oxygen;Gait belt  Activity Tolerance: Patient limited by fatigue Patient left: in chair;with call bell/phone within reach;with chair alarm set;with family/visitor present     Time: [id number] PT Time Calculation (min) (ACUTE ONLY): 32 min  Charges:  $Gait Training: 8-22 mins $Therapeutic Activity: 8-22 mins                    G Codes:      [place] April, PTA Pager: [phone]   12/02/2015, 12:29 PM

## 2015-12-03 DIAGNOSIS — I5033 Acute on chronic diastolic (congestive) heart failure: Secondary | ICD-10-CM | POA: Diagnosis present

## 2015-12-03 LAB — COMPREHENSIVE METABOLIC PANEL
ALT: 49 U/L (ref 14–54)
ANION GAP: 6 (ref 5–15)
AST: 91 U/L — ABNORMAL HIGH (ref 15–41)
Albumin: 1.7 g/dL — ABNORMAL LOW (ref 3.5–5.0)
Alkaline Phosphatase: 124 U/L (ref 38–126)
BUN: 18 mg/dL (ref 6–20)
CHLORIDE: 100 mmol/L — AB (ref 101–111)
CO2: 33 mmol/L — AB (ref 22–32)
CREATININE: 0.97 mg/dL (ref 0.44–1.00)
Calcium: 8 mg/dL — ABNORMAL LOW (ref 8.9–10.3)
GFR, EST NON AFRICAN AMERICAN: 60 mL/min — AB (ref >60)
Glucose, Bld: 172 mg/dL — ABNORMAL HIGH (ref 65–99)
Potassium: 3.8 mmol/L (ref 3.5–5.1)
SODIUM: 139 mmol/L (ref 135–145)
Total Bilirubin: 1.3 mg/dL — ABNORMAL HIGH (ref 0.3–1.2)
Total Protein: 6.1 g/dL — ABNORMAL LOW (ref 6.5–8.1)

## 2015-12-03 LAB — GLUCOSE, CAPILLARY
Glucose-Capillary: 157 mg/dL — ABNORMAL HIGH (ref 65–99)
Glucose-Capillary: 178 mg/dL — ABNORMAL HIGH (ref 65–99)
Glucose-Capillary: 177 mg/dL — ABNORMAL HIGH (ref 65–99)

## 2015-12-03 LAB — PROTIME-INR
INR: 1.69 — AB (ref 0.00–1.49)
PROTHROMBIN TIME: 19.9 s — AB (ref 11.6–15.2)

## 2015-12-03 LAB — CBC WITH DIFFERENTIAL/PLATELET
Basophils Absolute: 0 10*3/uL (ref 0.0–0.1)
Basophils Relative: 0 %
EOS ABS: 0 10*3/uL (ref 0.0–0.7)
EOS PCT: 0 %
HCT: 32 % — ABNORMAL LOW (ref 36.0–46.0)
Hemoglobin: 11 g/dL — ABNORMAL LOW (ref 12.0–15.0)
LYMPHS ABS: 0.8 10*3/uL (ref 0.7–4.0)
LYMPHS PCT: 15 %
MCH: 30.2 pg (ref 26.0–34.0)
MCHC: 34.4 g/dL (ref 30.0–36.0)
MCV: 87.9 fL (ref 78.0–100.0)
MONO ABS: 0.6 10*3/uL (ref 0.1–1.0)
MONOS PCT: 11 %
Neutro Abs: 3.7 10*3/uL (ref 1.7–7.7)
Neutrophils Relative %: 73 %
PLATELETS: 100 10*3/uL — AB (ref 150–400)
RBC: 3.64 MIL/uL — ABNORMAL LOW (ref 3.87–5.11)
RDW: 22 % — AB (ref 11.5–15.5)
WBC: 5.1 10*3/uL (ref 4.0–10.5)

## 2015-12-03 LAB — MAGNESIUM: MAGNESIUM: 1.7 mg/dL (ref 1.7–2.4)

## 2015-12-03 MED ORDER — FUROSEMIDE 40 MG PO TABS
40.0000 mg | ORAL_TABLET | Freq: Every day | ORAL | Status: DC
  Administered 2015-12-03 – 2015-12-05 (×3): 40 mg via ORAL
  Filled 2015-12-03 (×3): qty 1

## 2015-12-03 MED ORDER — FUROSEMIDE 10 MG/ML IJ SOLN: 40.0000 mg | Freq: Once | INTRAMUSCULAR | Status: DC

## 2015-12-03 MED ORDER — FUROSEMIDE 10 MG/ML IJ SOLN: 40.0000 mg | Freq: Every day | INTRAMUSCULAR | Status: DC

## 2015-12-03 MED ORDER — ACETAMINOPHEN 325 MG PO TABS
650.0000 mg | ORAL_TABLET | Freq: Four times a day (QID) | ORAL | Status: DC | PRN
  Administered 2015-12-03 – 2015-12-13 (×5): 650 mg via ORAL
  Filled 2015-12-03 (×5): qty 2

## 2015-12-03 MED ORDER — ALBUTEROL SULFATE (2.5 MG/3ML) 0.083% IN NEBU
2.5000 mg | INHALATION_SOLUTION | Freq: Four times a day (QID) | RESPIRATORY_TRACT | Status: DC
  Administered 2015-12-03 – 2015-12-04 (×4): 2.5 mg via RESPIRATORY_TRACT
  Filled 2015-12-03 (×4): qty 3

## 2015-12-03 NOTE — Clinical Social Work Note (Addendum)
CSW received phone call from [facility] who said they can not take patient.  CSW contacted patient's husband to ask if he can choose a different facility for her to go to.  Patient's husband said he will look at the offers and call back CSW with decision.  CSW to continue to follow patient's progress.  CSW spoke to patient's husband Waunita Schooner [phone] in regards to patient's discharge plan, [facility] is now considering taking patient, however they have not made a final decision.  CSW told patient's husband that if [facility] does not take patient he will have to make a decision on a different facility.  CSW informed patient's husband that she may be ready tomorrow depending on what physician decides.  Patient's husband will inform CSW on Friday what his choice is if patient is not accepted at [facility].   Jones Broom. [place], MSW, [place] 12/03/2015 11:55 AM

## 2015-12-03 NOTE — Progress Notes (Signed)
Pt. put on cpap for h/s, tolerating well.

## 2015-12-03 NOTE — Clinical Social Work Placement (Signed)
   CLINICAL SOCIAL WORK PLACEMENT  NOTE  Date:  12/03/2015  Patient Details  Name: Gina Bender MRN: [id number] Date of Birth: 11-25-1950  Clinical Social Work is seeking post-discharge placement for this patient at the [place] level of care (*CSW will initial, date and re-position this form in  chart as items are completed):  Yes   Patient/family provided with [place] Work Department's list of facilities offering this level of care within the geographic area requested by the patient (or if unable, by the patient's family).  Yes   Patient/family informed of their freedom to choose among providers that offer the needed level of care, that participate in Medicare, Medicaid or managed care program needed by the patient, have an available bed and are willing to accept the patient.  Yes   Patient/family informed of [place]'s ownership interest in [facility] and [facility], as well as of the fact that they are under no obligation to receive care at these facilities.  PASRR submitted to EDS on 12/02/15     PASRR number received on 12/02/15     Existing PASRR number confirmed on       FL2 transmitted to all facilities in geographic area requested by pt/family on 12/02/15     FL2 transmitted to all facilities within larger geographic area on       Patient informed that his/her managed care company has contracts with or will negotiate with certain facilities, including the following:        Yes   Patient/family informed of bed offers received.  Patient chooses bed at       Physician recommends and patient chooses bed at      Patient to be transferred to   on  .  Patient to be transferred to facility by       Patient family notified on   of transfer.  Name of family member notified:        PHYSICIAN       Additional Comment:    _______________________________________________ Ross Ludwig, [place] 12/03/2015, 4:58  PM

## 2015-12-03 NOTE — Clinical Social Work Note (Signed)
Clinical Social Work Assessment  Patient Details  Name: Gina Bender MRN: [id number] Date of Birth: [birth year]  Date of referral:  12/02/15               Reason for consult:  Facility Placement                Permission sought to share information with:  Facility Sport and exercise psychologist, Family Supports Permission granted to share information::  Yes, Verbal Permission Granted  Name::     Gina, Bender [phone] or [phone]  Agency::  SNF Admissions  Relationship::     Contact Information:     Housing/Transportation Living arrangements for the past 2 months:  [place] of Information:  Patient, Spouse Patient Interpreter Needed:  None Criminal Activity/Legal Involvement Pertinent to Current Situation/Hospitalization:  No - Comment as needed Significant Relationships:  Spouse Lives with:  Spouse Do you feel safe going back to the place where you live?  No (Patient feels she needs short term rehab before she can return back home.) Need for family participation in patient care:  Yes (Comment) (Patient requests her husband help with decision making.)  Care giving concerns:  Patient and family would like to go to SNF for short term rehab.   Social Worker assessment / plan:  Patient is a 65 year old female who is married and lives with her husband.  Patient is alert and oriented x4 and able to express her opinions.  Patient expressed she has not been to rehab before, but she has family members who have been to [facility] in the past.  Patient and family are requesting to go to [facility] and they have spoke to SNF about having patient transfer to SNF.  Patient and husband were explained SNF search process and what to expect at SNF and day of discharge.  Patient's family were explained how insurance will pay for her stay and what to expect at SNF.  Patient's family did not have any other questions or concerns.  They expressed  hopefulness for patient to go to [facility].  Employment status:  Retired Nurse, adult PT Recommendations:  [place] / Referral to community resources:  [place]  Patient/Family's Response to care:  Patient and family in agreement to going to SNF for short term rehab.  Patient/Family's Understanding of and Emotional Response to Diagnosis, Current Treatment, and Prognosis:  Patient and family are aware of current diagnosis and treatment plan.  Emotional Assessment Appearance:  Appears older than stated age Attitude/Demeanor/Rapport:  Lethargic Affect (typically observed):  Appropriate, Calm, Quiet Orientation:  Oriented to Self, Oriented to Place, Oriented to  Time, Oriented to Situation Alcohol / Substance use:  Not Applicable Psych involvement (Current and /or in the community):  No (Comment)  Discharge Needs  Concerns to be addressed:  No discharge needs identified Readmission within the last 30 days:  No Current discharge risk:  None Barriers to Discharge:  No Barriers Identified   Ross Ludwig, LCSWA 12/03/2015, 4:52 PM

## 2015-12-03 NOTE — Progress Notes (Signed)
[facility] Progress Note  Patient: Gina Bender [id number]   PCP: Nyoka Cowden, MD DOB: 09/03/1951   DOA: 11/28/2015   DOS: 12/03/2015   Date of Service: the patient was seen and examined on 12/03/2015  Subjective: Patient is able to tolerate Z-Pak last night. Appears to be significantly awakened alert this morning and mentions she is feeling better regarding her breathing. Nutrition: Tolerating oral intake  Brief hospital course: Patient was admitted on 11/28/2015, with complaint of cough and shortness of breath, was found to have asthma exacerbation with acute hypoxic respiratory failure. Currently further plan is continue nebulizers and Solu-Medrol.  Assessment and Plan: 1. Asthma exacerbation Patient continues to remain on 4 L of oxygen. Bilateral expiratory wheezing is also present. With this I would continue Solu-Medrol 40 mg every 12 hours. Change DuoNeb to albuterol every 4 hours and ipratropium as needed.  2. Obesity hypoventilation syndrome, obstructive sleep apnea. The patient is willing to try C Pap again. Continue daily at bedtime  3. UTI. Recent C. difficile. Patient has been started on IV ceftriaxone on admission  Given her history of recurrent C. difficile I would cut short the treatment duration to 3 days since the patient does not have any significant urinary or abdominal symptoms. Also discontinue Flagyl since it was started only to prevent C. difficile infection.  4. Constipation. C. Difficile. Reportedly as per the nursing the patient has been incontinent and had a bowel movement on 12/02/2015. Requesting nursing to monitor and document bowel movement. The patient does not have any bowel movement in last 10 days. At present patient will be started on oral MiraLAX and Senokot. Monitor for any worsening symptoms.  5. Acute kidney injury. Hyperkalemia. Currently resolved. Likely from dehydration.  6. Chronic diastolic  dysfunction. Echogram shows grade 1 diastolic dysfunction with normal ejection fraction. No valvular dysfunction. Patient on Lasix at home. Now developing volume overload due to steroids. We will give her one dose of IV Lasix and resume oral Lasix starting tomorrow.  7. Type 2 diabetes mellitus. Hemoglobin A1c well controlled. Continue home regimen. Continue sliding scale in the setting of steroid-induced hyperglycemia  8. Hypothyroidism. Continue Synthroid.  9. Chronic pain syndrome. A diabetic neuropathy. Mood disorder Continue gabapentin continue Paxil continue Zanaflex.  Activity: physical therapy SNF Bowel regimen: last BM 10 days ago DVT Prophylaxis: subcutaneous Heparin Nutrition: Carb modified heart healthy diet Advance goals of care discussion: Full code  HPI: As per the H and P dictated on admission, "Gina Bender is a 65 y.o. female with pmh of asthma who is brought in by ambulance. She presents to the ED with c/o SOB onset 2 days PTA that recently worsened today. Associated cough and wheezing. No fever, chills. Inhalor provides no relief. No h/o COPD, does have h/o OSA with a CPAP machine that she dosent really use.  Patient also has h/o C.Diff 3 weeks prior, had been having diarrhea since then but hasnt had a BM in several days now.  Patient reports R hip pain since a fall a few days back. This wasn't fractured, but she is essentially bed bound at this point and struggling to take care of herself due to extreme obesity recently." Procedures: Echocardiogram Consultants: None Antibiotics: Anti-infectives    Start     Dose/Rate Route Frequency Ordered Stop   11/30/15 1500  metroNIDAZOLE (FLAGYL) tablet 500 mg  Status:  Discontinued     500 mg Oral 3 times per day 11/30/15 1346 12/02/15 1634   11/29/15  1530  cefTRIAXone (ROCEPHIN) 2 g in dextrose 5 % 50 mL IVPB  Status:  Discontinued     2 g 100 mL/hr over 30 Minutes Intravenous Every 24 hours 11/29/15 1424  12/02/15 1634   11/29/15 1315  doxycycline (VIBRAMYCIN) 100 mg in dextrose 5 % 250 mL IVPB  Status:  Discontinued     100 mg 125 mL/hr over 120 Minutes Intravenous Every 12 hours 11/29/15 1301 11/30/15 1346     Family Communication: family was present at bedside, at the time of interview.  Opportunity was given to ask question and all questions were answered satisfactorily.   Disposition:  Expected discharge date:12/04/2015 Barriers to safe discharge: Improvement in constipation and oxygenation   Intake/Output Summary (Last 24 hours) at 12/03/15 1322 Last data filed at 12/02/15 1630  Gross per 24 hour  Intake     50 ml  Output      0 ml  Net     50 ml   Filed Weights   11/28/15 0053  Weight: 138.347 kg (305 lb)    Objective: Physical Exam: Filed Vitals:   12/03/15 0300 12/03/15 0450 12/03/15 0754 12/03/15 1129  BP: 138/60     Pulse: 59 52    Temp: 97 F (36.1 C)     TempSrc:      Resp: 18 20    Height:      Weight:      SpO2: 98% 94% 96% 93%    General: Appear in mild distress, no Rash; Oral Mucosa moist. Cardiovascular: S1 and S2 Present, no Murmur, no JVD Respiratory: Bilateral Air entry present and no Crackles, bilateral wheezes Abdomen: Bowel Sound present, Soft and no tenderness Extremities: no Pedal edema, no calf tenderness  Data Reviewed: CBC:  Recent Labs Lab 11/28/15 0142 12/03/15 0725  WBC 5.0 5.1  NEUTROABS 3.0 3.7  HGB 11.7* 11.0*  HCT 34.2* 32.0*  MCV 88.4 87.9  PLT 113* 123XX123*   Basic Metabolic Panel:  Recent Labs Lab 11/28/15 0142 11/29/15 2350 12/01/15 0629 12/03/15 0725  NA 139 138 137 139  K 4.0 5.2* 3.5 3.8  CL 103 105 100* 100*  CO2 23 23 29  33*  GLUCOSE 92 195* 150* 172*  BUN 16 30* 27* 18  CREATININE 1.38* 1.43* 1.26* 0.97  CALCIUM 8.4* 8.2* 8.0* 8.0*  MG  --   --   --  1.7   Liver Function Tests:  Recent Labs Lab 12/01/15 1154 12/03/15 0725  AST 97* 91*  ALT 43 49  ALKPHOS 107 124  BILITOT 0.9 1.3*  PROT  5.3* 6.1*  ALBUMIN 1.6* 1.7*   No results for input(s): LIPASE, AMYLASE in the last 168 hours. No results for input(s): AMMONIA in the last 168 hours.  Cardiac Enzymes:  Recent Labs Lab 11/28/15 0205  CKTOTAL 379*    BNP (last 3 results)  Recent Labs  11/28/15 0204  BNP 256.3*    CBG:  Recent Labs Lab 12/02/15 0644 12/02/15 1114 12/02/15 1559 12/02/15 2215 12/03/15 0647  GLUCAP 121* 141* 155* 188* 157*    Recent Results (from the past 240 hour(s))  Urine culture     Status: None   Collection Time: 11/28/15  4:45 AM  Result Value Ref Range Status   Specimen Description URINE, RANDOM  Final   Special Requests NONE  Final   Culture >=100,000 COLONIES/mL CITROBACTER FREUNDII  Final   Report Status 11/30/2015 FINAL  Final   Organism ID, Bacteria CITROBACTER FREUNDII  Final  Susceptibility   Citrobacter freundii - MIC*    CEFAZOLIN >=64 RESISTANT Resistant     CEFTRIAXONE <=1 SENSITIVE Sensitive     CIPROFLOXACIN <=0.25 SENSITIVE Sensitive     GENTAMICIN <=1 SENSITIVE Sensitive     IMIPENEM 0.5 SENSITIVE Sensitive     NITROFURANTOIN 128 RESISTANT Resistant     TRIMETH/SULFA <=20 SENSITIVE Sensitive     PIP/TAZO <=4 SENSITIVE Sensitive     * >=100,000 COLONIES/mL CITROBACTER FREUNDII     Studies: No results found.   Scheduled Meds: . acidophilus  1 capsule Oral Daily  . albuterol  2.5 mg Nebulization QID  . atenolol  25 mg Oral Daily  . enoxaparin (LOVENOX) injection  70 mg Subcutaneous Q24H  . furosemide  40 mg Intravenous Once  . [START ON 12/04/2015] furosemide  40 mg Oral Daily  . gabapentin  300 mg Oral BID  . insulin aspart  0-15 Units Subcutaneous TID WC  . insulin glargine  15 Units Subcutaneous Daily  . levothyroxine  175 mcg Oral QAC breakfast  . magic mouthwash  5 mL Oral QID  . methylPREDNISolone (SOLU-MEDROL) injection  40 mg Intravenous Q12H  . oxybutynin  10 mg Oral BID  . PARoxetine  40 mg Oral Daily  . polyethylene glycol  17 g  Oral Daily  . senna  1 tablet Oral QHS   Continuous Infusions:  PRN Meds: albuterol, ipratropium, tiZANidine  Time spent: 30 minutes  Author: Berle Mull, MD [facility] Pager: [phone] 12/03/2015 1:22 PM  If 7PM-7AM, please contact night-coverage at [email or web address], password [facility]

## 2015-12-04 ENCOUNTER — Inpatient Hospital Stay (HOSPITAL_COMMUNITY): Payer: Medicare Other

## 2015-12-04 LAB — BLOOD GAS, ARTERIAL
ACID-BASE EXCESS: 6.1 mmol/L — AB (ref 0.0–2.0)
BICARBONATE: 29.8 meq/L — AB (ref 20.0–24.0)
Drawn by: 235881
O2 Content: 2 L/min
O2 SAT: 91.8 %
PATIENT TEMPERATURE: 98.6
PO2 ART: 66.8 mmHg — AB (ref 80.0–100.0)
TCO2: 31.1 mmol/L (ref 0–100)
pCO2 arterial: 40.7 mmHg (ref 35.0–45.0)
pH, Arterial: 7.478 — ABNORMAL HIGH (ref 7.350–7.450)

## 2015-12-04 LAB — BASIC METABOLIC PANEL
Anion gap: 7 (ref 5–15)
BUN: 17 mg/dL (ref 6–20)
CHLORIDE: 102 mmol/L (ref 101–111)
CO2: 29 mmol/L (ref 22–32)
Calcium: 7.9 mg/dL — ABNORMAL LOW (ref 8.9–10.3)
Creatinine, Ser: 0.72 mg/dL (ref 0.44–1.00)
GFR calc Af Amer: >60 mL/min (ref >60)
GFR calc non Af Amer: >60 mL/min (ref >60)
Glucose, Bld: 136 mg/dL — ABNORMAL HIGH (ref 65–99)
POTASSIUM: 3.5 mmol/L (ref 3.5–5.1)
SODIUM: 138 mmol/L (ref 135–145)

## 2015-12-04 LAB — CBC
HEMATOCRIT: 30.6 % — AB (ref 36.0–46.0)
HEMOGLOBIN: 10.6 g/dL — AB (ref 12.0–15.0)
MCH: 30 pg (ref 26.0–34.0)
MCHC: 34.6 g/dL (ref 30.0–36.0)
MCV: 86.7 fL (ref 78.0–100.0)
Platelets: 131 10*3/uL — ABNORMAL LOW (ref 150–400)
RBC: 3.53 MIL/uL — AB (ref 3.87–5.11)
RDW: 21.8 % — ABNORMAL HIGH (ref 11.5–15.5)
WBC: 8.1 10*3/uL (ref 4.0–10.5)

## 2015-12-04 LAB — GLUCOSE, CAPILLARY
GLUCOSE-CAPILLARY: 256 mg/dL — AB (ref 65–99)
GLUCOSE-CAPILLARY: 171 mg/dL — AB (ref 65–99)
Glucose-Capillary: 124 mg/dL — ABNORMAL HIGH (ref 65–99)
Glucose-Capillary: 192 mg/dL — ABNORMAL HIGH (ref 65–99)

## 2015-12-04 LAB — INFLUENZA PANEL BY PCR (TYPE A & B)
H1N1FLUPCR: NOT DETECTED
Influenza A By PCR: POSITIVE — AB
Influenza B By PCR: NEGATIVE

## 2015-12-04 LAB — MAGNESIUM: MAGNESIUM: 1.6 mg/dL — AB (ref 1.7–2.4)

## 2015-12-04 MED ORDER — POLYETHYLENE GLYCOL 3350 17 G PO PACK
17.0000 g | PACK | Freq: Two times a day (BID) | ORAL | Status: DC
  Administered 2015-12-05 – 2015-12-15 (×10): 17 g via ORAL
  Filled 2015-12-04 (×17): qty 1

## 2015-12-04 MED ORDER — ISOSORBIDE MONONITRATE ER 30 MG PO TB24
30.0000 mg | ORAL_TABLET | Freq: Every day | ORAL | Status: DC
  Administered 2015-12-04 – 2015-12-15 (×12): 30 mg via ORAL
  Filled 2015-12-04 (×12): qty 1

## 2015-12-04 MED ORDER — HYDRALAZINE HCL 25 MG PO TABS
25.0000 mg | ORAL_TABLET | Freq: Three times a day (TID) | ORAL | Status: DC
  Administered 2015-12-04 – 2015-12-15 (×31): 25 mg via ORAL
  Filled 2015-12-04 (×33): qty 1

## 2015-12-04 MED ORDER — SENNOSIDES-DOCUSATE SODIUM 8.6-50 MG PO TABS
1.0000 | ORAL_TABLET | Freq: Two times a day (BID) | ORAL | Status: DC
  Administered 2015-12-04 – 2015-12-15 (×17): 1 via ORAL
  Filled 2015-12-04 (×20): qty 1

## 2015-12-04 MED ORDER — OSELTAMIVIR PHOSPHATE 75 MG PO CAPS
75.0000 mg | ORAL_CAPSULE | Freq: Two times a day (BID) | ORAL | Status: AC
  Administered 2015-12-04 – 2015-12-09 (×9): 75 mg via ORAL
  Filled 2015-12-04 (×13): qty 1

## 2015-12-04 MED ORDER — ALBUTEROL SULFATE (2.5 MG/3ML) 0.083% IN NEBU
3.0000 mL | INHALATION_SOLUTION | RESPIRATORY_TRACT | Status: DC | PRN
Start: 2015-12-04 — End: 2015-12-15

## 2015-12-04 MED ORDER — MORPHINE SULFATE (PF) 2 MG/ML IV SOLN
1.0000 mg | Freq: Once | INTRAVENOUS | Status: AC
  Administered 2015-12-04: 1 mg via INTRAVENOUS
  Filled 2015-12-04: qty 1

## 2015-12-04 MED ORDER — METHYLPREDNISOLONE SODIUM SUCC 125 MG IJ SOLR
60.0000 mg | Freq: Four times a day (QID) | INTRAMUSCULAR | Status: DC
  Administered 2015-12-04 – 2015-12-06 (×8): 60 mg via INTRAVENOUS
  Filled 2015-12-04 (×7): qty 2

## 2015-12-04 MED ORDER — IPRATROPIUM-ALBUTEROL 0.5-2.5 (3) MG/3ML IN SOLN
3.0000 mL | Freq: Four times a day (QID) | RESPIRATORY_TRACT | Status: DC
  Administered 2015-12-05 – 2015-12-08 (×11): 3 mL via RESPIRATORY_TRACT
  Filled 2015-12-04 (×12): qty 3

## 2015-12-04 MED ORDER — IPRATROPIUM-ALBUTEROL 0.5-2.5 (3) MG/3ML IN SOLN
3.0000 mL | RESPIRATORY_TRACT | Status: DC
  Administered 2015-12-04 (×3): 3 mL via RESPIRATORY_TRACT
  Filled 2015-12-04 (×3): qty 3

## 2015-12-04 NOTE — Clinical Social Work Note (Signed)
Patient has bed at [facility]. Per MD, patient is not medically stable for d/c today.  CSW remains available as needed.   Glendon Axe, MSW, LCSWA [phone] 12/04/2015 10:26 AM

## 2015-12-04 NOTE — Progress Notes (Signed)
Duoneb Tx changed to QID per Rt protocol. Pt wears CPAP at night.  Albuterol Q4 PRN available during night if pt needs it.

## 2015-12-04 NOTE — Progress Notes (Signed)
[facility] Progress Note  Patient: Gina Bender [id number]   PCP: Nyoka Cowden, MD DOB: 04-Oct-1950   DOA: 11/28/2015   DOS: 12/04/2015   Date of Service: the patient was seen and examined on 12/04/2015  Subjective: Continues to tolerate the CPAP every night. Denies having any chest pain. Denies having any bowel movement. Has been incontinent of urine. Shortness of breath is about the same. Cough is about the same no chest pain Nutrition: Tolerating oral intake  Brief hospital course: Patient was admitted on 11/28/2015, with complaint of cough and shortness of breath, was found to have asthma exacerbation with acute hypoxic respiratory failure. Currently further plan is continue nebulizers and Solu-Medrol.  Assessment and Plan: 1. Asthma exacerbation Patient continues to remain on 4 L of oxygen. With persistent expiratory wheezing without any improvement I will increase the Solu-Medrol to 60 mg every 6 hours. I would check an ABG check an x-ray. Also increase patient to DuoNeb every 4 hours. Check influenza PCR check sputum culture.  2. Obesity hypoventilation syndrome, obstructive sleep apnea. The patient is tolerating C Pap. Continue daily at bedtime.  3. UTI. Recent C. difficile. Patient has been started on IV ceftriaxone on admission  Completed 3 days of treatment with antibiotics. Patient was given Flagyl to prevent C. difficile infection which is now stopped.  4. Constipation. C. Difficile. Reportedly as per the nursing the patient has been incontinent and had a bowel movement on 12/02/2015. Requesting nursing to monitor and document bowel movement. The patient does not have any bowel movement in last 10 days. At present patient increase oral MiraLAX and Senokot. Monitor for any worsening symptoms.  5. Acute kidney injury. Hyperkalemia. Currently resolved. Likely from dehydration. Now back on Lasix  6. Chronic diastolic dysfunction. Sinus  bradycardia Essential hypertension Echogram shows grade 1 diastolic dysfunction with normal ejection fraction. No valvular dysfunction. Patient on Lasix at home. Now developing volume overload due to steroids. Continue oral Lasix I will discontinue atenolol. Patient may benefit from Imdur hydralazine combination. Due to recent AKI with hyperkalemia avoiding ACE inhibitor or ARB.  7. Type 2 diabetes mellitus. Hemoglobin A1c well controlled. Continue home regimen. Continue sliding scale in the setting of steroid-induced hyperglycemia  8. Hypothyroidism. Continue Synthroid.  9. Chronic pain syndrome. A diabetic neuropathy. Mood disorder Continue gabapentin continue Paxil continue Zanaflex.  Activity: physical therapy SNF Bowel regimen: last BM 10 days ago DVT Prophylaxis: subcutaneous Heparin Nutrition: Carb modified heart healthy diet Advance goals of care discussion: Full code  HPI: As per the H and P dictated on admission, "Gina Bender is a 65 y.o. female with pmh of asthma who is brought in by ambulance. She presents to the ED with c/o SOB onset 2 days PTA that recently worsened today. Associated cough and wheezing. No fever, chills. Inhalor provides no relief. No h/o COPD, does have h/o OSA with a CPAP machine that she dosent really use.  Patient also has h/o C.Diff 3 weeks prior, had been having diarrhea since then but hasnt had a BM in several days now.  Patient reports R hip pain since a fall a few days back. This wasn't fractured, but she is essentially bed bound at this point and struggling to take care of herself due to extreme obesity recently." Procedures: Echocardiogram Consultants: None Antibiotics: Anti-infectives    Start     Dose/Rate Route Frequency Ordered Stop   11/30/15 1500  metroNIDAZOLE (FLAGYL) tablet 500 mg  Status:  Discontinued  500 mg Oral 3 times per day 11/30/15 1346 12/02/15 1634   11/29/15 1530  cefTRIAXone (ROCEPHIN) 2 g in  dextrose 5 % 50 mL IVPB  Status:  Discontinued     2 g 100 mL/hr over 30 Minutes Intravenous Every 24 hours 11/29/15 1424 12/02/15 1634   11/29/15 1315  doxycycline (VIBRAMYCIN) 100 mg in dextrose 5 % 250 mL IVPB  Status:  Discontinued     100 mg 125 mL/hr over 120 Minutes Intravenous Every 12 hours 11/29/15 1301 11/30/15 1346     Family Communication: family was present at bedside, at the time of interview.  Opportunity was given to ask question and all questions were answered satisfactorily.   Disposition:  Expected discharge date: 12/07/2015 Barriers to safe discharge: Improvement in constipation and oxygenation   Intake/Output Summary (Last 24 hours) at 12/04/15 1310 Last data filed at 12/04/15 1100  Gross per 24 hour  Intake    995 ml  Output      0 ml  Net    995 ml   Filed Weights   11/28/15 0053  Weight: 138.347 kg (305 lb)    Objective: Physical Exam: Filed Vitals:   12/03/15 2114 12/04/15 0604 12/04/15 0737 12/04/15 1143  BP: 141/68 147/74    Pulse: 62 61    Temp: 98.8 F (37.1 C) 98.6 F (37 C)    TempSrc: Oral Oral    Resp: 18 18    Height:      Weight:      SpO2: 95% 94% 94% 92%    General: Appear in mild distress, no Rash; Oral Mucosa moist. Cardiovascular: S1 and S2 Present, no Murmur, no JVD Respiratory: Bilateral Air entry present and no Crackles, bilateral wheezes Abdomen: Bowel Sound present, Soft and no tenderness Extremities: no Pedal edema, no calf tenderness  Data Reviewed: CBC:  Recent Labs Lab 11/28/15 0142 12/03/15 0725 12/04/15 0700  WBC 5.0 5.1 8.1  NEUTROABS 3.0 3.7  --   HGB 11.7* 11.0* 10.6*  HCT 34.2* 32.0* 30.6*  MCV 88.4 87.9 86.7  PLT 113* 100* [id number]*   Basic Metabolic Panel:  Recent Labs Lab 11/28/15 0142 11/29/15 2350 12/01/15 0629 12/03/15 0725 12/04/15 0700  NA 139 138 137 139 138  K 4.0 5.2* 3.5 3.8 3.5  CL 103 105 100* 100* 102  CO2 23 23 29  33* 29  GLUCOSE 92 195* 150* 172* 136*  BUN 16 30* 27* 18 17   CREATININE 1.38* 1.43* 1.26* 0.97 0.72  CALCIUM 8.4* 8.2* 8.0* 8.0* 7.9*  MG  --   --   --  1.7 1.6*   Liver Function Tests:  Recent Labs Lab 12/01/15 1154 12/03/15 0725  AST 97* 91*  ALT 43 49  ALKPHOS 107 124  BILITOT 0.9 1.3*  PROT 5.3* 6.1*  ALBUMIN 1.6* 1.7*   No results for input(s): LIPASE, AMYLASE in the last 168 hours. No results for input(s): AMMONIA in the last 168 hours.  Cardiac Enzymes:  Recent Labs Lab 11/28/15 0205  CKTOTAL 379*    BNP (last 3 results)  Recent Labs  11/28/15 0204  BNP 256.3*    CBG:  Recent Labs Lab 12/03/15 0647 12/03/15 1704 12/03/15 2152 12/04/15 0641 12/04/15 1142  GLUCAP 157* 178* 177* 124* 192*    Recent Results (from the past 240 hour(s))  Urine culture     Status: None   Collection Time: 11/28/15  4:45 AM  Result Value Ref Range Status   Specimen Description URINE, RANDOM  Final   Special Requests NONE  Final   Culture >=100,000 COLONIES/mL CITROBACTER FREUNDII  Final   Report Status 11/30/2015 FINAL  Final   Organism ID, Bacteria CITROBACTER FREUNDII  Final      Susceptibility   Citrobacter freundii - MIC*    CEFAZOLIN >=64 RESISTANT Resistant     CEFTRIAXONE <=1 SENSITIVE Sensitive     CIPROFLOXACIN <=0.25 SENSITIVE Sensitive     GENTAMICIN <=1 SENSITIVE Sensitive     IMIPENEM 0.5 SENSITIVE Sensitive     NITROFURANTOIN 128 RESISTANT Resistant     TRIMETH/SULFA <=20 SENSITIVE Sensitive     PIP/TAZO <=4 SENSITIVE Sensitive     * >=100,000 COLONIES/mL CITROBACTER FREUNDII     Studies: No results found.   Scheduled Meds: . acidophilus  1 capsule Oral Daily  . enoxaparin (LOVENOX) injection  70 mg Subcutaneous Q24H  . furosemide  40 mg Oral Daily  . gabapentin  300 mg Oral BID  . insulin aspart  0-15 Units Subcutaneous TID WC  . insulin glargine  15 Units Subcutaneous Daily  . ipratropium-albuterol  3 mL Nebulization Q4H  . levothyroxine  175 mcg Oral QAC breakfast  . magic mouthwash  5 mL Oral  QID  . methylPREDNISolone (SOLU-MEDROL) injection  60 mg Intravenous Q6H  . oxybutynin  10 mg Oral BID  . PARoxetine  40 mg Oral Daily  . polyethylene glycol  17 g Oral BID  . senna-docusate  1 tablet Oral BID   Continuous Infusions:  PRN Meds: acetaminophen, albuterol, tiZANidine  Time spent: 30 minutes  Author: Berle Mull, MD [facility] Pager: [phone] 12/04/2015 1:10 PM  If 7PM-7AM, please contact night-coverage at [email or web address], password [facility]

## 2015-12-04 NOTE — Progress Notes (Signed)
Physical Therapy Treatment Patient Details Name: Gina Bender MRN: [id number] DOB: [birth year] Today's Date: 12/04/2015    History of Present Illness 65 y.o. female admitted to [facility] on 11/28/15 for SOB.  Dx with asthma exacerbation.  Pt wtih significant PMHx of back pain, asthma, DM, HTN, fibromyalgia, CA, recurrent UTIs and urinary urgency, morbid obesity, bil knee arthroscopy, and back surgery.  Of note, pt reports recent fall to right side and buttocks with inability to move well at home (only able to go to the bathroom, was not eating) since the fall. Per MD note there is no fracture.      PT Comments    Patient is making gradual progress toward mobility goals with increased activity tolerance this session. SpO2 between 86-93% throughout session on 4L O2 via nasal canula with drop when performing bed mobility and ambulation.   Follow Up Recommendations  SNF     Equipment Recommendations  None recommended by PT    Recommendations for Other Services       Precautions / Restrictions Precautions Precautions: Fall Precaution Comments: hitory of recent fall Restrictions Weight Bearing Restrictions: No    Mobility  Bed Mobility Overal bed mobility: Needs Assistance Bed Mobility: Supine to Sit     Supine to sit: HOB elevated;Min guard     General bed mobility comments: min guard for safety; HOB elevated and heavy use of bedrails; cues for hand placement and technique; increased time needed  Transfers Overall transfer level: Needs assistance Equipment used: Rolling walker (2 wheeled) Transfers: Sit to/from Stand Sit to Stand: Min assist Stand pivot transfers: Min guard;+2 physical assistance;+2 safety/equipment       General transfer comment: assis to power up into standing from EOB and BSC; cues for hand placement and technique  Ambulation/Gait Ambulation/Gait assistance: Min guard Ambulation Distance (Feet): 50 Feet (25X2) Assistive device: Rolling walker (2  wheeled) Gait Pattern/deviations: Step-through pattern;Decreased stride length;Trunk flexed     General Gait Details: cues for posture and breathing technique with SOB; one seated rest break with SpO2 86% at break and increased to 91% with ~45 seconds of pursed lip breathing   Stairs            Wheelchair Mobility    Modified Rankin (Stroke Patients Only)       Balance   Sitting-balance support: Bilateral upper extremity supported;Feet supported Sitting balance-Leahy Scale: Fair     Standing balance support: Bilateral upper extremity supported Standing balance-Leahy Scale: Poor                      Cognition Arousal/Alertness: Awake/alert Behavior During Therapy: WFL for tasks assessed/performed Overall Cognitive Status: Within Functional Limits for tasks assessed                      Exercises      General Comments General comments (skin integrity, edema, etc.): SpO2 between 86-93% throughout sessoin on 4L O2 via nasal canula      Pertinent Vitals/Pain Pain Assessment: Faces Faces Pain Scale: Hurts a little bit Pain Location: low back with bed mobility Pain Descriptors / Indicators: Sore Pain Intervention(s): Monitored during session;Repositioned    Home Living                      Prior Function            PT Goals (current goals can now be found in the care plan section) Acute Rehab PT Goals  Patient Stated Goal: none stated Progress towards PT goals: Progressing toward goals    Frequency  Min 3X/week    PT Plan Current plan remains appropriate    Co-evaluation   Reason for Co-Treatment: For patient/therapist safety PT goals addressed during session: Mobility/safety with mobility;Proper use of DME OT goals addressed during session: ADL's and self-care     End of Session Equipment Utilized During Treatment: Oxygen;Gait belt Activity Tolerance: Patient limited by fatigue Patient left: in chair;with call bell/phone  within reach;with chair alarm set     Time: [id number] PT Time Calculation (min) (ACUTE ONLY): 32 min  Charges:  $Gait Training: 8-22 mins $Therapeutic Activity: 8-22 mins                    G Codes:      Salina April, PTA Pager: [phone]   12/04/2015, 11:18 AM

## 2015-12-04 NOTE — Progress Notes (Signed)
Occupational Therapy Treatment Patient Details Name: Gina Bender MRN: [id number] DOB: [birth year] Today's Date: 12/04/2015    History of present illness 65 y.o. female admitted to [facility] on 11/28/15 for SOB.  Dx with asthma exacerbation.  Pt wtih significant PMHx of back pain, asthma, DM, HTN, fibromyalgia, CA, recurrent UTIs and urinary urgency, morbid obesity, bil knee arthroscopy, and back surgery.  Of note, pt reports recent fall to right side and buttocks with inability to move well at home (only able to go to the bathroom, was not eating) since the fall. Per MD note there is no fracture.     OT comments  Pt. Able to complete all aspects of toileting this session with use of bariatric bsc min guard A.  Focus of next session is introduction and review of HEP for UB strengthening.    Follow Up Recommendations  SNF    Equipment Recommendations       Recommendations for Other Services      Precautions / Restrictions Precautions Precautions: Fall Precaution Comments: hitory of recent fall Restrictions Weight Bearing Restrictions: No       Mobility Bed Mobility Overal bed mobility: Needs Assistance                Transfers Overall transfer level: Needs assistance Equipment used: Rolling walker (2 wheeled) Transfers: Sit to/from Stand;Stand Pivot Transfers Sit to Stand: +2 physical assistance;+2 safety/equipment;Min guard Stand pivot transfers: Min guard;+2 physical assistance;+2 safety/equipment       General transfer comment: assist to power up into standing; cues for hand placement    Balance                                   ADL Overall ADL's : Needs assistance/impaired                         Toilet Transfer: Min guard;Stand-pivot;RW;Requires wide/bariatric;BSC   Toileting- Clothing Manipulation and Hygiene: Sitting/lateral lean;Set up         General ADL Comments: pt. on bsc upon arrival into room.  provided toilet paper and  pt. states she performs pericare standing, then would only perform in sitting with lateral leans.  rest breaks required after each wipe due to SOB after reaching back to buttocks      Vision                     Perception     Praxis      Cognition   Behavior During Therapy: [facility] for tasks assessed/performed Overall Cognitive Status: Within Functional Limits for tasks assessed                       Extremity/Trunk Assessment               Exercises     Shoulder Instructions       General Comments      Pertinent Vitals/ Pain       Pain Assessment: No/denies pain  Home Living                                          Prior Functioning/Environment              Frequency Min 2X/week     Progress  Toward Goals  OT Goals(current goals can now be found in the care plan section)  Progress towards OT goals: Progressing toward goals     Plan Discharge plan remains appropriate    Co-evaluation    PT/OT/SLP Co-Evaluation/Treatment: Yes Reason for Co-Treatment: For patient/therapist safety   OT goals addressed during session: ADL's and self-care      End of Session Equipment Utilized During Treatment: Gait belt;Rolling walker;Oxygen   Activity Tolerance Patient tolerated treatment well   Patient Left in chair;Other (comment) (with PTA and rehab tech to finish therapy session)   Nurse Communication          Time: [phone] OT Time Calculation (min): 13 min  Charges: OT General Charges $OT Visit: 1 Procedure OT Treatments $Self Care/Home Management : 8-22 mins  Janice Coffin , COTA/L  12/04/2015, 9:08 AM

## 2015-12-05 ENCOUNTER — Inpatient Hospital Stay (HOSPITAL_COMMUNITY): Payer: Medicare Other

## 2015-12-05 DIAGNOSIS — J101 Influenza due to other identified influenza virus with other respiratory manifestations: Secondary | ICD-10-CM | POA: Diagnosis present

## 2015-12-05 LAB — GLUCOSE, CAPILLARY
GLUCOSE-CAPILLARY: 155 mg/dL — AB (ref 65–99)
Glucose-Capillary: 141 mg/dL — ABNORMAL HIGH (ref 65–99)
Glucose-Capillary: 172 mg/dL — ABNORMAL HIGH (ref 65–99)
Glucose-Capillary: 176 mg/dL — ABNORMAL HIGH (ref 65–99)

## 2015-12-05 LAB — CBC WITH DIFFERENTIAL/PLATELET
Basophils Absolute: 0 10*3/uL (ref 0.0–0.1)
Basophils Relative: 0 %
EOS PCT: 0 %
Eosinophils Absolute: 0 10*3/uL (ref 0.0–0.7)
HEMATOCRIT: 31.5 % — AB (ref 36.0–46.0)
Hemoglobin: 10.3 g/dL — ABNORMAL LOW (ref 12.0–15.0)
LYMPHS ABS: 0.9 10*3/uL (ref 0.7–4.0)
Lymphocytes Relative: 13 %
MCH: 28.6 pg (ref 26.0–34.0)
MCHC: 32.7 g/dL (ref 30.0–36.0)
MCV: 87.5 fL (ref 78.0–100.0)
MONOS PCT: 10 %
Monocytes Absolute: 0.7 10*3/uL (ref 0.1–1.0)
NEUTROS ABS: 5.5 10*3/uL (ref 1.7–7.7)
Neutrophils Relative %: 77 %
PLATELETS: 159 10*3/uL (ref 150–400)
RBC: 3.6 MIL/uL — ABNORMAL LOW (ref 3.87–5.11)
RDW: 21.9 % — AB (ref 11.5–15.5)
WBC: 7.1 10*3/uL (ref 4.0–10.5)

## 2015-12-05 LAB — TROPONIN I
TROPONIN I: 0.03 ng/mL (ref <0.031)
Troponin I: 0.03 ng/mL (ref <0.031)
Troponin I: <0.03 ng/mL (ref <0.031)

## 2015-12-05 LAB — MAGNESIUM: Magnesium: 1.6 mg/dL — ABNORMAL LOW (ref 1.7–2.4)

## 2015-12-05 LAB — BASIC METABOLIC PANEL
Anion gap: 6 (ref 5–15)
BUN: 18 mg/dL (ref 6–20)
CALCIUM: 7.9 mg/dL — AB (ref 8.9–10.3)
CO2: 31 mmol/L (ref 22–32)
CREATININE: 0.91 mg/dL (ref 0.44–1.00)
Chloride: 100 mmol/L — ABNORMAL LOW (ref 101–111)
GFR calc Af Amer: >60 mL/min (ref >60)
GLUCOSE: 146 mg/dL — AB (ref 65–99)
POTASSIUM: 3.7 mmol/L (ref 3.5–5.1)
Sodium: 137 mmol/L (ref 135–145)

## 2015-12-05 MED ORDER — MAGNESIUM SULFATE 2 GM/50ML IV SOLN
2.0000 g | Freq: Once | INTRAVENOUS | Status: AC
  Administered 2015-12-05: 2 g via INTRAVENOUS
  Filled 2015-12-05: qty 50

## 2015-12-05 MED ORDER — NITROGLYCERIN 0.4 MG SL SUBL
0.4000 mg | SUBLINGUAL_TABLET | SUBLINGUAL | Status: DC | PRN
  Administered 2015-12-05: 0.4 mg via SUBLINGUAL

## 2015-12-05 MED ORDER — NITROGLYCERIN 0.4 MG SL SUBL
SUBLINGUAL_TABLET | SUBLINGUAL | Status: AC
  Filled 2015-12-05: qty 1

## 2015-12-05 MED ORDER — MORPHINE SULFATE (PF) 2 MG/ML IV SOLN: 1.0000 mg | Freq: Once | INTRAVENOUS | Status: DC

## 2015-12-05 MED ORDER — FUROSEMIDE 10 MG/ML IJ SOLN
40.0000 mg | Freq: Every day | INTRAMUSCULAR | Status: DC
  Administered 2015-12-05: 40 mg via INTRAVENOUS
  Filled 2015-12-05: qty 4

## 2015-12-05 NOTE — Progress Notes (Signed)
[facility] Progress Note  Patient: Gina Bender [id number]   PCP: Nyoka Cowden, MD DOB: December 29, 1950   DOA: 11/28/2015   DOS: 12/05/2015   Date of Service: the patient was seen and examined on 12/05/2015  Subjective: Patient complained of right-sided chest pain earlier in the morning. Denies having any acute complaint. Denies any significant improvement in her breathing. Nutrition: Tolerating oral intake  Brief hospital course: Patient was admitted on 11/28/2015, with complaint of cough and shortness of breath, was found to have asthma exacerbation with acute hypoxic respiratory failure. On 12/04/2015 influenza PCR came back positive. Currently further plan is continue nebulizers and Solu-Medrol.  Assessment and Plan: 1. Asthma exacerbation  Influenza A Patient continues to remain on 4 L of oxygen. With persistent expiratory wheezing without any improvement I will increase the Solu-Medrol to 60 mg every 6 hours. ABG shows mild hypoxia. Patient on Tamiflu. Continue duo nebs. If patient does not show any signs of improvement Orland Mustard will consult pulmonary.  2. Obesity hypoventilation syndrome, obstructive sleep apnea. The patient is tolerating C Pap. Continue daily at bedtime.  3. UTI. Recent C. difficile. Patient has been started on IV ceftriaxone on admission  Completed 3 days of treatment with antibiotics. Patient was given Flagyl to prevent C. difficile infection which is now stopped.  4. Constipation. C. Difficile. At present resolved and no diarrhea reported Continue oral MiraLAX and Senokot. Monitor for any worsening symptoms.  5. Acute kidney injury. Hyperkalemia. Currently resolved. Likely from dehydration. Now back on Lasix  6. Acute on Chronic diastolic dysfunction. Sinus bradycardia Essential hypertension Echogram shows grade 1 diastolic dysfunction with normal ejection fraction. No valvular dysfunction. With increasing swelling most likely  secondary from steroids I would switch the patient to IV Lasix. I will discontinue atenolol, due to bradycardia. On Imdur hydralazine combination. Due to recent AKI with hyperkalemia avoiding ACE inhibitor or ARB.  7. Type 2 diabetes mellitus. Hemoglobin A1c well controlled. Continue home regimen. Continue sliding scale in the setting of steroid-induced hyperglycemia  8. Hypothyroidism. Continue Synthroid.  9. Chronic pain syndrome. A diabetic neuropathy. Mood disorder Continue gabapentin continue Paxil continue Zanaflex.  Activity: physical therapy SNF Bowel regimen: 12/04/2015  DVT Prophylaxis: subcutaneous Heparin Nutrition: Carb modified heart healthy diet Advance goals of care discussion: Full code  HPI: As per the H and P dictated on admission, "Gina Bender is a 65 y.o. female with pmh of asthma who is brought in by ambulance. She presents to the ED with c/o SOB onset 2 days PTA that recently worsened today. Associated cough and wheezing. No fever, chills. Inhalor provides no relief. No h/o COPD, does have h/o OSA with a CPAP machine that she dosent really use.  Patient also has h/o C.Diff 3 weeks prior, had been having diarrhea since then but hasnt had a BM in several days now.  Patient reports R hip pain since a fall a few days back. This wasn't fractured, but she is essentially bed bound at this point and struggling to take care of herself due to extreme obesity recently." Procedures: Echocardiogram Consultants: None Antibiotics: Anti-infectives    Start     Dose/Rate Route Frequency Ordered Stop   12/04/15 2200  oseltamivir (TAMIFLU) capsule 75 mg     75 mg Oral 2 times daily 12/04/15 1723 12/09/15 2159   11/30/15 1500  metroNIDAZOLE (FLAGYL) tablet 500 mg  Status:  Discontinued     500 mg Oral 3 times per day 11/30/15 1346 12/02/15 1634  11/29/15 1530  cefTRIAXone (ROCEPHIN) 2 g in dextrose 5 % 50 mL IVPB  Status:  Discontinued     2 g 100 mL/hr over 30  Minutes Intravenous Every 24 hours 11/29/15 1424 12/02/15 1634   11/29/15 1315  doxycycline (VIBRAMYCIN) 100 mg in dextrose 5 % 250 mL IVPB  Status:  Discontinued     100 mg 125 mL/hr over 120 Minutes Intravenous Every 12 hours 11/29/15 1301 11/30/15 1346     Family Communication: family was present at bedside, at the time of interview.  Opportunity was given to ask question and all questions were answered satisfactorily.   Disposition:  Expected discharge date: 12/07/2015 Barriers to safe discharge: Improvement in constipation and oxygenation  No intake or output data in the 24 hours ending 12/05/15 1747 Filed Weights   11/28/15 0053 12/05/15 0005 12/05/15 0500  Weight: 138.347 kg (305 lb) 138.982 kg (306 lb 6.4 oz) 138.982 kg (306 lb 6.4 oz)    Objective: Physical Exam: Filed Vitals:   12/05/15 0559 12/05/15 0835 12/05/15 1345 12/05/15 1433  BP:   126/58   Pulse:   70   Temp: 98.1 F (36.7 C)  98.2 F (36.8 C)   TempSrc: Oral  Oral   Resp:   20   Height:      Weight:      SpO2:  91% 93% 93%    General: Appear in mild distress, no Rash; Oral Mucosa moist. Cardiovascular: S1 and S2 Present, no Murmur, no JVD Respiratory: Bilateral Air entry present and no Crackles, bilateral wheezes Abdomen: Bowel Sound present, Soft and no tenderness Extremities: bilateral Pedal edema, no calf tenderness  Data Reviewed: CBC:  Recent Labs Lab 12/03/15 0725 12/04/15 0700 12/05/15 0520  WBC 5.1 8.1 7.1  NEUTROABS 3.7  --  5.5  HGB 11.0* 10.6* 10.3*  HCT 32.0* 30.6* 31.5*  MCV 87.9 86.7 87.5  PLT 100* 131* [id number]   Basic Metabolic Panel:  Recent Labs Lab 11/29/15 2350 12/01/15 0629 12/03/15 0725 12/04/15 0700 12/05/15 0520  NA 138 137 139 138 137  K 5.2* 3.5 3.8 3.5 3.7  CL 105 100* 100* 102 100*  CO2 23 29 33* 29 31  GLUCOSE 195* 150* 172* 136* 146*  BUN 30* 27* 18 17 18   CREATININE 1.43* 1.26* 0.97 0.72 0.91  CALCIUM 8.2* 8.0* 8.0* 7.9* 7.9*  MG  --   --  1.7 1.6*  1.6*   Liver Function Tests:  Recent Labs Lab 12/01/15 1154 12/03/15 0725  AST 97* 91*  ALT 43 49  ALKPHOS 107 124  BILITOT 0.9 1.3*  PROT 5.3* 6.1*  ALBUMIN 1.6* 1.7*   No results for input(s): LIPASE, AMYLASE in the last 168 hours. No results for input(s): AMMONIA in the last 168 hours.  Cardiac Enzymes:  Recent Labs Lab 12/05/15 0118 12/05/15 0520 12/05/15 1148  TROPONINI 0.03 <0.03 0.03    BNP (last 3 results)  Recent Labs  11/28/15 0204  BNP 256.3*    CBG:  Recent Labs Lab 12/04/15 1709 12/04/15 2146 12/05/15 0624 12/05/15 1121 12/05/15 1623  GLUCAP 256* 171* 141* 172* 155*    Recent Results (from the past 240 hour(s))  Urine culture     Status: None   Collection Time: 11/28/15  4:45 AM  Result Value Ref Range Status   Specimen Description URINE, RANDOM  Final   Special Requests NONE  Final   Culture >=100,000 COLONIES/mL CITROBACTER FREUNDII  Final   Report Status 11/30/2015 FINAL  Final  Organism ID, Bacteria CITROBACTER FREUNDII  Final      Susceptibility   Citrobacter freundii - MIC*    CEFAZOLIN >=64 RESISTANT Resistant     CEFTRIAXONE <=1 SENSITIVE Sensitive     CIPROFLOXACIN <=0.25 SENSITIVE Sensitive     GENTAMICIN <=1 SENSITIVE Sensitive     IMIPENEM 0.5 SENSITIVE Sensitive     NITROFURANTOIN 128 RESISTANT Resistant     TRIMETH/SULFA <=20 SENSITIVE Sensitive     PIP/TAZO <=4 SENSITIVE Sensitive     * >=100,000 COLONIES/mL CITROBACTER FREUNDII     Studies: Dg Chest Port 1 View  12/05/2015  CLINICAL DATA:  Acute onset of generalized chest discomfort. Initial encounter. EXAM: PORTABLE CHEST 1 VIEW COMPARISON:  Chest radiograph performed 12/04/2015 FINDINGS: There is elevation of the right hemidiaphragm. Mild bibasilar opacities may reflect atelectasis or possibly mild pneumonia. Mild vascular crowding is noted. No pleural effusion or pneumothorax is seen. The cardiomediastinal silhouette is borderline normal in size. No acute osseous  abnormalities are identified. Scattered clips are seen overlying the left axilla. IMPRESSION: Elevation of the right hemidiaphragm. Mild bibasilar opacities may reflect atelectasis or possibly mild pneumonia. Electronically Signed   By: Garald Balding M.D.   On: 12/05/2015 03:20     Scheduled Meds: . acidophilus  1 capsule Oral Daily  . enoxaparin (LOVENOX) injection  70 mg Subcutaneous Q24H  . furosemide  40 mg Intravenous Q breakfast  . gabapentin  300 mg Oral BID  . hydrALAZINE  25 mg Oral 3 times per day  . insulin aspart  0-15 Units Subcutaneous TID WC  . insulin glargine  15 Units Subcutaneous Daily  . ipratropium-albuterol  3 mL Nebulization QID  . isosorbide mononitrate  30 mg Oral Daily  . levothyroxine  175 mcg Oral QAC breakfast  . magic mouthwash  5 mL Oral QID  . methylPREDNISolone (SOLU-MEDROL) injection  60 mg Intravenous Q6H  .  morphine injection  1 mg Intravenous Once  . oseltamivir  75 mg Oral BID  . oxybutynin  10 mg Oral BID  . PARoxetine  40 mg Oral Daily  . polyethylene glycol  17 g Oral BID  . senna-docusate  1 tablet Oral BID   Continuous Infusions:  PRN Meds: acetaminophen, albuterol, nitroGLYCERIN, tiZANidine  Time spent: 30 minutes  Author: Berle Mull, MD [facility] Pager: [phone] 12/05/2015 5:47 PM  If 7PM-7AM, please contact night-coverage at [email or web address], password [facility]

## 2015-12-05 NOTE — Progress Notes (Signed)
Occupational Therapy Treatment Patient Details Name: Gina Bender MRN: [id number] DOB: Nov 16, 1950 Today's Date: 12/05/2015    History of present illness 65 y.o. female admitted to [facility] on 11/28/15 for SOB.  Dx with asthma exacerbation.  Pt wtih significant PMHx of back pain, asthma, DM, HTN, fibromyalgia, CA, recurrent UTIs and urinary urgency, morbid obesity, bil knee arthroscopy, and back surgery.  Of note, pt reports recent fall to right side and buttocks with inability to move well at home (only able to go to the bathroom, was not eating) since the fall. Per MD note there is no fracture.     OT comments  Pt. Seen for introduction and education with HEP for B UE strengthening.  Hand out provided and pt. Able to return demo of indicated exercises.  Will continue to follow acutely.   Follow Up Recommendations  SNF    Equipment Recommendations       Recommendations for Other Services      Precautions / Restrictions Precautions Precautions: Fall Precaution Comments: hitory of recent fall       Mobility Bed Mobility Overal bed mobility: Needs Assistance             General bed mobility comments: max a to pull up in bed with use of headboard for extra support from B UES  Transfers                      Balance                                   ADL                                                Vision                     Perception     Praxis      Cognition   Behavior During Therapy: [facility] for tasks assessed/performed Overall Cognitive Status: Within Functional Limits for tasks assessed                       Extremity/Trunk Assessment               Exercises Other Exercises Other Exercises: provided level II orage theraband with handout.  reviewed instructions and technique for completion of exercises as part of HEP.  pt. able to return demo.  encouraged to complete multiple times a day    Shoulder Instructions       General Comments      Pertinent Vitals/ Pain       Pain Assessment: No/denies pain  Home Living                                          Prior Functioning/Environment              Frequency Min 2X/week     Progress Toward Goals  OT Goals(current goals can now be found in the care plan section)  Progress towards OT goals: Progressing toward goals     Plan Discharge plan remains appropriate    Co-evaluation  End of Session Equipment Utilized During Treatment: Other (comment);Oxygen (theraband)   Activity Tolerance Patient tolerated treatment well   Patient Left in bed;with call bell/phone within reach   Nurse Communication          Time: 0711-0727 OT Time Calculation (min): 16 min  Charges: OT General Charges $OT Visit: 1 Procedure OT Treatments $Therapeutic Exercise: 8-22 mins  Janice Coffin, COTA/L 12/05/2015, 7:31 AM

## 2015-12-05 NOTE — Progress Notes (Signed)
RN rounding on pt to assess pain level. Pt reported head pain and central chest pain 8/10. , denies radiation, nor SOB more than admission baseline. Pt unable to determine when chest pain started but has had this chest pain before. Unable to recollect what makes it better or worse. RN gave Tylenol 650. On pain reassessment, pt pain level unchanged. Pt in no physical distress, and VSS. However D/T hx of dysrhythmia and order for cardiac monitoring, RN paged night coverage Jonette Eva to notify of pt condition. RN implemented orders to perform EKG and admin 1mg  of morphine. Pt pain level unchanged after morphine. EKG unable to upload to [facility], RN paged MD and reported EKG results over phone. Rapid response called and at bedside spoke with Lynch about EKG results as well. RN received orders for Nitro up to 3x, morphine, CXR, and troponin. RN gave 1 nitro tab. Pt pain unchanged after 35mins but pt blood pressure 97/53. RN also did hard palpation to area of pain in chest and pt reported pain worsens with palpation.MD notified of these results and advised RN to not give additional nitro tabs nor morphine as BP too low to tolerate. No other orders given. RN continued to monitor pt. Troponin resulted negative. RN let pt rest. On pain reassessment, pain reporting 6/10 pain in same location. On further hourly rounding of pt, pt asleep but easily aroused. VSS, pt in no acute distress. Nursing will continue to monitor.

## 2015-12-05 NOTE — Progress Notes (Signed)
Call received per floor RN at 0100 regarding Pt with chest pain. Per Patient she has had chest pain all night. [facility] NP M.Lynch paged and orders received for EKG and Morphine 1 mg IVP. Currently complaining of chest pain 8/10 un relieved with 1 mg morphine. EKG completed. Advised RN to page [facility] again to update and obtain full set of VS. EKG reviewed at bedside minor ST depression noted in lead I and II. Updated [facility] on EKG. Troponin labs ordered, repeat morphine 1 mg IVP and  NTG SL ordered. Pt resting in bed in no distress, denies SOB, lung sound congested, rhonchi in bilateral upper lobes. Admits to having same pain at home, she does not "take anything for this and it usually just goes away after a while." Points to mid sternal chest, pain is reproducible with touch/pressure. Pt with very flat affect. Pt BP 93/57 after one NTG tab with pain unchanged. [facility] NP updated, suggested cxr and cardiac consult if troponin returns positive.  Advised RN to check VS again shortly and notify Provider of troponin results RRT to follow tonight, Pt left resting quietly with eyes closed.

## 2015-12-06 LAB — GLUCOSE, CAPILLARY
Glucose-Capillary: 159 mg/dL — ABNORMAL HIGH (ref 65–99)
Glucose-Capillary: 200 mg/dL — ABNORMAL HIGH (ref 65–99)
Glucose-Capillary: 211 mg/dL — ABNORMAL HIGH (ref 65–99)
Glucose-Capillary: 245 mg/dL — ABNORMAL HIGH (ref 65–99)

## 2015-12-06 MED ORDER — FUROSEMIDE 10 MG/ML IJ SOLN
40.0000 mg | Freq: Two times a day (BID) | INTRAMUSCULAR | Status: DC
  Administered 2015-12-07: 40 mg via INTRAVENOUS
  Filled 2015-12-06 (×2): qty 4

## 2015-12-06 MED ORDER — METHYLPREDNISOLONE SODIUM SUCC 125 MG IJ SOLR
40.0000 mg | Freq: Four times a day (QID) | INTRAMUSCULAR | Status: DC
  Administered 2015-12-06 – 2015-12-08 (×8): 40 mg via INTRAVENOUS
  Filled 2015-12-06 (×8): qty 2

## 2015-12-06 NOTE — Progress Notes (Signed)
[facility] Progress Note  Patient: Gina Bender [id number]   PCP: Nyoka Cowden, MD DOB: Sep 23, 1950   DOA: 11/28/2015   DOS: 12/06/2015   Date of Service: the patient was seen and examined on 12/06/2015  Subjective: Patient is feeling somewhat better. Shortness of breath and cough is also better. Patient mentions she has 3 loose watery bowel movement yesterday although the nursing staff reports only one solid formed bowel movement. Nutrition: Tolerating oral intake  Brief hospital course: Patient was admitted on 11/28/2015, with complaint of cough and shortness of breath, was found to have asthma exacerbation with acute hypoxic respiratory failure. On 12/04/2015 influenza PCR came back positive. Currently further plan is continue nebulizers and Solu-Medrol.  Assessment and Plan: 1. Asthma exacerbation  Influenza A Gradually improving I will reduce the Solu-Medrol to 40 mg every 6 hours. ABG shows mild hypoxia. Patient on Tamiflu. Continue duo nebs.   2. Obesity hypoventilation syndrome, obstructive sleep apnea. The patient is tolerating C Pap. Continue daily at bedtime.  3. UTI. Recent C. difficile. Patient has been started on IV ceftriaxone on admission  Completed 3 days of treatment with antibiotics. Patient was given Flagyl to prevent C. difficile infection which is now stopped.  4. Constipation. C. Difficile. At present resolved and while the patient reports diarrhea the staff documents solid formed bowel movement 1 only. Recommended staff to monitor and recommended patient to call the staff whenever she has a bowel movement Continue oral MiraLAX and Senokot.  5. Acute kidney injury. Hyperkalemia. Currently resolved. Likely from dehydration. Now back on Lasix  6. Acute on Chronic diastolic dysfunction. Sinus bradycardia Essential hypertension Echocardiogram shows grade 1 diastolic dysfunction with normal ejection fraction. No valvular  dysfunction. With increasing swelling most likely secondary from steroids I would switch the patient to IV Lasix. I will discontinue atenolol, due to bradycardia. On Imdur hydralazine combination. Due to recent AKI with hyperkalemia avoiding ACE inhibitor or ARB.  7. Type 2 diabetes mellitus. Hemoglobin A1c well controlled. Continue home regimen. Continue sliding scale in the setting of steroid-induced hyperglycemia  8. Hypothyroidism. Continue Synthroid.  9. Chronic pain syndrome. diabetic neuropathy. Mood disorder Continue gabapentin continue Paxil continue Zanaflex.  Activity: physical therapy SNF Bowel regimen: Last BM 12/05/2015 DVT Prophylaxis: subcutaneous Heparin Nutrition: Carb modified heart healthy diet Advance goals of care discussion: Full code  HPI: As per the H and P dictated on admission, "Gina Bender is a 65 y.o. female with pmh of asthma who is brought in by ambulance. She presents to the ED with c/o SOB onset 2 days PTA that recently worsened today. Associated cough and wheezing. No fever, chills. Inhalor provides no relief. No h/o COPD, does have h/o OSA with a CPAP machine that she dosent really use.  Patient also has h/o C.Diff 3 weeks prior, had been having diarrhea since then but hasnt had a BM in several days now.  Patient reports R hip pain since a fall a few days back. This wasn't fractured, but she is essentially bed bound at this point and struggling to take care of herself due to extreme obesity recently." Procedures: Echocardiogram Consultants: None Antibiotics: Anti-infectives    Start     Dose/Rate Route Frequency Ordered Stop   12/04/15 2200  oseltamivir (TAMIFLU) capsule 75 mg     75 mg Oral 2 times daily 12/04/15 1723 12/09/15 2159   11/30/15 1500  metroNIDAZOLE (FLAGYL) tablet 500 mg  Status:  Discontinued     500 mg Oral 3  times per day 11/30/15 1346 12/02/15 1634   11/29/15 1530  cefTRIAXone (ROCEPHIN) 2 g in dextrose 5 % 50 mL  IVPB  Status:  Discontinued     2 g 100 mL/hr over 30 Minutes Intravenous Every 24 hours 11/29/15 1424 12/02/15 1634   11/29/15 1315  doxycycline (VIBRAMYCIN) 100 mg in dextrose 5 % 250 mL IVPB  Status:  Discontinued     100 mg 125 mL/hr over 120 Minutes Intravenous Every 12 hours 11/29/15 1301 11/30/15 1346     Family Communication: family was present at bedside, at the time of interview.  Opportunity was given to ask question and all questions were answered satisfactorily.   Disposition:  Expected discharge date: 12/08/2015 Barriers to safe discharge: Improvement in constipation and oxygenation   Intake/Output Summary (Last 24 hours) at 12/06/15 0936 Last data filed at 12/05/15 1821  Gross per 24 hour  Intake    480 ml  Output      0 ml  Net    480 ml   Filed Weights   11/28/15 0053 12/05/15 0005 12/05/15 0500  Weight: 138.347 kg (305 lb) 138.982 kg (306 lb 6.4 oz) 138.982 kg (306 lb 6.4 oz)    Objective: Physical Exam: Filed Vitals:   12/05/15 2039 12/05/15 2041 12/06/15 0656 12/06/15 0754  BP:  119/55 140/78   Pulse:  87 86   Temp:  98.1 F (36.7 C) 98.2 F (36.8 C)   TempSrc:  Oral    Resp:  20 20   Height:      Weight:      SpO2: 93% 94% 94% 93%    General: Appear in mild distress, no Rash; Oral Mucosa Shows thrush. Cardiovascular: S1 and S2 Present, no Murmur, Difficult to assess JVD Respiratory: Bilateral Air entry present and no Crackles, Improving bilateral wheezes Abdomen: Bowel Sound present, Soft and no tenderness Extremities: bilateral Pedal edema, no calf tenderness  Data Reviewed: CBC:  Recent Labs Lab 12/03/15 0725 12/04/15 0700 12/05/15 0520  WBC 5.1 8.1 7.1  NEUTROABS 3.7  --  5.5  HGB 11.0* 10.6* 10.3*  HCT 32.0* 30.6* 31.5*  MCV 87.9 86.7 87.5  PLT 100* 131* [id number]   Basic Metabolic Panel:  Recent Labs Lab 11/29/15 2350 12/01/15 0629 12/03/15 0725 12/04/15 0700 12/05/15 0520  NA 138 137 139 138 137  K 5.2* 3.5 3.8 3.5 3.7   CL 105 100* 100* 102 100*  CO2 23 29 33* 29 31  GLUCOSE 195* 150* 172* 136* 146*  BUN 30* 27* 18 17 18   CREATININE 1.43* 1.26* 0.97 0.72 0.91  CALCIUM 8.2* 8.0* 8.0* 7.9* 7.9*  MG  --   --  1.7 1.6* 1.6*   Liver Function Tests:  Recent Labs Lab 12/01/15 1154 12/03/15 0725  AST 97* 91*  ALT 43 49  ALKPHOS 107 124  BILITOT 0.9 1.3*  PROT 5.3* 6.1*  ALBUMIN 1.6* 1.7*   No results for input(s): LIPASE, AMYLASE in the last 168 hours. No results for input(s): AMMONIA in the last 168 hours.  Cardiac Enzymes:  Recent Labs Lab 12/05/15 0118 12/05/15 0520 12/05/15 1148  TROPONINI 0.03 <0.03 0.03    BNP (last 3 results)  Recent Labs  11/28/15 0204  BNP 256.3*    CBG:  Recent Labs Lab 12/05/15 0624 12/05/15 1121 12/05/15 1623 12/05/15 2234 12/06/15 0721  GLUCAP 141* 172* 155* 176* 159*    Recent Results (from the past 240 hour(s))  Urine culture     Status: None  Collection Time: 11/28/15  4:45 AM  Result Value Ref Range Status   Specimen Description URINE, RANDOM  Final   Special Requests NONE  Final   Culture >=100,000 COLONIES/mL CITROBACTER FREUNDII  Final   Report Status 11/30/2015 FINAL  Final   Organism ID, Bacteria CITROBACTER FREUNDII  Final      Susceptibility   Citrobacter freundii - MIC*    CEFAZOLIN >=64 RESISTANT Resistant     CEFTRIAXONE <=1 SENSITIVE Sensitive     CIPROFLOXACIN <=0.25 SENSITIVE Sensitive     GENTAMICIN <=1 SENSITIVE Sensitive     IMIPENEM 0.5 SENSITIVE Sensitive     NITROFURANTOIN 128 RESISTANT Resistant     TRIMETH/SULFA <=20 SENSITIVE Sensitive     PIP/TAZO <=4 SENSITIVE Sensitive     * >=100,000 COLONIES/mL CITROBACTER FREUNDII     Studies: No results found.   Scheduled Meds: . acidophilus  1 capsule Oral Daily  . enoxaparin (LOVENOX) injection  70 mg Subcutaneous Q24H  . furosemide  40 mg Intravenous Q12H  . gabapentin  300 mg Oral BID  . hydrALAZINE  25 mg Oral 3 times per day  . insulin aspart  0-15  Units Subcutaneous TID WC  . insulin glargine  15 Units Subcutaneous Daily  . ipratropium-albuterol  3 mL Nebulization QID  . isosorbide mononitrate  30 mg Oral Daily  . levothyroxine  175 mcg Oral QAC breakfast  . magic mouthwash  5 mL Oral QID  . methylPREDNISolone (SOLU-MEDROL) injection  60 mg Intravenous Q6H  .  morphine injection  1 mg Intravenous Once  . oseltamivir  75 mg Oral BID  . oxybutynin  10 mg Oral BID  . PARoxetine  40 mg Oral Daily  . polyethylene glycol  17 g Oral BID  . senna-docusate  1 tablet Oral BID   Continuous Infusions:  PRN Meds: acetaminophen, albuterol, nitroGLYCERIN, tiZANidine  Time spent: 30 minutes  Author: Berle Mull, MD [facility] Pager: [phone] 12/06/2015 9:36 AM  If 7PM-7AM, please contact night-coverage at [email or web address], password [facility]

## 2015-12-07 DIAGNOSIS — R0602 Shortness of breath: Secondary | ICD-10-CM

## 2015-12-07 DIAGNOSIS — J101 Influenza due to other identified influenza virus with other respiratory manifestations: Principal | ICD-10-CM

## 2015-12-07 DIAGNOSIS — J45901 Unspecified asthma with (acute) exacerbation: Secondary | ICD-10-CM

## 2015-12-07 DIAGNOSIS — R0902 Hypoxemia: Secondary | ICD-10-CM

## 2015-12-07 DIAGNOSIS — J383 Other diseases of vocal cords: Secondary | ICD-10-CM

## 2015-12-07 LAB — CBC
HEMATOCRIT: 29.7 % — AB (ref 36.0–46.0)
Hemoglobin: 9.9 g/dL — ABNORMAL LOW (ref 12.0–15.0)
MCH: 29 pg (ref 26.0–34.0)
MCHC: 33.3 g/dL (ref 30.0–36.0)
MCV: 87.1 fL (ref 78.0–100.0)
PLATELETS: 139 10*3/uL — AB (ref 150–400)
RBC: 3.41 MIL/uL — ABNORMAL LOW (ref 3.87–5.11)
RDW: 22.3 % — AB (ref 11.5–15.5)
WBC: 8.1 10*3/uL (ref 4.0–10.5)

## 2015-12-07 LAB — BASIC METABOLIC PANEL
Anion gap: 11 (ref 5–15)
BUN: 35 mg/dL — ABNORMAL HIGH (ref 6–20)
CALCIUM: 8 mg/dL — AB (ref 8.9–10.3)
CO2: 26 mmol/L (ref 22–32)
CREATININE: 1.09 mg/dL — AB (ref 0.44–1.00)
Chloride: 99 mmol/L — ABNORMAL LOW (ref 101–111)
GFR calc non Af Amer: 52 mL/min — ABNORMAL LOW (ref >60)
GFR, EST AFRICAN AMERICAN: 60 mL/min — AB (ref >60)
Glucose, Bld: 198 mg/dL — ABNORMAL HIGH (ref 65–99)
Potassium: 4.3 mmol/L (ref 3.5–5.1)
Sodium: 136 mmol/L (ref 135–145)

## 2015-12-07 LAB — GLUCOSE, CAPILLARY
GLUCOSE-CAPILLARY: 199 mg/dL — AB (ref 65–99)
GLUCOSE-CAPILLARY: 235 mg/dL — AB (ref 65–99)
Glucose-Capillary: 289 mg/dL — ABNORMAL HIGH (ref 65–99)
Glucose-Capillary: 295 mg/dL — ABNORMAL HIGH (ref 65–99)

## 2015-12-07 LAB — MAGNESIUM: Magnesium: 2 mg/dL (ref 1.7–2.4)

## 2015-12-07 MED ORDER — FUROSEMIDE 10 MG/ML IJ SOLN
80.0000 mg | Freq: Two times a day (BID) | INTRAMUSCULAR | Status: DC
  Administered 2015-12-07 – 2015-12-09 (×4): 80 mg via INTRAVENOUS
  Filled 2015-12-07 (×4): qty 8

## 2015-12-07 MED ORDER — PANTOPRAZOLE SODIUM 40 MG PO TBEC
40.0000 mg | DELAYED_RELEASE_TABLET | Freq: Two times a day (BID) | ORAL | Status: DC
  Administered 2015-12-07 – 2015-12-15 (×16): 40 mg via ORAL
  Filled 2015-12-07 (×15): qty 1

## 2015-12-07 MED ORDER — FLUTICASONE PROPIONATE 50 MCG/ACT NA SUSP
2.0000 | Freq: Every day | NASAL | Status: DC
  Administered 2015-12-07 – 2015-12-15 (×9): 2 via NASAL
  Filled 2015-12-07: qty 16

## 2015-12-07 MED ORDER — FUROSEMIDE 10 MG/ML IJ SOLN
40.0000 mg | Freq: Once | INTRAMUSCULAR | Status: AC
  Administered 2015-12-07: 40 mg via INTRAVENOUS

## 2015-12-07 NOTE — Progress Notes (Signed)
Occupational Therapy Treatment Patient Details Name: Gina Bender MRN: [id number] DOB: 02/23/1951 Today's Date: 12/07/2015    History of present illness 64 y.o. female admitted to [facility] on 11/28/15 for SOB.  Dx with asthma exacerbation.  Pt wtih significant PMHx of back pain, asthma, DM, HTN, fibromyalgia, CA, recurrent UTIs and urinary urgency, morbid obesity, bil knee arthroscopy, and back surgery.  Of note, pt reports recent fall to right side and buttocks with inability to move well at home (only able to go to the bathroom, was not eating) since the fall. Per MD note there is no fracture.     OT comments  Pt making steady progress. Completing LB ADL with mod A and mobility with min A @ RW level. Dypsnea 2/4 during functional tasks. Complaining of "not feeling well". Will continue to follow acutely to address established goals and facilitate D/C to SNF for rehab.   Follow Up Recommendations  SNF;Supervision/Assistance - 24 hour    Equipment Recommendations    TBA at SNF   Recommendations for Other Services      Precautions / Restrictions Precautions Precautions: Fall Precaution Comments: history of recent fall       Mobility Bed Mobility Overal bed mobility: Needs Assistance Bed Mobility: Sit to Supine       Sit to supine: Mod assist      Transfers Overall transfer level: Needs assistance Equipment used: Rolling walker (2 wheeled) Transfers: Sit to/from Stand Sit to Stand: Min guard              Balance             Standing balance-Leahy Scale: Fair                     ADL Overall ADL's : Needs assistance/impaired     Grooming: Set up;Sitting   Upper Body Bathing: Set up;Sitting   Lower Body Bathing: Moderate assistance;Sit to/from stand           Toilet Transfer: Min guard;BSC;RW   Toileting- Water quality scientist and Hygiene: Moderate assistance Toileting - Clothing Manipulation Details (indicate cue type and reason): to  thouroughly clean. Pt unable to reach at this time     Functional mobility during ADLs: Min guard;Rolling walker        Vision                     Perception     Praxis      Cognition   Behavior During Therapy: [facility] for tasks assessed/performed Overall Cognitive Status: Within Functional Limits for tasks assessed                       Extremity/Trunk Assessment     BUE generalzied weakness          Exercises     Shoulder Instructions       General Comments      Pertinent Vitals/ Pain       Pain Assessment: 0-10 Pain Score: 2  Pain Location: back Pain Descriptors / Indicators: Sore Pain Intervention(s): Limited activity within patient's tolerance  Home Living                                          Prior Functioning/Environment              Frequency Min 2X/week  Progress Toward Goals  OT Goals(current goals can now be found in the care plan section)  Progress towards OT goals: Progressing toward goals  Acute Rehab OT Goals Patient Stated Goal: to be able to take care of my grandchildren OT Goal Formulation: With patient Time For Goal Achievement: 12/13/15 Potential to Achieve Goals: Good ADL Goals Pt Will Perform Grooming: with modified independence;sitting Pt Will Perform Upper Body Bathing: with modified independence;sitting Pt Will Perform Lower Body Bathing: with min assist;sit to/from stand Pt Will Transfer to Toilet: with min assist;ambulating;bedside commode Pt Will Perform Toileting - Clothing Manipulation and hygiene: sit to/from stand;with min guard assist Pt/caregiver will Perform Home Exercise Program: Increased strength;Both right and left upper extremity;With theraband;Independently;With written HEP provided  Plan Discharge plan remains appropriate    Co-evaluation                 End of Session Equipment Utilized During Treatment: Gait belt;Rolling walker;Oxygen (2L)   Activity  Tolerance Patient tolerated treatment well   Patient Left in bed;with call bell/phone within reach   Nurse Communication Mobility status        Time: [id number] OT Time Calculation (min): 36 min  Charges: OT General Charges $OT Visit: 1 Procedure OT Treatments $Self Care/Home Management : 23-37 mins  Aleene Swanner,HILLARY 12/07/2015, 4:55 PM   [facility], OTR/L  [phone] 12/07/2015

## 2015-12-07 NOTE — Progress Notes (Signed)
[facility] Progress Note  Patient: Gina Bender [id number]   PCP: Nyoka Cowden, MD DOB: 09/18/1950   DOA: 11/28/2015   DOS: 12/07/2015   Date of Service: the patient was seen and examined on 12/07/2015  Subjective: She remains lethargic, short of breath, continues to have cough. No bowel movement reported. Nutrition: Tolerating oral intake  Brief hospital course: Patient was admitted on 11/28/2015, with complaint of cough and shortness of breath, was found to have asthma exacerbation with acute hypoxic respiratory failure. On 12/04/2015 influenza PCR came back positive. Currently further plan is continue nebulizers and Solu-Medrol.  Assessment and Plan: 1. Asthma exacerbation  Influenza A Gradually improving I will reduce the Solu-Medrol to 40 mg every 6 hours. ABG shows mild hypoxia. Patient on Tamiflu. Continue duo nebs.  Pulmonary consulted to assist in management. Appreciate input and no change in management at present  2. Obesity hypoventilation syndrome, obstructive sleep apnea. The patient is tolerating C Pap. Continue daily at bedtime.  3. UTI. Recent C. difficile. Patient has been started on IV ceftriaxone on admission  Completed 3 days of treatment with antibiotics. Patient was given Flagyl to prevent C. difficile infection which is now stopped.  4. Constipation. C. Difficile. Report from the patient and the staff are different on bowel movements Continue oral MiraLAX and Senokot.  5. Acute kidney injury. Hyperkalemia. Currently resolved. Likely from dehydration. Now back on Lasix  6. Acute on Chronic diastolic dysfunction. Sinus bradycardia Essential hypertension Echocardiogram shows grade 1 diastolic dysfunction with normal ejection fraction. No valvular dysfunction. Increase IV Lasix, Foley catheter for monitoring of urine output. Recommend nursing to check daily weight. discontinue atenolol, due to bradycardia. On Imdur hydralazine  combination. Due to recent AKI with hyperkalemia avoiding ACE inhibitor or ARB.  7. Type 2 diabetes mellitus. Hemoglobin A1c well controlled. Continue home regimen. Continue sliding scale in the setting of steroid-induced hyperglycemia  8. Hypothyroidism. Continue Synthroid.  9. Chronic pain syndrome. diabetic neuropathy. Mood disorder Continue gabapentin continue Paxil continue Zanaflex.  Activity: physical therapy SNF Bowel regimen: Last BM 12/05/2015 DVT Prophylaxis: subcutaneous Heparin Nutrition: Carb modified heart healthy diet Advance goals of care discussion: Full code  HPI: As per the H and P dictated on admission, "Gina Bender is a 65 y.o. female with pmh of asthma who is brought in by ambulance. She presents to the ED with c/o SOB onset 2 days PTA that recently worsened today. Associated cough and wheezing. No fever, chills. Inhalor provides no relief. No h/o COPD, does have h/o OSA with a CPAP machine that she dosent really use.  Patient also has h/o C.Diff 3 weeks prior, had been having diarrhea since then but hasnt had a BM in several days now.  Patient reports R hip pain since a fall a few days back. This wasn't fractured, but she is essentially bed bound at this point and struggling to take care of herself due to extreme obesity recently." Procedures: Echocardiogram Consultants: None Antibiotics: Anti-infectives    Start     Dose/Rate Route Frequency Ordered Stop   12/04/15 2200  oseltamivir (TAMIFLU) capsule 75 mg     75 mg Oral 2 times daily 12/04/15 1723 12/09/15 2159   11/30/15 1500  metroNIDAZOLE (FLAGYL) tablet 500 mg  Status:  Discontinued     500 mg Oral 3 times per day 11/30/15 1346 12/02/15 1634   11/29/15 1530  cefTRIAXone (ROCEPHIN) 2 g in dextrose 5 % 50 mL IVPB  Status:  Discontinued  2 g 100 mL/hr over 30 Minutes Intravenous Every 24 hours 11/29/15 1424 12/02/15 1634   11/29/15 1315  doxycycline (VIBRAMYCIN) 100 mg in dextrose 5 %  250 mL IVPB  Status:  Discontinued     100 mg 125 mL/hr over 120 Minutes Intravenous Every 12 hours 11/29/15 1301 11/30/15 1346     Family Communication: family was present at bedside, at the time of interview.  Opportunity was given to ask question and all questions were answered satisfactorily.   Disposition:  Expected discharge date: 12/08/2015 Barriers to safe discharge: Improvement in constipation and oxygenation   Intake/Output Summary (Last 24 hours) at 12/07/15 1938 Last data filed at 12/07/15 1900  Gross per 24 hour  Intake    840 ml  Output   1400 ml  Net   -560 ml   Filed Weights   12/05/15 0005 12/05/15 0500 12/07/15 1000  Weight: 138.982 kg (306 lb 6.4 oz) 138.982 kg (306 lb 6.4 oz) 139.027 kg (306 lb 8 oz)    Objective: Physical Exam: Filed Vitals:   12/06/15 2131 12/07/15 0441 12/07/15 1000 12/07/15 1424  BP: 142/60 122/57  112/51  Pulse: 91 75  72  Temp: 98.3 F (36.8 C) 97.5 F (36.4 C)  98.8 F (37.1 C)  TempSrc:  Oral  Oral  Resp: 19 18  17   Height:      Weight:   139.027 kg (306 lb 8 oz)   SpO2: 94% 94%  94%    General: Appear in mild distress, no Rash; Oral Mucosa Shows thrush. Cardiovascular: S1 and S2 Present, no Murmur, Difficult to assess JVD Respiratory: Bilateral Air entry present and no Crackles, Improving bilateral wheezes Abdomen: Bowel Sound present, Soft and no tenderness Extremities: bilateral Pedal edema, no calf tenderness  Data Reviewed: CBC:  Recent Labs Lab 12/03/15 0725 12/04/15 0700 12/05/15 0520 12/07/15 0425  WBC 5.1 8.1 7.1 8.1  NEUTROABS 3.7  --  5.5  --   HGB 11.0* 10.6* 10.3* 9.9*  HCT 32.0* 30.6* 31.5* 29.7*  MCV 87.9 86.7 87.5 87.1  PLT 100* 131* 159 [id number]*   Basic Metabolic Panel:  Recent Labs Lab 12/01/15 0629 12/03/15 0725 12/04/15 0700 12/05/15 0520 12/07/15 0425  NA 137 139 138 137 136  K 3.5 3.8 3.5 3.7 4.3  CL 100* 100* 102 100* 99*  CO2 29 33* 29 31 26   GLUCOSE 150* 172* 136* 146* 198*    BUN 27* 18 17 18  35*  CREATININE 1.26* 0.97 0.72 0.91 1.09*  CALCIUM 8.0* 8.0* 7.9* 7.9* 8.0*  MG  --  1.7 1.6* 1.6* 2.0   Liver Function Tests:  Recent Labs Lab 12/01/15 1154 12/03/15 0725  AST 97* 91*  ALT 43 49  ALKPHOS 107 124  BILITOT 0.9 1.3*  PROT 5.3* 6.1*  ALBUMIN 1.6* 1.7*   No results for input(s): LIPASE, AMYLASE in the last 168 hours. No results for input(s): AMMONIA in the last 168 hours.  Cardiac Enzymes:  Recent Labs Lab 12/05/15 0118 12/05/15 0520 12/05/15 1148  TROPONINI 0.03 <0.03 0.03    BNP (last 3 results)  Recent Labs  11/28/15 0204  BNP 256.3*    CBG:  Recent Labs Lab 12/06/15 1628 12/06/15 2130 12/07/15 0630 12/07/15 1132 12/07/15 1658  GLUCAP 211* 245* 199* 235* 289*    Recent Results (from the past 240 hour(s))  Urine culture     Status: None   Collection Time: 11/28/15  4:45 AM  Result Value Ref Range Status  Specimen Description URINE, RANDOM  Final   Special Requests NONE  Final   Culture >=100,000 COLONIES/mL CITROBACTER FREUNDII  Final   Report Status 11/30/2015 FINAL  Final   Organism ID, Bacteria CITROBACTER FREUNDII  Final      Susceptibility   Citrobacter freundii - MIC*    CEFAZOLIN >=64 RESISTANT Resistant     CEFTRIAXONE <=1 SENSITIVE Sensitive     CIPROFLOXACIN <=0.25 SENSITIVE Sensitive     GENTAMICIN <=1 SENSITIVE Sensitive     IMIPENEM 0.5 SENSITIVE Sensitive     NITROFURANTOIN 128 RESISTANT Resistant     TRIMETH/SULFA <=20 SENSITIVE Sensitive     PIP/TAZO <=4 SENSITIVE Sensitive     * >=100,000 COLONIES/mL CITROBACTER FREUNDII     Studies: No results found.   Scheduled Meds: . acidophilus  1 capsule Oral Daily  . enoxaparin (LOVENOX) injection  70 mg Subcutaneous Q24H  . fluticasone  2 spray Each Nare Daily  . furosemide  80 mg Intravenous Q12H  . gabapentin  300 mg Oral BID  . hydrALAZINE  25 mg Oral 3 times per day  . insulin aspart  0-15 Units Subcutaneous TID WC  . insulin glargine   15 Units Subcutaneous Daily  . ipratropium-albuterol  3 mL Nebulization QID  . isosorbide mononitrate  30 mg Oral Daily  . levothyroxine  175 mcg Oral QAC breakfast  . magic mouthwash  5 mL Oral QID  . methylPREDNISolone (SOLU-MEDROL) injection  40 mg Intravenous Q6H  .  morphine injection  1 mg Intravenous Once  . oseltamivir  75 mg Oral BID  . oxybutynin  10 mg Oral BID  . pantoprazole  40 mg Oral BID  . PARoxetine  40 mg Oral Daily  . polyethylene glycol  17 g Oral BID  . senna-docusate  1 tablet Oral BID   Continuous Infusions:  PRN Meds: acetaminophen, albuterol, nitroGLYCERIN, tiZANidine  Time spent: 30 minutes  Author: Berle Mull, MD [facility] Pager: [phone] 12/07/2015 7:38 PM  If 7PM-7AM, please contact night-coverage at [email or web address], password [facility]

## 2015-12-07 NOTE — Consult Note (Signed)
Name: Gina Bender MRN: [id number] DOB: 06/06/1951    ADMISSION DATE:  11/28/2015 CONSULTATION DATE:  12/07/15  REFERRING MD :  Posey Pronto  CHIEF COMPLAINT:  SOB   HISTORY OF PRESENT ILLNESS:  Gina Bender is a 65 y.o. female with a PMH as outlined below.  She was admitted 12/07/15 with asthma exacerbation.  She was started on steroids, BD's, abx.  During her admission, she was tested for flu for which she was positive (influenza A by flu PCR).  She had gradual improvement but as of 12/07/15, continued to have some SOB, mainly with exertion, along with productive cough.  PCCM was called for further recs.  She denies any fevers/chills/sweats, chest pain, N/V/D, abd pain, myalgias.  She has OSA / OHS for which she is on nocturnal CPAP.  She states she has one at home but does not use it as prescribed because she does not like the way that it fits.  She has been using the one here in the hospital and likes it much better.  She has asked if we can change her mask at home so that she can hopefully be compliant each night.  PAST MEDICAL HISTORY :   has a past medical history of Back pain; Arthritis; Asthma; Diabetes mellitus without complication ([place]); Hypertension; Fibromyalgia; Thyroid disease; Cancer ([place]); Splenic laceration; Sleep apnea; Pneumonia; Bronchitis; History of recurrent UTIs; History of urinary urgency; Complication of anesthesia; PONV (postoperative nausea and vomiting); and Difficult intubation.  has past surgical history that includes Breast surgery; Appendectomy; Cholecystectomy; Abdominal surgery; Vaginal hysterectomy; Tonsillectomy; Knee arthroscopy; Fracture surgery (1973); Eye surgery (Right); Back surgery (2003 or 2004); and Kyphoplasty (Bilateral, 10/10/2014). Prior to Admission medications   Medication Sig Start Date End Date Taking? Authorizing Provider  acetaminophen (TYLENOL) 500 MG tablet Take 500 mg by mouth every 6 (six) hours as needed for mild pain.   Yes  Historical Provider, MD  aspirin EC 81 MG tablet Take 81 mg by mouth daily.   Yes Historical Provider, MD  atenolol (TENORMIN) 50 MG tablet Take 1 tablet (50 mg total) by mouth daily. 08/21/15  Yes Marletta Lor, MD  bacitracin ophthalmic ointment Place 1 application into the left eye at bedtime. Reported on 11/16/2015 02/17/15  Yes Historical Provider, MD  betamethasone valerate ointment (VALISONE) 0.1 % Apply a pea sized amount topically BID for up to 2 weeks 09/01/15  Yes Salvadore Dom, MD  calcium-vitamin D (OSCAL WITH D) 500-200 MG-UNIT per tablet Take 2 tablets by mouth daily with breakfast.   Yes Historical Provider, MD  cloNIDine (CATAPRES) 0.1 MG tablet Take 1 tablet (0.1 mg total) by mouth 2 (two) times daily. 08/21/15  Yes Marletta Lor, MD  furosemide (LASIX) 40 MG tablet Take 1 tablet (40 mg total) by mouth daily. 08/21/15  Yes Marletta Lor, MD  gabapentin (NEURONTIN) 300 MG capsule Take 1 capsule (300 mg total) by mouth 2 (two) times daily. 08/21/15  Yes Marletta Lor, MD  glucose blood (FREESTYLE LITE) test strip 100 each by Does not apply route.   Yes Historical Provider, MD  glucose blood (FREESTYLE LITE) test strip by Does not apply route.   Yes Historical Provider, MD  ibuprofen (ADVIL,MOTRIN) 200 MG tablet Take 200 mg by mouth every 8 (eight) hours as needed for mild pain.   Yes Historical Provider, MD  Insulin Lispro Prot & Lispro (HUMALOG MIX 75/25 KWIKPEN) (75-25) 100 UNIT/ML Kwikpen Inject 26 Units into the skin 2 (two) times daily.  08/21/15  Yes Marletta Lor, MD  Insulin Pen Needle (PRODIGY MINI PEN NEEDLES) 31G X 5 MM MISC USE 2 TIMES A DAY 11/05/13  Yes Historical Provider, MD  levothyroxine (SYNTHROID, LEVOTHROID) 175 MCG tablet Take 1 tablet (175 mcg total) by mouth daily. 08/21/15  Yes Marletta Lor, MD  losartan (COZAAR) 100 MG tablet Take 1 tablet (100 mg total) by mouth daily. 08/21/15  Yes Marletta Lor, MD  metFORMIN  (GLUCOPHAGE) 1000 MG tablet Take 1 tablet (1,000 mg total) by mouth 2 (two) times daily. 08/21/15  Yes Marletta Lor, MD  metroNIDAZOLE (FLAGYL) 500 MG tablet Take 1 tablet (500 mg total) by mouth 3 (three) times daily. 11/16/15  Yes Laurey Morale, MD  Multiple Vitamins-Minerals (MULTIVITAMIN WITH MINERALS) tablet Take 1 tablet by mouth daily.   Yes Historical Provider, MD  Omega-3 Fatty Acids (OMEGA 3 PO) Take 1 tablet by mouth daily.   Yes Historical Provider, MD  oxybutynin (DITROPAN) 5 MG tablet Take 2 tablets (10 mg total) by mouth 2 (two) times daily. 08/24/15  Yes Marletta Lor, MD  PARoxetine (PAXIL) 40 MG tablet Take 1 tablet (40 mg total) by mouth daily. 08/21/15  Yes Marletta Lor, MD  tiZANidine (ZANAFLEX) 2 MG tablet Take 1 tablet (2 mg total) by mouth every 8 (eight) hours as needed for muscle spasms. 01/22/15  Yes Marletta Lor, MD  VENTOLIN HFA 108 [phone] Base) MCG/ACT inhaler Inhale 2 puffs into the lungs every 6 (six) hours as needed. Shortness of breath 11/24/15  Yes Marletta Lor, MD   Allergies  Allergen Reactions  . Hydrocodone-Acetaminophen     REACTION: nausea and vomiting  . Latex Dermatitis  . Oxycodone-Acetaminophen     REACTION: hives, nausea and vomiting  . Pollen Extract   . Succinylcholine     REACTION: heart stopped    FAMILY HISTORY:  family history includes Breast cancer in her sister; Cancer in her father; Hypertension in her mother; Lung cancer in her father; Stroke in her mother. SOCIAL HISTORY:  reports that she has never smoked. She has never used smokeless tobacco. She reports that she does not drink alcohol or use illicit drugs.  REVIEW OF SYSTEMS:   All negative; except for those that are bolded, which indicate positives.  Constitutional: weight loss, weight gain, night sweats, fevers, chills, fatigue, weakness.  HEENT: headaches, sore throat, sneezing, nasal congestion, post nasal drip, difficulty swallowing,  tooth/dental problems, visual complaints, visual changes, ear aches. Neuro: difficulty with speech, weakness, numbness, ataxia. CV:  chest pain, orthopnea, PND, swelling in lower extremities, dizziness, palpitations, syncope.  Resp: cough, hemoptysis, dyspnea, wheezing. GI  heartburn, indigestion, abdominal pain, nausea, vomiting, diarrhea, constipation, change in bowel habits, loss of appetite, hematemesis, melena, hematochezia.  GU: dysuria, change in color of urine, urgency or frequency, flank pain, hematuria. MSK: joint pain or swelling, decreased range of motion. Psych: change in mood or affect, depression, anxiety, suicidal ideations, homicidal ideations. Skin: rash, itching, bruising.    SUBJECTIVE:  Feels that SOB is somewhat improved particularly at rest.  VITAL SIGNS: Temp:  [97.5 F (36.4 C)-98.8 F (37.1 C)] 98.8 F (37.1 C) (04/03 1424) Pulse Rate:  [72-91] 72 (04/03 1424) Resp:  [17-19] 17 (04/03 1424) BP: (112-142)/(51-60) 112/51 mmHg (04/03 1424) SpO2:  [94 %] 94 % (04/03 1424) Weight:  [139.027 kg (306 lb 8 oz)] 139.027 kg (306 lb 8 oz) (04/03 1000)  PHYSICAL EXAMINATION: General: Adult female, obese, sitting in recliner, in  NAD. Neuro: A&O x 3, non-focal.  HEENT: [place]/AT. PERRL, sclerae anicteric. Cardiovascular: RRR, no M/R/G.  Lungs: Respirations even and unlabored.  Faint wheeze, mainly upper airway. Abdomen: Obese, BS x 4, soft, NT/ND.  Musculoskeletal: No gross deformities, 1+ edema.  Skin: Intact, warm, no rashes.     Recent Labs Lab 12/04/15 0700 12/05/15 0520 12/07/15 0425  NA 138 137 136  K 3.5 3.7 4.3  CL 102 100* 99*  CO2 29 31 26   BUN 17 18 35*  CREATININE 0.72 0.91 1.09*  GLUCOSE 136* 146* 198*    Recent Labs Lab 12/04/15 0700 12/05/15 0520 12/07/15 0425  HGB 10.6* 10.3* 9.9*  HCT 30.6* 31.5* 29.7*  WBC 8.1 7.1 8.1  PLT 131* 159 139*   No results found.  STUDIES:  CXR 04/01 > atx.  SIGNIFICANT EVENTS  03/25 >  admit 04/03 > PCCM consult.  ASSESSMENT / PLAN:  Acute hypoxic respiratory failure - due to influenza A + asthma exacerbation. Asthma exacerbation. OSA / OHS - on nocturnal CPAP but noncompliant as outpatient.  Asking if she can have a different mask. Cough. Atelectasis. ? VCD.  Plan: Continue supplemental O2 as needed to maintain SpO2 > 92%. Recommend ambulatory desat study prior to discharge to assess for home O2 needs (ordered). Continue tamiflu, BD's, steroids (wean). Continue nocturnal CPAP. Will ask case management to assist in obtaining different mask. Would have pt follow up with pulmonary as outpatient and obtain PFT's at that time. Add PPI, flonase, flutter valve. Mobilize as able.   Rest per primary team.   Montey Hora, [facility] Pager: [phone]  or [phone] 12/07/2015, 4:47 PM  Attending Note:  65 year old female with history of asthma who presents to the hospital on 3/25 with SOB. Patient was diagnosed with an asthma exacerbation and treated with abx and steroids with evidently little improvement. Patient reports she feels better. On exam, wheezing is mostly from the upper airway and patient reports nasal drainage. I reviewed CXR myself, some evidence of pulmonary edema. Echo with grade one diastolic dysfunction. Discussed with PCCM-NP and TRH-MD.  Influenza A positive: - Tamiflu. - Isolation.  SOB: due to asthma. - Will need PFT's as outpatient. - Continue steroids. - Will need pulmonary f/u as outpatient.  PND: - Flonase. - May need anti-histamine.  VCD: - May need speech pathology involvement. - Flutter valve. - PPI BID. - Flonase.  CHF: - Diureses as able. - Maintain at dry weight.  Patient seen and examined, agree with above  note. I dictated the care and orders written for this patient under my direction.  Rush Farmer, MD [phone]

## 2015-12-07 NOTE — Progress Notes (Signed)
Physical Therapy Treatment Patient Details Name: NORENE MINCER MRN: [id number] DOB: Mar 29, 1951 Today's Date: 12/07/2015    History of Present Illness 65 y.o. female admitted to [facility] on 11/28/15 for SOB.  Dx with asthma exacerbation.  Pt wtih significant PMHx of back pain, asthma, DM, HTN, fibromyalgia, CA, recurrent UTIs and urinary urgency, morbid obesity, bil knee arthroscopy, and back surgery.  Of note, pt reports recent fall to right side and buttocks with inability to move well at home (only able to go to the bathroom, was not eating) since the fall. Per MD note there is no fracture.      PT Comments    Patient continues to make gradual progress toward goals but is limited by SOB/fatigue. Continue to progress as tolerated with anticipated d/c to SNF for further skilled PT services.    Follow Up Recommendations  SNF     Equipment Recommendations  None recommended by PT    Recommendations for Other Services       Precautions / Restrictions Precautions Precautions: Fall Precaution Comments: hitory of recent fall Restrictions Weight Bearing Restrictions: No    Mobility  Bed Mobility Overal bed mobility: Needs Assistance Bed Mobility: Supine to Sit     Supine to sit: HOB elevated;Min guard     General bed mobility comments: heavy use of bedrails and increased time  Transfers Overall transfer level: Needs assistance Equipment used: Rolling walker (2 wheeled)   Sit to Stand: Min assist         General transfer comment: assist to power up into standing with increased time needed  Ambulation/Gait Ambulation/Gait assistance: Min guard Ambulation Distance (Feet): 60 Feet (30X2) Assistive device: Rolling walker (2 wheeled) Gait Pattern/deviations: Step-through pattern;Decreased stride length;Trunk flexed     General Gait Details: cues for position of RW, posture, and breathing technique; one seated rest break required; adjusted personal RW to more appropriate  height   Stairs            Wheelchair Mobility    Modified Rankin (Stroke Patients Only)       Balance   Sitting-balance support: Bilateral upper extremity supported;Feet supported Sitting balance-Leahy Scale: Good     Standing balance support: Bilateral upper extremity supported;During functional activity Standing balance-Leahy Scale: Fair                      Cognition Arousal/Alertness: Awake/alert Behavior During Therapy: WFL for tasks assessed/performed Overall Cognitive Status: Within Functional Limits for tasks assessed                      Exercises      General Comments General comments (skin integrity, edema, etc.): 2L 02 via nasal canula      Pertinent Vitals/Pain Pain Assessment: Faces Faces Pain Scale: Hurts a little bit Pain Location: back and head Pain Descriptors / Indicators: Sore Pain Intervention(s): Monitored during session;Premedicated before session    Home Living                      Prior Function            PT Goals (current goals can now be found in the care plan section) Acute Rehab PT Goals Patient Stated Goal: none stated Progress towards PT goals: Progressing toward goals    Frequency  Min 3X/week    PT Plan Current plan remains appropriate    Co-evaluation  End of Session Equipment Utilized During Treatment: Oxygen;Gait belt Activity Tolerance: Patient limited by fatigue Patient left: in chair;with call bell/phone within reach;with chair alarm set     Time: 0832-0908 PT Time Calculation (min) (ACUTE ONLY): 36 min  Charges:  $Gait Training: 8-22 mins $Therapeutic Activity: 8-22 mins                    G Codes:      Salina April, PTA Pager: [phone]   12/07/2015, 9:23 AM

## 2015-12-07 NOTE — Clinical Social Work Note (Signed)
Per MD, patient is not medically stable for d/c. Patient has bed at [facility]. Admissions ppwk completed.  CSW remains available as needed.   Glendon Axe, MSW, LCSWA [phone] 12/07/2015 2:37 PM

## 2015-12-07 NOTE — Progress Notes (Signed)
Inpatient Diabetes Program Recommendations  AACE/ADA: New Consensus Statement on Inpatient Glycemic Control (2015)  Target Ranges:  Prepandial:   less than 140 mg/dL      Peak postprandial:   less than 180 mg/dL (1-2 hours)      Critically ill patients:  140 - 180 mg/dL   Review of Glycemic Control Results for Gina Bender, Gina Bender (MRN [id number]) as of 12/07/2015 13:25  Ref. Range 12/06/2015 07:21 12/06/2015 11:47 12/06/2015 16:28 12/06/2015 21:30 12/07/2015 06:30 12/07/2015 11:32  Glucose-Capillary Latest Ref Range: 65-99 mg/dL 159 (H) 200 (H) 211 (H) 245 (H) 199 (H) 235 (H)   Diabetes history: DM Type 2 Outpatient Diabetes medications: Metformin 1 gm bid + Insulin 75/25 26 units bid Current orders for Inpatient glycemic control: Lantus 15 units daily + Novolog correction scale Moderate 0-15.  Inpatient Diabetes Program Recommendations:  Please consider increase of basal insulin Lantus to 21 units (139 kg. X 0.15 units/kg). Noted in chart review patient only ate 25%. If patients increases eating, may want to consider meal coverage while on steroids.  Thank you, Nani Gasser. Jahaad Penado, RN, MSN, CDE Inpatient Glycemic Control Team Team Pager [phone] (8am-5pm) 12/07/2015 1:46 PM

## 2015-12-08 ENCOUNTER — Encounter (HOSPITAL_COMMUNITY): Payer: Self-pay | Admitting: Radiology

## 2015-12-08 ENCOUNTER — Inpatient Hospital Stay (HOSPITAL_COMMUNITY): Payer: Medicare Other

## 2015-12-08 DIAGNOSIS — I63512 Cerebral infarction due to unspecified occlusion or stenosis of left middle cerebral artery: Secondary | ICD-10-CM

## 2015-12-08 DIAGNOSIS — R0789 Other chest pain: Secondary | ICD-10-CM

## 2015-12-08 LAB — CBC WITH DIFFERENTIAL/PLATELET
BASOS ABS: 0 10*3/uL (ref 0.0–0.1)
Basophils Relative: 0 %
EOS ABS: 0 10*3/uL (ref 0.0–0.7)
EOS PCT: 0 %
HCT: 30.7 % — ABNORMAL LOW (ref 36.0–46.0)
Hemoglobin: 10.1 g/dL — ABNORMAL LOW (ref 12.0–15.0)
LYMPHS ABS: 0.9 10*3/uL (ref 0.7–4.0)
Lymphocytes Relative: 10 %
MCH: 28.8 pg (ref 26.0–34.0)
MCHC: 32.9 g/dL (ref 30.0–36.0)
MCV: 87.5 fL (ref 78.0–100.0)
MONO ABS: 0.7 10*3/uL (ref 0.1–1.0)
Monocytes Relative: 7 %
NEUTROS PCT: 83 %
Neutro Abs: 7.8 10*3/uL — ABNORMAL HIGH (ref 1.7–7.7)
PLATELETS: 152 10*3/uL (ref 150–400)
RBC: 3.51 MIL/uL — AB (ref 3.87–5.11)
RDW: 21.9 % — AB (ref 11.5–15.5)
WBC: 9.4 10*3/uL (ref 4.0–10.5)

## 2015-12-08 LAB — GLUCOSE, CAPILLARY
GLUCOSE-CAPILLARY: 251 mg/dL — AB (ref 65–99)
GLUCOSE-CAPILLARY: 227 mg/dL — AB (ref 65–99)
Glucose-Capillary: 197 mg/dL — ABNORMAL HIGH (ref 65–99)
Glucose-Capillary: 271 mg/dL — ABNORMAL HIGH (ref 65–99)
Glucose-Capillary: 219 mg/dL — ABNORMAL HIGH (ref 65–99)

## 2015-12-08 LAB — TROPONIN I: Troponin I: 0.03 ng/mL (ref <0.031)

## 2015-12-08 LAB — RENAL FUNCTION PANEL
ANION GAP: 10 (ref 5–15)
Albumin: 1.9 g/dL — ABNORMAL LOW (ref 3.5–5.0)
BUN: 37 mg/dL — ABNORMAL HIGH (ref 6–20)
CALCIUM: 8.1 mg/dL — AB (ref 8.9–10.3)
CO2: 29 mmol/L (ref 22–32)
Chloride: 95 mmol/L — ABNORMAL LOW (ref 101–111)
Creatinine, Ser: 1.07 mg/dL — ABNORMAL HIGH (ref 0.44–1.00)
GFR, EST NON AFRICAN AMERICAN: 53 mL/min — AB (ref >60)
Glucose, Bld: 240 mg/dL — ABNORMAL HIGH (ref 65–99)
PHOSPHORUS: 3.3 mg/dL (ref 2.5–4.6)
Potassium: 3.6 mmol/L (ref 3.5–5.1)
SODIUM: 134 mmol/L — AB (ref 135–145)

## 2015-12-08 LAB — MAGNESIUM: MAGNESIUM: 2 mg/dL (ref 1.7–2.4)

## 2015-12-08 MED ORDER — IOPAMIDOL (ISOVUE-370) INJECTION 76%
INTRAVENOUS | Status: AC
  Administered 2015-12-08: 50 mL via INTRAVENOUS
  Filled 2015-12-08: qty 100

## 2015-12-08 MED ORDER — IPRATROPIUM-ALBUTEROL 0.5-2.5 (3) MG/3ML IN SOLN
3.0000 mL | Freq: Three times a day (TID) | RESPIRATORY_TRACT | Status: DC
  Administered 2015-12-08 – 2015-12-12 (×11): 3 mL via RESPIRATORY_TRACT
  Filled 2015-12-08 (×12): qty 3

## 2015-12-08 MED ORDER — IOPAMIDOL (ISOVUE-370) INJECTION 76%
INTRAVENOUS | Status: AC
  Filled 2015-12-08: qty 50

## 2015-12-08 MED ORDER — PREDNISONE 20 MG PO TABS
50.0000 mg | ORAL_TABLET | Freq: Two times a day (BID) | ORAL | Status: DC
  Administered 2015-12-08 – 2015-12-10 (×4): 50 mg via ORAL
  Filled 2015-12-08 (×3): qty 2

## 2015-12-08 NOTE — Progress Notes (Signed)
Inpatient Diabetes Program Recommendations  AACE/ADA: New Consensus Statement on Inpatient Glycemic Control (2015)  Target Ranges:  Prepandial:   less than 140 mg/dL      Peak postprandial:   less than 180 mg/dL (1-2 hours)      Critically ill patients:  140 - 180 mg/dL   Review of Glycemic Control Results for Gina Bender, Gina Bender (MRN [id number]) as of 12/08/2015 12:43  Ref. Range 12/07/2015 16:58 12/07/2015 21:20 12/08/2015 06:34 12/08/2015 09:07 12/08/2015 11:42  Glucose-Capillary Latest Ref Range: 65-99 mg/dL 289 (H) 295 (H) 197 (H) 251 (H) 227 (H)    Inpatient Diabetes Program Recommendations:  Please consider starting patient on 75/25 insulin due to this is what patient is on @ home. May want to start with 75/25 insulin [place] 15 units bid.  Thank you, Nani Gasser. Renley Banwart, RN, MSN, CDE Inpatient Glycemic Control Team Team Pager [phone] (8am-5pm) 12/08/2015 12:45 PM
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
## 2015-12-08 NOTE — Code Documentation (Signed)
Gina Bender was called a code stroke after family stated she was slower in responding to them than she had previously been.  Her LKW is unknown though the day shift nurse reports she believes the pt was similar to her current mental status yesterday during the day time hours. On my arrival she was sitting in the chair and was able to transfer to the bed with a walker and assistance from 2 nurses. On assessment she is able to state her name but not her age or the month.  She is able to f/c and drifts both Rt upper and lower ext.  She has sensory impairment on the Rt side and slight Rt facial droop.  NIH 8.  She was taken to CT scan where the neurologist met [place].

## 2015-12-08 NOTE — Progress Notes (Addendum)
[facility] Progress Note  Patient: Gina Bender [id number]   PCP: Nyoka Cowden, MD DOB: May 24, 1951   DOA: 11/28/2015   DOS: 12/08/2015   Date of Service: the patient was seen and examined on 12/08/2015  Subjective: The patient is bedbound and tired. Denies having any complaints of nausea or vomiting. No abdominal pain no diarrhea. Did complain of some chest pain. Which resolved on its own. Nutrition: Tolerating oral intake  Brief hospital course: Patient was admitted on 11/28/2015, with complaint of cough and shortness of breath, was found to have asthma exacerbation with acute hypoxic respiratory failure. For UTI the patient was started on IV ceftriaxone, along with that Flagyl and probiotics were added to prevent C. difficile because the patient recently completed her treatment for C. difficile antibiotic. Patient was on Solu-Medrol and nebulizers. Patient also had acute kidney injury and she was given some fluids and her medications were on hold. Despite aggressive treatment the patient was not improving and further workup was obtained, On 12/04/2015 influenza PCR came back positive. She was started on Tamiflu. Patient was also started on Lasix since her renal functions improve and she was appearing to be volume overloaded. Pulmonary was consulted since there was no significant improvement in patient's condition was recommended to continue current management. Currently further plan is continue nebulizers and Solu-Medrol and continue aggressive diuresis.  Assessment and Plan: 1. Asthma exacerbation  Influenza A Vocal cord dysfunction causing upper expiratory wheezes. Gradually improving I will reduce the Solu-Medrol to oral prednisone every 12 hours ABG shows mild hypoxia. Patient on Tamiflu. Continue duo nebs.  Pulmonary consulted to assist in management. Appreciate input, PPI added. Outpatient PFT, and continue allergy regimen on outpatient as well.  2. Obesity  hypoventilation syndrome, obstructive sleep apnea. The patient is tolerating C Pap. Continue daily at bedtime. Case management consulted for providing patient equipment at home.  3. UTI. Recent C. difficile. Patient has been started on IV ceftriaxone on admission  Completed 3 days of treatment with antibiotics. Urine culture negative for any growth Patient was given Flagyl to prevent C. difficile infection which is now stopped.  4. Constipation. C. Difficile. Report from the patient and the staff are different on bowel movements Continue oral MiraLAX and Senokot.  5. Acute kidney injury. Hyperkalemia. Currently resolved. Likely from dehydration. Now back on Lasix  6. Acute on Chronic diastolic dysfunction. Sinus bradycardia Essential hypertension Echocardiogram shows grade 1 diastolic dysfunction with normal ejection fraction. No valvular dysfunction. Increase IV Lasix, Foley catheter for monitoring of urine output. Recommend nursing to check daily weight. discontinue atenolol, due to bradycardia. On Imdur hydralazine combination. Due to recent AKI with hyperkalemia avoiding ACE inhibitor or ARB.  7. Type 2 diabetes mellitus. Hemoglobin A1c well controlled, 6.1 on 11/24/2015 Continue home regimen. Continue sliding scale in the setting of steroid-induced hyperglycemia  8. Hypothyroidism. Continue Synthroid. TSH significantly elevated on 321. We will recheck TSH and free T4.  9. Chronic pain syndrome. diabetic neuropathy. Mood disorder Continue gabapentin continue Paxil continue Zanaflex.  10. Noncardiac chest pain. Patient has complained of chest pain multiple times throughout the hospitalization. Echocardiogram does not show any wall motion abnormalities.  troponin multiple times are not elevated as well. EKG also unremarkable. Likely due to respiratory illness. Reassured patient multiple times.  Activity: physical therapy SNF, patient has a bed  availability Bowel regimen: Last BM 12/06/2015 DVT Prophylaxis: subcutaneous Heparin Nutrition: Carb modified heart healthy diet Advance goals of care discussion: Full code  HPI: As per  the H and P dictated on admission, "Gina Bender is a 65 y.o. female with pmh of asthma who is brought in by ambulance. She presents to the ED with c/o SOB onset 2 days PTA that recently worsened today. Associated cough and wheezing. No fever, chills. Inhalor provides no relief. No h/o COPD, does have h/o OSA with a CPAP machine that she dosent really use.  Patient also has h/o C.Diff 3 weeks prior, had been having diarrhea since then but hasnt had a BM in several days now.  Patient reports R hip pain since a fall a few days back. This wasn't fractured, but she is essentially bed bound at this point and struggling to take care of herself due to extreme obesity recently." Procedures: Echocardiogram Consultants: Pulmonary Antibiotics: Anti-infectives    Start     Dose/Rate Route Frequency Ordered Stop   12/04/15 2200  oseltamivir (TAMIFLU) capsule 75 mg     75 mg Oral 2 times daily 12/04/15 1723 12/09/15 2159   11/30/15 1500  metroNIDAZOLE (FLAGYL) tablet 500 mg  Status:  Discontinued     500 mg Oral 3 times per day 11/30/15 1346 12/02/15 1634   11/29/15 1530  cefTRIAXone (ROCEPHIN) 2 g in dextrose 5 % 50 mL IVPB  Status:  Discontinued     2 g 100 mL/hr over 30 Minutes Intravenous Every 24 hours 11/29/15 1424 12/02/15 1634   11/29/15 1315  doxycycline (VIBRAMYCIN) 100 mg in dextrose 5 % 250 mL IVPB  Status:  Discontinued     100 mg 125 mL/hr over 120 Minutes Intravenous Every 12 hours 11/29/15 1301 11/30/15 1346     Family Communication: no family was present at bedside, at the time of interview.   Disposition:  Expected discharge date: 12/10/2015 Barriers to safe discharge: Improvement in oxygenation and volume status   Intake/Output Summary (Last 24 hours) at 12/08/15 1507 Last data  filed at 12/08/15 0640  Gross per 24 hour  Intake    360 ml  Output   2300 ml  Net  -1940 ml   Filed Weights   12/05/15 0005 12/05/15 0500 12/07/15 1000  Weight: 138.982 kg (306 lb 6.4 oz) 138.982 kg (306 lb 6.4 oz) 139.027 kg (306 lb 8 oz)    Objective: Physical Exam: Filed Vitals:   12/07/15 1949 12/07/15 2213 12/08/15 0500 12/08/15 0837  BP:  116/51 102/42   Pulse: 89 79 66 73  Temp:  97.5 F (36.4 C) 97.6 F (36.4 C)   TempSrc:      Resp: 18 18  16   Height:      Weight:      SpO2: 94% 94% 92% 93%    General: Appear in mild distress, no Rash; Oral Mucosa Shows thrush. Cardiovascular: S1 and S2 Present, no Murmur, Difficult to assess JVD Respiratory: Bilateral Air entry present and no Crackles, bilateral wheezes Abdomen: Bowel Sound present, Soft and no tenderness Extremities: bilateral Pedal edema, no calf tenderness  Data Reviewed: CBC:  Recent Labs Lab 12/03/15 0725 12/04/15 0700 12/05/15 0520 12/07/15 0425 12/08/15 0825  WBC 5.1 8.1 7.1 8.1 9.4  NEUTROABS 3.7  --  5.5  --  7.8*  HGB 11.0* 10.6* 10.3* 9.9* 10.1*  HCT 32.0* 30.6* 31.5* 29.7* 30.7*  MCV 87.9 86.7 87.5 87.1 87.5  PLT 100* 131* 159 139* 0000000   Basic Metabolic Panel:  Recent Labs Lab 12/03/15 0725 12/04/15 0700 12/05/15 0520 12/07/15 0425 12/08/15 0825  NA 139 138 137 136 134*  K  3.8 3.5 3.7 4.3 3.6  CL 100* 102 100* 99* 95*  CO2 33* 29 31 26 29   GLUCOSE 172* 136* 146* 198* 240*  BUN 18 17 18  35* 37*  CREATININE 0.97 0.72 0.91 1.09* 1.07*  CALCIUM 8.0* 7.9* 7.9* 8.0* 8.1*  MG 1.7 1.6* 1.6* 2.0 2.0  PHOS  --   --   --   --  3.3   Liver Function Tests:  Recent Labs Lab 12/03/15 0725 12/08/15 0825  AST 91*  --   ALT 49  --   ALKPHOS 124  --   BILITOT 1.3*  --   PROT 6.1*  --   ALBUMIN 1.7* 1.9*   No results for input(s): LIPASE, AMYLASE in the last 168 hours. No results for input(s): AMMONIA in the last 168 hours.  Cardiac Enzymes:  Recent Labs Lab 12/05/15 0118  12/05/15 0520 12/05/15 1148 12/08/15 1318  TROPONINI 0.03 <0.03 0.03 0.03    BNP (last 3 results)  Recent Labs  11/28/15 0204  BNP 256.3*    CBG:  Recent Labs Lab 12/07/15 1658 12/07/15 2120 12/08/15 0634 12/08/15 0907 12/08/15 1142  GLUCAP 289* 295* 197* 251* 227*    No results found for this or any previous visit (from the past 240 hour(s)).   Studies: No results found.   Scheduled Meds: . acidophilus  1 capsule Oral Daily  . enoxaparin (LOVENOX) injection  70 mg Subcutaneous Q24H  . fluticasone  2 spray Each Nare Daily  . furosemide  80 mg Intravenous Q12H  . gabapentin  300 mg Oral BID  . hydrALAZINE  25 mg Oral 3 times per day  . insulin aspart  0-15 Units Subcutaneous TID WC  . insulin glargine  15 Units Subcutaneous Daily  . ipratropium-albuterol  3 mL Nebulization TID  . isosorbide mononitrate  30 mg Oral Daily  . levothyroxine  175 mcg Oral QAC breakfast  . magic mouthwash  5 mL Oral QID  .  morphine injection  1 mg Intravenous Once  . oseltamivir  75 mg Oral BID  . oxybutynin  10 mg Oral BID  . pantoprazole  40 mg Oral BID  . PARoxetine  40 mg Oral Daily  . polyethylene glycol  17 g Oral BID  . predniSONE  50 mg Oral BID WC  . senna-docusate  1 tablet Oral BID   Continuous Infusions:  PRN Meds: acetaminophen, albuterol, nitroGLYCERIN, tiZANidine  Time spent: 30 minutes  Author: Berle Mull, MD [facility] Pager: [phone] 12/08/2015 3:07 PM  If 7PM-7AM, please contact night-coverage at [email or web address], password [facility]

## 2015-12-08 NOTE — Progress Notes (Signed)
Name: Gina Bender MRN: [id number] DOB: [birth year]    ADMISSION DATE:  11/28/2015 CONSULTATION DATE:  12/07/15  REFERRING MD :  Posey Pronto  CHIEF COMPLAINT:  SOB   HISTORY OF PRESENT ILLNESS:  Gina Bender is a 65 y.o. female with a PMH as outlined below.  She was admitted 12/07/15 with asthma exacerbation.  She was started on steroids, BD's, abx.  During her admission, she was tested for flu for which she was positive (influenza A by flu PCR).  She had gradual improvement but as of 12/07/15, continued to have some SOB, mainly with exertion, along with productive cough.  PCCM was called for further recs.  She denies any fevers/chills/sweats, chest pain, N/V/D, abd pain, myalgias.  She has OSA / OHS for which she is on nocturnal CPAP.  She states she has one at home but does not use it as prescribed because she does not like the way that it fits.  She has been using the one here in the hospital and likes it much better.  She has asked if we can change her mask at home so that she can hopefully be compliant each night.   SUBJECTIVE:  Feels that SOB is somewhat improved particularly at rest.  VITAL SIGNS: Temp:  [97.5 F (36.4 C)-98.8 F (37.1 C)] 97.6 F (36.4 C) (04/04 0500) Pulse Rate:  [66-89] 66 (04/04 0500) Resp:  [17-18] 18 (04/03 2213) BP: (102-116)/(42-51) 102/42 mmHg (04/04 0500) SpO2:  [92 %-94 %] 93 % (04/04 0837) Weight:  [139.027 kg (306 lb 8 oz)] 139.027 kg (306 lb 8 oz) (04/03 1000)  PHYSICAL EXAMINATION: General: Adult female, obese, sitting in recliner, in NAD. Neuro: A&O x 3, non-focal.  HEENT: [place]/AT. PERRL, sclerae anicteric. Cardiovascular: RRR, no M/R/G.  Lungs: Respirations even and unlabored.  Faint wheeze, mainly upper airway. Abdomen: Obese, BS x 4, soft, NT/ND.  Musculoskeletal: No gross deformities, 1+ edema.  Skin: Intact, warm, no rashes.     Recent Labs Lab 12/04/15 0700 12/05/15 0520 12/07/15 0425  NA 138 137 136  K 3.5 3.7 4.3  CL  102 100* 99*  CO2 29 31 26   BUN 17 18 35*  CREATININE 0.72 0.91 1.09*  GLUCOSE 136* 146* 198*    Recent Labs Lab 12/04/15 0700 12/05/15 0520 12/07/15 0425  HGB 10.6* 10.3* 9.9*  HCT 30.6* 31.5* 29.7*  WBC 8.1 7.1 8.1  PLT 131* 159 139*   No results found.  STUDIES:  CXR 04/01 > atx.  SIGNIFICANT EVENTS  03/25 > admit 04/03 > PCCM consult.  ASSESSMENT / PLAN:  Acute hypoxic respiratory failure - due to influenza A + asthma exacerbation. Asthma exacerbation. OSA / OHS - on nocturnal CPAP but noncompliant as outpatient.  Asking if she can have a different mask. Cough. Atelectasis. ? VCD.  Plan: Continue supplemental O2 as needed to maintain SpO2 > 92%. Recommend ambulatory desat study prior to discharge to assess for home O2 needs (ordered). Continue tamiflu, BD's, steroids (wean). Continue nocturnal CPAP. Will ask case management to assist in obtaining different mask. Would have pt follow up with pulmonary as outpatient and obtain PFT's at that time. Add PPI, flonase, flutter valve. Should probably continue PPI and allergy regimen as an outpt [employer] as able.  Baltazar Apo, MD, PhD 12/08/2015, 8:43 AM [facility] [phone] or if no answer [phone]

## 2015-12-08 NOTE — Clinical Social Work Note (Addendum)
CSW updated [facility] that patient is not medically ready for discharge yet today per MD updated clinicals faxed to [facility].  CSW to continue to follow patient's progress.  Gina Bender. [place], MSW, [place] 12/08/2015 12:36 PM

## 2015-12-08 NOTE — Progress Notes (Addendum)
Called to see pt for ? Stroke sx. After seeing pt, code stroke called. Spoke to neuro, RRRN here and headed to stat CT head. Full note to follow.  Shift event: Ms. Iglesia was admitted for asthma exacerbation. Tonight, this NP was called because family member reported that pt is more lethargic, weaker, and having trouble finding words. Niece says she was not like this before. Per family, her normal state is she is a very quick thinker and has no mental baseline abnormalities. Never had a stroke.  S: Pt states she is "not sure" how she is feeling. "trying to do OK". She feels more tired and thinks something is wrong, but can not pin point exactly what that is. Above info per family. Per RN, pt has been this was for the last 2 night shifts. However, the RN that had the pt Sunday night, says this is different.  O: Fatigued appearing, morbidly obese WF in NAD. Sitting quietly in chair at bedside. Alert, but slow in speech and actions. Oriented to name, but not age or date. Neuro: PERRL. Follows commands. + OD ptosis noted and slight right facial droop. Decreased sensation to right side of face and inner right thigh, calf area. Strength slightly less on the right with RUE/RLE, shoulder shrug, grip, and face. Tongue is midline. Able to identify objects correctly. Repeats words correctly. NIH per RRRN 8.  A/P: 1. Right sided weakness with dysarthria-code stroke was called. NP spoke to neuro who met [place] in CT. CTA completed. CT showed no stroke, but angiogram is not complete. Talked to family. Neuro spoke to family as well and per neuro, will get stat MRI now as well. To look for sources other than stroke, will r/p UA (as she had UTI) and get some general labs.  Spoke again to family and told them we would f/up again after MRI and neuro should talk with them as well.  KJKG, NP [facility] Update: Pt's vitals stable. This NP spoke with neuro who think the pt likely had a stroke and are awaiting the MRI for  final dx. CT/CTA neg. Start pt on ASA daily. For now, suppository and NPO until pt passes swallow study.  See complete neuro consult.  KJKG, NP [facility] Update: Pt going for MRI brain now. ASA given. Awaiting swallow study.  KJKG, NP [facility] Update: MRI finished. Pictures up but not official read. Called neuro and asked them to review. Neuro will let this NP know the results. KJKG, NP Update: MRI neg for stroke. Attempted to call husband, but his VM does not say his name so did not leave message. Called son and LM about neg MRI and asked him to call his father. Daily ASA d/c'd. Diet reordered. Labs showed elevated WBCC. UA was negative, so infection has cleared. CXR pending. KJKG, NP [facility]

## 2015-12-08 NOTE — Progress Notes (Signed)
Physical Therapy Treatment Patient Details Name: Gina Bender MRN: [id number] DOB: Oct 29, 1950 Today's Date: 12/08/2015    History of Present Illness 65 y.o. female admitted to [facility] on 11/28/15 for SOB.  Dx with asthma exacerbation.  Pt wtih significant PMHx of back pain, asthma, DM, HTN, fibromyalgia, CA, recurrent UTIs and urinary urgency, morbid obesity, bil knee arthroscopy, and back surgery.  Of note, pt reports recent fall to right side and buttocks with inability to move well at home (only able to go to the bathroom, was not eating) since the fall. Per MD note there is no fracture.      PT Comments    Patient with increased activity tolerance on 2L O2 this session. Continue to progress as tolerated with anticipated d/c to SNF for further skilled PT services.    Follow Up Recommendations  SNF     Equipment Recommendations  None recommended by PT    Recommendations for Other Services       Precautions / Restrictions Precautions Precautions: Fall Precaution Comments: hitory of recent fall Restrictions Weight Bearing Restrictions: No    Mobility  Bed Mobility Overal bed mobility: Needs Assistance Bed Mobility: Supine to Sit Rolling: Mod assist   Supine to sit: HOB elevated;Min assist     General bed mobility comments: cues for technique and sequencing with heavy use of bed rails for assist; mod A to roll with tactile cues needed for technique and use of bedrail to maintain sidelying; assist to scoot hips to EOB with use of bedpad   Transfers Overall transfer level: Needs assistance Equipment used: Rolling walker (2 wheeled) Transfers: Sit to/from Stand Sit to Stand: Min assist;+2 physical assistance         General transfer comment: assist to power up with cues for hand placement and technique from EOB and recliner with use of momemtum needed from recliner  Ambulation/Gait Ambulation/Gait assistance: Min guard;+2 safety/equipment Ambulation Distance (Feet):  60 Feet (30X2) Assistive device: Rolling walker (2 wheeled) Gait Pattern/deviations: Step-through pattern;Decreased stride length;Trunk flexed     General Gait Details: cues for posture, pursed lip breathing, and position of RW; pt on 2L O2 via nasal canula throughout session; pt SOB with one seated rest break needed; SpO2 90-95% with mobility   Stairs            Wheelchair Mobility    Modified Rankin (Stroke Patients Only)       Balance     Sitting balance-Leahy Scale: Good       Standing balance-Leahy Scale: Fair                      Cognition Arousal/Alertness: Awake/alert Behavior During Therapy: WFL for tasks assessed/performed Overall Cognitive Status: Within Functional Limits for tasks assessed                      Exercises      General Comments General comments (skin integrity, edema, etc.): RN present and assisting with mobility       Pertinent Vitals/Pain Pain Assessment: No/denies pain Pain Intervention(s): Monitored during session    Home Living                      Prior Function            PT Goals (current goals can now be found in the care plan section) Acute Rehab PT Goals Patient Stated Goal: none stated Progress towards PT goals: Progressing toward  goals    Frequency  Min 3X/week    PT Plan Current plan remains appropriate    Co-evaluation             End of Session Equipment Utilized During Treatment: Oxygen;Gait belt Activity Tolerance: Patient limited by fatigue Patient left: in chair;with call bell/phone within reach;with chair alarm set     Time: [id number] PT Time Calculation (min) (ACUTE ONLY): 39 min  Charges:  $Gait Training: 8-22 mins $Therapeutic Activity: 8-22 mins                    G Codes:      Salina April, PTA Pager: [phone]   12/08/2015, 4:42 PM

## 2015-12-09 ENCOUNTER — Inpatient Hospital Stay (HOSPITAL_COMMUNITY): Payer: Medicare Other

## 2015-12-09 DIAGNOSIS — J690 Pneumonitis due to inhalation of food and vomit: Secondary | ICD-10-CM

## 2015-12-09 DIAGNOSIS — R278 Other lack of coordination: Secondary | ICD-10-CM | POA: Diagnosis present

## 2015-12-09 DIAGNOSIS — J9601 Acute respiratory failure with hypoxia: Secondary | ICD-10-CM

## 2015-12-09 DIAGNOSIS — I5033 Acute on chronic diastolic (congestive) heart failure: Secondary | ICD-10-CM

## 2015-12-09 DIAGNOSIS — G9340 Encephalopathy, unspecified: Secondary | ICD-10-CM | POA: Diagnosis present

## 2015-12-09 LAB — GLUCOSE, CAPILLARY
GLUCOSE-CAPILLARY: 148 mg/dL — AB (ref 65–99)
Glucose-Capillary: 167 mg/dL — ABNORMAL HIGH (ref 65–99)
Glucose-Capillary: 162 mg/dL — ABNORMAL HIGH (ref 65–99)
Glucose-Capillary: 167 mg/dL — ABNORMAL HIGH (ref 65–99)

## 2015-12-09 LAB — CBC WITH DIFFERENTIAL/PLATELET
Basophils Absolute: 0 10*3/uL (ref 0.0–0.1)
Basophils Relative: 0 %
EOS ABS: 0 10*3/uL (ref 0.0–0.7)
EOS PCT: 0 %
HCT: 30 % — ABNORMAL LOW (ref 36.0–46.0)
HEMOGLOBIN: 10.2 g/dL — AB (ref 12.0–15.0)
LYMPHS PCT: 6 %
Lymphs Abs: 0.8 10*3/uL (ref 0.7–4.0)
MCH: 29.5 pg (ref 26.0–34.0)
MCHC: 34 g/dL (ref 30.0–36.0)
MCV: 86.7 fL (ref 78.0–100.0)
MONO ABS: 1.3 10*3/uL — AB (ref 0.1–1.0)
Monocytes Relative: 10 %
NEUTROS PCT: 84 %
Neutro Abs: 11.1 10*3/uL — ABNORMAL HIGH (ref 1.7–7.7)
PLATELETS: 118 10*3/uL — AB (ref 150–400)
RBC: 3.46 MIL/uL — AB (ref 3.87–5.11)
RDW: 21.9 % — ABNORMAL HIGH (ref 11.5–15.5)
WBC: 13.2 10*3/uL — AB (ref 4.0–10.5)

## 2015-12-09 LAB — COMPREHENSIVE METABOLIC PANEL
ALT: 55 U/L — AB (ref 14–54)
AST: 76 U/L — AB (ref 15–41)
Albumin: 1.9 g/dL — ABNORMAL LOW (ref 3.5–5.0)
Alkaline Phosphatase: 123 U/L (ref 38–126)
Anion gap: 10 (ref 5–15)
BUN: 37 mg/dL — AB (ref 6–20)
CHLORIDE: 92 mmol/L — AB (ref 101–111)
CO2: 28 mmol/L (ref 22–32)
CREATININE: 1.05 mg/dL — AB (ref 0.44–1.00)
Calcium: 7.9 mg/dL — ABNORMAL LOW (ref 8.9–10.3)
GFR calc Af Amer: >60 mL/min (ref >60)
GFR calc non Af Amer: 54 mL/min — ABNORMAL LOW (ref >60)
GLUCOSE: 221 mg/dL — AB (ref 65–99)
Potassium: 3.9 mmol/L (ref 3.5–5.1)
SODIUM: 130 mmol/L — AB (ref 135–145)
Total Bilirubin: 2.8 mg/dL — ABNORMAL HIGH (ref 0.3–1.2)
Total Protein: 5.6 g/dL — ABNORMAL LOW (ref 6.5–8.1)

## 2015-12-09 LAB — TSH: TSH: 0.639 u[IU]/mL (ref 0.350–4.500)

## 2015-12-09 LAB — URINALYSIS, ROUTINE W REFLEX MICROSCOPIC
BILIRUBIN URINE: NEGATIVE
Glucose, UA: NEGATIVE mg/dL
HGB URINE DIPSTICK: NEGATIVE
Ketones, ur: NEGATIVE mg/dL
Leukocytes, UA: NEGATIVE
NITRITE: NEGATIVE
PROTEIN: NEGATIVE mg/dL
SPECIFIC GRAVITY, URINE: 1.011 (ref 1.005–1.030)
pH: 7 (ref 5.0–8.0)

## 2015-12-09 LAB — MAGNESIUM: Magnesium: 1.9 mg/dL (ref 1.7–2.4)

## 2015-12-09 LAB — RENAL FUNCTION PANEL
ANION GAP: 12 (ref 5–15)
Albumin: 1.8 g/dL — ABNORMAL LOW (ref 3.5–5.0)
BUN: 37 mg/dL — ABNORMAL HIGH (ref 6–20)
CALCIUM: 7.6 mg/dL — AB (ref 8.9–10.3)
CHLORIDE: 92 mmol/L — AB (ref 101–111)
CO2: 27 mmol/L (ref 22–32)
CREATININE: 0.89 mg/dL (ref 0.44–1.00)
Glucose, Bld: 163 mg/dL — ABNORMAL HIGH (ref 65–99)
Phosphorus: 3.4 mg/dL (ref 2.5–4.6)
Potassium: 5.4 mmol/L — ABNORMAL HIGH (ref 3.5–5.1)
Sodium: 131 mmol/L — ABNORMAL LOW (ref 135–145)

## 2015-12-09 LAB — T4, FREE: FREE T4: 1.65 ng/dL — AB (ref 0.61–1.12)

## 2015-12-09 MED ORDER — ASPIRIN 300 MG RE SUPP
300.0000 mg | Freq: Every day | RECTAL | Status: DC
  Administered 2015-12-09: 300 mg via RECTAL
  Filled 2015-12-09: qty 1

## 2015-12-09 MED ORDER — GABAPENTIN 100 MG PO CAPS
200.0000 mg | ORAL_CAPSULE | Freq: Two times a day (BID) | ORAL | Status: DC
  Administered 2015-12-09 – 2015-12-15 (×12): 200 mg via ORAL
  Filled 2015-12-09 (×12): qty 2

## 2015-12-09 MED ORDER — GUAIFENESIN ER 600 MG PO TB12
600.0000 mg | ORAL_TABLET | Freq: Two times a day (BID) | ORAL | Status: DC
  Administered 2015-12-09 – 2015-12-15 (×13): 600 mg via ORAL
  Filled 2015-12-09 (×13): qty 1

## 2015-12-09 MED ORDER — SODIUM CHLORIDE 0.9 % IV SOLN
3.0000 g | Freq: Four times a day (QID) | INTRAVENOUS | Status: DC
  Administered 2015-12-09 – 2015-12-12 (×12): 3 g via INTRAVENOUS
  Filled 2015-12-09 (×16): qty 3

## 2015-12-09 MED ORDER — FUROSEMIDE 10 MG/ML IJ SOLN
80.0000 mg | Freq: Three times a day (TID) | INTRAMUSCULAR | Status: DC
  Administered 2015-12-09 – 2015-12-12 (×9): 80 mg via INTRAVENOUS
  Filled 2015-12-09 (×10): qty 8

## 2015-12-09 MED ORDER — SODIUM POLYSTYRENE SULFONATE 15 GM/60ML PO SUSP
15.0000 g | Freq: Once | ORAL | Status: AC
  Administered 2015-12-09: 15 g via ORAL
  Filled 2015-12-09: qty 60

## 2015-12-09 MED ORDER — SACCHAROMYCES BOULARDII 250 MG PO CAPS
250.0000 mg | ORAL_CAPSULE | Freq: Two times a day (BID) | ORAL | Status: DC
  Administered 2015-12-09 – 2015-12-15 (×13): 250 mg via ORAL
  Filled 2015-12-09 (×13): qty 1

## 2015-12-09 NOTE — Progress Notes (Addendum)
[facility] PROGRESS NOTE  Gina Bender [id number] DOB: April 28, 1951 DOA: 11/28/2015 PCP: Nyoka Cowden, MD  Brief narrative 65 year old morbidly obese female with history of vocal cord dysfunction, asthma, 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 Hospital course was prolonged due to acute kidney injury, positive influenza A and acute on chronic diastolic CHF. Patient being treated for all these. Pulmonary following given her persistent respiratory symptoms. Overnight on 4/4-4/5 and had acute onset of right-sided weakness with slow speech. Code stroke was called. Did not receive TPA as onset of symptoms was not clear. Head CT and MRI brain was unremarkable. Chest x-ray showed worsened right lower lobe consolidation.  Assessment/Plan: Acute hypoxic respiratory failure Combination of asthma exacerbation, influenza A, acute on chronic diastolic CHF and possible aspiration pneumonia. -Still requiring 2 L O2 via nasal cannula and has significant congestion. I have increased her Lasix to 80 mg 3 times a day. Completes Tamiflu today. Continue DuoNeb's.Wean steroids as tolerated. -Continue Mucinex and chest PT. Continue Flonase, flutter valve and incentive spirometry.  -Patient reports choking on her food periodically. Will ask speech and swallow to evaluate. -Appreciate pulmonary follow-up. Needs to check for home O2 requirement prior to discharge and outpatient PFTs.  Acute right-sided weakness on 4/5 Code Stroke called. Imaging (CT angiogram head and neck and MRI brain unremarkable). No residual weakness but still has slow speech. No clear etiology. Chest x-ray with worsened right lower lobe infiltrate,  suspicion for aspiration.   Acute on chronic diastolic CHF Appears quite overloaded.  Increase IV Lasix to 80 mg 3 times a day. Monitor strict I/O and daily weight. Has Foley catheter for strict I/O monitoring. 2-D echo shows normal EF with grade 1 diastolic dysfunction.  ? Aspiration pneumonia Worsened right lower lobe consolidation seen on chest x-ray this morning. Added empiric Unasyn. Speech and swallow consulted. Has vocal cord dysfunction as well. Add florostor.  Influenza A Completes Tamiflu today.  Obesity hypoventilation syndrome/OSA Continue nighttime CPAP.  UTI Recent history of C. difficile. Received 3 days of IV Rocephin this admission.  Hyperkalemia Added Kayexalate.   diabetes mellitus type 2 Daily Lantus with sliding scale coverage. Well controlled as outpatient (A1c of 6.1) . Neurontin for peripheral neuropathy.  Essential hypertension Continue BiDil. Discontinued atenolol due to sinus bradycardia.  Hypothyroidism Continue Synthroid  Morbid obesity   Prophylaxis: Subcutaneous Lovenox  Diet diabetic/ Heart healthy  Code Status: Full code Family Communication: husband at bedside Disposition Plan: Pending clinical improvement. PT recommend skilled nursing facility.   Consultants:  Pulmonary  Procedures:  2-D echo  Antibiotics:  Completed 2 days of IV Rocephin  IV Unasyn since 4/5  HPI/Subjective: Seen and examined. Had acute onset of right-sided weakness with slow speech overnight. Code stroke called. Head CT and MRI brain unremarkable. No further weakness noted but speech still slow.  Objective: Filed Vitals:   12/09/15 0500 12/09/15 0800  BP: 90/40 108/56  Pulse: 74   Temp: 98.4 F (36.9 C)   Resp:      Intake/Output Summary (Last 24 hours) at 12/09/15 0856 Last data filed at 12/09/15 0500  Gross per 24 hour  Intake    360 ml  Output   1700 ml  Net  -1340 ml   Filed Weights   12/05/15 0005 12/05/15 0500 12/07/15 1000  Weight: 138.982 kg (306 lb 6.4 oz) 138.982 kg (306  lb 6.4 oz) 139.027 kg (306 lb 8 oz)     Exam:   General:  Obese, congested, fatigued  HEENT: congested, no pallor, moist mucosa  Cardiovascular: NS1&S2, no Murmurs  Respiratory: coarse breath sounds b/l  Abdomen: soft, NT, ND, BS+, foley+  Musculoskeletal: 2+ pitting edema b/l  CNS: AAOX3, slow speech, non focal  Data Reviewed: Basic Metabolic Panel:  Recent Labs Lab 12/04/15 0700 12/05/15 0520 12/07/15 0425 12/08/15 0825 12/09/15 12/09/15 0555  NA 138 137 136 134* 130* 131*  K 3.5 3.7 4.3 3.6 3.9 5.4*  CL 102 100* 99* 95* 92* 92*  CO2 29 31 26 29 28 27   GLUCOSE 136* 146* 198* 240* 221* 163*  BUN 17 18 35* 37* 37* 37*  CREATININE 0.72 0.91 1.09* 1.07* 1.05* 0.89  CALCIUM 7.9* 7.9* 8.0* 8.1* 7.9* 7.6*  MG 1.6* 1.6* 2.0 2.0 1.9  --   PHOS  --   --   --  3.3  --  3.4   Liver Function Tests:  Recent Labs Lab 12/03/15 0725 12/08/15 0825 12/09/15 12/09/15 0555  AST 91*  --  76*  --   ALT 49  --  55*  --   ALKPHOS 124  --  123  --   BILITOT 1.3*  --  2.8*  --   PROT 6.1*  --  5.6*  --   ALBUMIN 1.7* 1.9* 1.9* 1.8*   No results for input(s): LIPASE, AMYLASE in the last 168 hours. No results for input(s): AMMONIA in the last 168 hours. CBC:  Recent Labs Lab 12/03/15 0725 12/04/15 0700 12/05/15 0520 12/07/15 0425 12/08/15 0825 12/09/15  WBC 5.1 8.1 7.1 8.1 9.4 13.2*  NEUTROABS 3.7  --  5.5  --  7.8* 11.1*  HGB 11.0* 10.6* 10.3* 9.9* 10.1* 10.2*  HCT 32.0* 30.6* 31.5* 29.7* 30.7* 30.0*  MCV 87.9 86.7 87.5 87.1 87.5 86.7  PLT 100* 131* 159 139* 152 118*   Cardiac Enzymes:  Recent Labs Lab 12/05/15 0118 12/05/15 0520 12/05/15 1148 12/08/15 1318  TROPONINI 0.03 <0.03 0.03 0.03   BNP (last 3 results)  Recent Labs  11/28/15 0204  BNP 256.3*    ProBNP (last 3 results) No results for input(s): PROBNP in the last 8760 hours.  CBG:  Recent Labs Lab 12/08/15 0907 12/08/15 1142 12/08/15 1634 12/08/15 2034 12/09/15 0640  GLUCAP 251* 227* 271* 219* 167*    No results  found for this or any previous visit (from the past 240 hour(s)).   Studies: Ct Angio Head W/cm &/or Wo Cm  12/08/2015  CLINICAL DATA:  Initial evaluation for acute right-sided weakness. EXAM: CT ANGIOGRAPHY HEAD AND NECK TECHNIQUE: Multidetector CT imaging of the head and neck was performed using the standard protocol during bolus administration of intravenous contrast. Multiplanar CT image reconstructions and MIPs were obtained to evaluate the vascular anatomy. Carotid stenosis measurements (when applicable) are obtained utilizing NASCET criteria, using the distal internal carotid diameter as the denominator. CONTRAST:  50 cc of Isovue 370. COMPARISON:  Prior head CT from earlier the same day. FINDINGS: CTA NECK Aortic arch:  Study limited by timing of the contrast bolus. Visualized aortic arch of normal caliber with normal branch pattern. No high-grade stenosis at the origin of the great vessels. Visualized subclavian arteries patent. Right carotid system: The right common carotid artery patent from its origin to the bifurcation. No significant atheromatous plaque about the right carotid bifurcation. Right ICA patent from the bifurcation to the skullbase without stenosis, dissection,  or occlusion. Mild tortuosity of the proximal right ICA. Left carotid system: Left common carotid artery patent from its origin to the bifurcation. No significant atheromatous plaque about the left carotid bifurcation. Left ICA patent from the bifurcation to the skullbase without stenosis, dissection, or vascular occlusion. Tortuosity the proximal left ICA. Vertebral arteries:Both of the vertebral arteries arise from the subclavian arteries. Left vertebral artery is dominant. Left vertebral artery widely patent to the skullbase without stenosis, dissection, or occlusion. The diminutive right vertebral artery is grossly patent, but somewhat poorly evaluated on this exam due to size and time with contrast bolus. Skeleton: Moderate  degenerative spondylolysis at C4-5 and C5-6. Degenerative changes about the C1-2 articulation. No acute osseous abnormality. Other neck: Layering right pleural effusion partially visualized. Probable smaller left pleural effusion. Scattered patchy opacity within the partially visualized left lung, which may reflect acute infectious pneumonitis. Minimal scattered patchy opacity within the right lung as well. Visualized superior mediastinum within normal limits. No acute soft tissue abnormality within the neck. No adenopathy. CTA HEAD Anterior circulation: Petrous, cavernous, and supraclinoid segments patent without flow-limiting stenosis. A1 segments, anterior communicating artery, and anterior cerebral arteries patent. M1 segments patent without stenosis or occlusion. MCA bifurcations normal. No proximal M2 branch occlusion appreciated. Distal MCA branches not well evaluated on this exam due to timing of contrast bolus. Dx Posterior circulation: Vertebral arteries patent to the vertebrobasilar junction. Left vertebral artery dominant. The diminutive right V4 segment not well visualized, and may terminate and high calf. Posterior inferior cerebral arteries not well evaluated on this exam. Basilar artery widely patent to its distal aspect. Superior cerebellar arteries patent bilaterally. Both the posterior cerebral arteries arise from the basilar artery and are well opacified to their distal aspects. Venous sinuses: Not well evaluated on this exam. Anatomic variants: None.  No aneurysm. Delayed phase: No definite abnormal enhancement, although examination fairly limited due to timing of the contrast bolus. IMPRESSION: 1. Negative CTA of the head and neck. No large or proximal arterial branch occlusion. No high-grade or correctable stenosis. 2. Layering bilateral pleural effusions, right greater than left. 3. Multi focal patchy airspace opacities within the partially visualized lungs, nonspecific, but may reflect multi  focal infection. Critical Value/emergent results were called by telephone at the time of interpretation on 12/08/2015 at 11:11 pm to Dr. Norman Clay , who verbally acknowledged these results. Electronically Signed   By: Jeannine Boga M.D.   On: 12/08/2015 23:23   Ct Head Wo Contrast  12/08/2015  CLINICAL DATA:  Initial evaluation for acute right-sided weakness. EXAM: CT HEAD WITHOUT CONTRAST TECHNIQUE: Contiguous axial images were obtained from the base of the skull through the vertex without intravenous contrast. COMPARISON:  None available. FINDINGS: Cerebral volume within normal limits. Mild chronic small vessel ischemic disease within the periventricular white matter. No acute large vessel territory infarct. No intracranial hemorrhage. No mass lesion, midline shift, or mass effect. No hydrocephalus. No extra-axial fluid collection. Scalp soft tissues within normal limits. No acute abnormality about the orbits. Minimal opacity within the left sphenoid sinus. Paranasal sinuses are otherwise clear. No mastoid effusion. Calvarium intact. IMPRESSION: 1. No acute intracranial process. 2. Mild chronic small vessel ischemic disease. Please note that this study only became available for review at approximately 10:40 p.m. on 12/08/2015. Electronically Signed   By: Jeannine Boga M.D.   On: 12/08/2015 22:52   Ct Angio Neck W/cm &/or Wo/cm  12/08/2015  CLINICAL DATA:  Initial evaluation for acute right-sided weakness. EXAM: CT  ANGIOGRAPHY HEAD AND NECK TECHNIQUE: Multidetector CT imaging of the head and neck was performed using the standard protocol during bolus administration of intravenous contrast. Multiplanar CT image reconstructions and MIPs were obtained to evaluate the vascular anatomy. Carotid stenosis measurements (when applicable) are obtained utilizing NASCET criteria, using the distal internal carotid diameter as the denominator. CONTRAST:  50 cc of Isovue 370. COMPARISON:  Prior head CT  from earlier the same day. FINDINGS: CTA NECK Aortic arch:  Study limited by timing of the contrast bolus. Visualized aortic arch of normal caliber with normal branch pattern. No high-grade stenosis at the origin of the great vessels. Visualized subclavian arteries patent. Right carotid system: The right common carotid artery patent from its origin to the bifurcation. No significant atheromatous plaque about the right carotid bifurcation. Right ICA patent from the bifurcation to the skullbase without stenosis, dissection, or occlusion. Mild tortuosity of the proximal right ICA. Left carotid system: Left common carotid artery patent from its origin to the bifurcation. No significant atheromatous plaque about the left carotid bifurcation. Left ICA patent from the bifurcation to the skullbase without stenosis, dissection, or vascular occlusion. Tortuosity the proximal left ICA. Vertebral arteries:Both of the vertebral arteries arise from the subclavian arteries. Left vertebral artery is dominant. Left vertebral artery widely patent to the skullbase without stenosis, dissection, or occlusion. The diminutive right vertebral artery is grossly patent, but somewhat poorly evaluated on this exam due to size and time with contrast bolus. Skeleton: Moderate degenerative spondylolysis at C4-5 and C5-6. Degenerative changes about the C1-2 articulation. No acute osseous abnormality. Other neck: Layering right pleural effusion partially visualized. Probable smaller left pleural effusion. Scattered patchy opacity within the partially visualized left lung, which may reflect acute infectious pneumonitis. Minimal scattered patchy opacity within the right lung as well. Visualized superior mediastinum within normal limits. No acute soft tissue abnormality within the neck. No adenopathy. CTA HEAD Anterior circulation: Petrous, cavernous, and supraclinoid segments patent without flow-limiting stenosis. A1 segments, anterior communicating  artery, and anterior cerebral arteries patent. M1 segments patent without stenosis or occlusion. MCA bifurcations normal. No proximal M2 branch occlusion appreciated. Distal MCA branches not well evaluated on this exam due to timing of contrast bolus. Dx Posterior circulation: Vertebral arteries patent to the vertebrobasilar junction. Left vertebral artery dominant. The diminutive right V4 segment not well visualized, and may terminate and high calf. Posterior inferior cerebral arteries not well evaluated on this exam. Basilar artery widely patent to its distal aspect. Superior cerebellar arteries patent bilaterally. Both the posterior cerebral arteries arise from the basilar artery and are well opacified to their distal aspects. Venous sinuses: Not well evaluated on this exam. Anatomic variants: None.  No aneurysm. Delayed phase: No definite abnormal enhancement, although examination fairly limited due to timing of the contrast bolus. IMPRESSION: 1. Negative CTA of the head and neck. No large or proximal arterial branch occlusion. No high-grade or correctable stenosis. 2. Layering bilateral pleural effusions, right greater than left. 3. Multi focal patchy airspace opacities within the partially visualized lungs, nonspecific, but may reflect multi focal infection. Critical Value/emergent results were called by telephone at the time of interpretation on 12/08/2015 at 11:11 pm to Dr. Norman Clay , who verbally acknowledged these results. Electronically Signed   By: Jeannine Boga M.D.   On: 12/08/2015 23:23   Mr Brain Wo Contrast  12/09/2015  CLINICAL DATA:  Initial evaluation for acute right-sided weakness. EXAM: MRI HEAD WITHOUT CONTRAST TECHNIQUE: Multiplanar, multiecho pulse sequences of the brain  and surrounding structures were obtained without intravenous contrast. COMPARISON:  Prior CTA from 12/08/2015. FINDINGS: Study mildly degraded by motion artifact. Mild diffuse prominence of the CSF  containing spaces compatible with generalized cerebral atrophy. Mild chronic small vessel ischemic type changes present within the periventricular white matter, within normal limits for patient age. No abnormal foci of restricted diffusion to suggest acute infarct. Gray-white matter defect a shin maintained. Major intracranial vascular flow voids are preserved. No acute or chronic intracranial hemorrhage. No areas of chronic infarction. No mass lesion, midline shift, or mass effect. No hydrocephalus. No extra-axial fluid collection. Major dural sinuses are patent. Craniocervical junction within normal limits. Visualized upper cervical spine unremarkable. Pituitary gland normal. No acute abnormality about the orbits. Sequela prior bilateral lens extraction noted. Mild scattered mucosal thickening within the ethmoidal air cells. Paranasal sinuses are otherwise clear. No mastoid effusion. Ear structures grossly normal. Bone marrow signal intensity within normal limits. No scalp soft tissue abnormality. IMPRESSION: Negative brain MRI. No acute intracranial infarct or other abnormality identified. Electronically Signed   By: Jeannine Boga M.D.   On: 12/09/2015 05:40   Dg Chest Port 1 View  12/09/2015  CLINICAL DATA:  Leukocytosis.  Cough and fever EXAM: PORTABLE CHEST 1 VIEW COMPARISON:  12/05/2015 FINDINGS: Progressive consolidation in the right lower lobe. This may be due to atelectasis and/or pneumonia. There is an element of volume loss. Left lung remains clear without infiltrate or effusion. Negative for heart failure. IMPRESSION: Progression of right lower lobe consolidation. Electronically Signed   By: Franchot Gallo M.D.   On: 12/09/2015 08:01    Scheduled Meds: . acidophilus  1 capsule Oral Daily  . enoxaparin (LOVENOX) injection  70 mg Subcutaneous Q24H  . fluticasone  2 spray Each Nare Daily  . furosemide  80 mg Intravenous 3 times per day  . gabapentin  300 mg Oral BID  . hydrALAZINE  25 mg  Oral 3 times per day  . insulin aspart  0-15 Units Subcutaneous TID WC  . insulin glargine  15 Units Subcutaneous Daily  . ipratropium-albuterol  3 mL Nebulization TID  . isosorbide mononitrate  30 mg Oral Daily  . levothyroxine  175 mcg Oral QAC breakfast  . magic mouthwash  5 mL Oral QID  .  morphine injection  1 mg Intravenous Once  . oseltamivir  75 mg Oral BID  . oxybutynin  10 mg Oral BID  . pantoprazole  40 mg Oral BID  . PARoxetine  40 mg Oral Daily  . polyethylene glycol  17 g Oral BID  . predniSONE  50 mg Oral BID WC  . senna-docusate  1 tablet Oral BID   Continuous Infusions:     Time spent: 35 minutes    Lendell Gallick, [place]  [facility] Pager [phone] If 7PM-7AM, please contact night-coverage at [email or web address], password [facility] 12/09/2015, 8:56 AM  LOS: 10 days

## 2015-12-09 NOTE — Progress Notes (Signed)
Pt eating at this time, states she will do her flutter after she finishes for her chest PT. RT will follow up.

## 2015-12-09 NOTE — Progress Notes (Signed)
Name: Gina Bender MRN: [id number] DOB: December 25, 1950    ADMISSION DATE:  11/28/2015 CONSULTATION DATE:  12/07/15  REFERRING MD :  Posey Pronto  CHIEF COMPLAINT:  SOB   HISTORY OF PRESENT ILLNESS:  Gina Bender is a 65 y.o. female with a PMH as outlined below.  She was admitted 12/07/15 with asthma exacerbation.  She was started on steroids, BD's, abx.  During her admission, she was tested for flu for which she was positive (influenza A by flu PCR).  She had gradual improvement but as of 12/07/15, continued to have some SOB, mainly with exertion, along with productive cough.  PCCM was called for further recs.  She denies any fevers/chills/sweats, chest pain, N/V/D, abd pain, myalgias.  She has OSA / OHS for which she is on nocturnal CPAP.  She states she has one at home but does not use it as prescribed because she does not like the way that it fits.  She has been using the one here in the hospital and likes it much better.  She has asked if we can change her mask at home so that she can hopefully be compliant each night.   SUBJECTIVE:  Concern for acute CVA overnight given sudden onset stroke symptoms.  All imaging including MRI returned negative.  UA also negative. She does complain of chest congestion and cough but unable to cough much sputum up.  Does have some sputum production, but feels she needs to cough up more.  VITAL SIGNS: Temp:  [97.5 F (36.4 C)-98.4 F (36.9 C)] 98.4 F (36.9 C) (04/05 0500) Pulse Rate:  [72-77] 74 (04/05 0500) Resp:  [18] 18 (04/04 1519) BP: (90-127)/(40-56) 108/56 mmHg (04/05 0800) SpO2:  [93 %-96 %] 95 % (04/05 0919)  PHYSICAL EXAMINATION: General: Adult female, obese, resting in bed eating breakfast, in NAD. Neuro: A&O x 3, non-focal.  HEENT: [place]/AT. PERRL, sclerae anicteric. Cardiovascular: RRR, no M/R/G.  Lungs: Respirations even and unlabored.  Faint wheeze, mainly upper airway. Abdomen: Obese, BS x 4, soft, NT/ND.  Musculoskeletal: No gross  deformities, 1+ edema.  Skin: Intact, warm, no rashes.     Recent Labs Lab 12/08/15 0825 12/09/15 12/09/15 0555  NA 134* 130* 131*  K 3.6 3.9 5.4*  CL 95* 92* 92*  CO2 29 28 27   BUN 37* 37* 37*  CREATININE 1.07* 1.05* 0.89  GLUCOSE 240* 221* 163*    Recent Labs Lab 12/07/15 0425 12/08/15 0825 12/09/15  HGB 9.9* 10.1* 10.2*  HCT 29.7* 30.7* 30.0*  WBC 8.1 9.4 13.2*  PLT 139* 152 118*   Ct Angio Head W/cm &/or Wo Cm  12/08/2015  CLINICAL DATA:  Initial evaluation for acute right-sided weakness. EXAM: CT ANGIOGRAPHY HEAD AND NECK TECHNIQUE: Multidetector CT imaging of the head and neck was performed using the standard protocol during bolus administration of intravenous contrast. Multiplanar CT image reconstructions and MIPs were obtained to evaluate the vascular anatomy. Carotid stenosis measurements (when applicable) are obtained utilizing NASCET criteria, using the distal internal carotid diameter as the denominator. CONTRAST:  50 cc of Isovue 370. COMPARISON:  Prior head CT from earlier the same day. FINDINGS: CTA NECK Aortic arch:  Study limited by timing of the contrast bolus. Visualized aortic arch of normal caliber with normal branch pattern. No high-grade stenosis at the origin of the great vessels. Visualized subclavian arteries patent. Right carotid system: The right common carotid artery patent from its origin to the bifurcation. No significant atheromatous plaque about the right carotid bifurcation. Right  ICA patent from the bifurcation to the skullbase without stenosis, dissection, or occlusion. Mild tortuosity of the proximal right ICA. Left carotid system: Left common carotid artery patent from its origin to the bifurcation. No significant atheromatous plaque about the left carotid bifurcation. Left ICA patent from the bifurcation to the skullbase without stenosis, dissection, or vascular occlusion. Tortuosity the proximal left ICA. Vertebral arteries:Both of the vertebral  arteries arise from the subclavian arteries. Left vertebral artery is dominant. Left vertebral artery widely patent to the skullbase without stenosis, dissection, or occlusion. The diminutive right vertebral artery is grossly patent, but somewhat poorly evaluated on this exam due to size and time with contrast bolus. Skeleton: Moderate degenerative spondylolysis at C4-5 and C5-6. Degenerative changes about the C1-2 articulation. No acute osseous abnormality. Other neck: Layering right pleural effusion partially visualized. Probable smaller left pleural effusion. Scattered patchy opacity within the partially visualized left lung, which may reflect acute infectious pneumonitis. Minimal scattered patchy opacity within the right lung as well. Visualized superior mediastinum within normal limits. No acute soft tissue abnormality within the neck. No adenopathy. CTA HEAD Anterior circulation: Petrous, cavernous, and supraclinoid segments patent without flow-limiting stenosis. A1 segments, anterior communicating artery, and anterior cerebral arteries patent. M1 segments patent without stenosis or occlusion. MCA bifurcations normal. No proximal M2 branch occlusion appreciated. Distal MCA branches not well evaluated on this exam due to timing of contrast bolus. Dx Posterior circulation: Vertebral arteries patent to the vertebrobasilar junction. Left vertebral artery dominant. The diminutive right V4 segment not well visualized, and may terminate and high calf. Posterior inferior cerebral arteries not well evaluated on this exam. Basilar artery widely patent to its distal aspect. Superior cerebellar arteries patent bilaterally. Both the posterior cerebral arteries arise from the basilar artery and are well opacified to their distal aspects. Venous sinuses: Not well evaluated on this exam. Anatomic variants: None.  No aneurysm. Delayed phase: No definite abnormal enhancement, although examination fairly limited due to timing of  the contrast bolus. IMPRESSION: 1. Negative CTA of the head and neck. No large or proximal arterial branch occlusion. No high-grade or correctable stenosis. 2. Layering bilateral pleural effusions, right greater than left. 3. Multi focal patchy airspace opacities within the partially visualized lungs, nonspecific, but may reflect multi focal infection. Critical Value/emergent results were called by telephone at the time of interpretation on 12/08/2015 at 11:11 pm to Dr. Norman Clay , who verbally acknowledged these results. Electronically Signed   By: Jeannine Boga M.D.   On: 12/08/2015 23:23   Ct Head Wo Contrast  12/08/2015  CLINICAL DATA:  Initial evaluation for acute right-sided weakness. EXAM: CT HEAD WITHOUT CONTRAST TECHNIQUE: Contiguous axial images were obtained from the base of the skull through the vertex without intravenous contrast. COMPARISON:  None available. FINDINGS: Cerebral volume within normal limits. Mild chronic small vessel ischemic disease within the periventricular white matter. No acute large vessel territory infarct. No intracranial hemorrhage. No mass lesion, midline shift, or mass effect. No hydrocephalus. No extra-axial fluid collection. Scalp soft tissues within normal limits. No acute abnormality about the orbits. Minimal opacity within the left sphenoid sinus. Paranasal sinuses are otherwise clear. No mastoid effusion. Calvarium intact. IMPRESSION: 1. No acute intracranial process. 2. Mild chronic small vessel ischemic disease. Please note that this study only became available for review at approximately 10:40 p.m. on 12/08/2015. Electronically Signed   By: Jeannine Boga M.D.   On: 12/08/2015 22:52   Ct Angio Neck W/cm &/or Wo/cm  12/08/2015  CLINICAL DATA:  Initial evaluation for acute right-sided weakness. EXAM: CT ANGIOGRAPHY HEAD AND NECK TECHNIQUE: Multidetector CT imaging of the head and neck was performed using the standard protocol during bolus  administration of intravenous contrast. Multiplanar CT image reconstructions and MIPs were obtained to evaluate the vascular anatomy. Carotid stenosis measurements (when applicable) are obtained utilizing NASCET criteria, using the distal internal carotid diameter as the denominator. CONTRAST:  50 cc of Isovue 370. COMPARISON:  Prior head CT from earlier the same day. FINDINGS: CTA NECK Aortic arch:  Study limited by timing of the contrast bolus. Visualized aortic arch of normal caliber with normal branch pattern. No high-grade stenosis at the origin of the great vessels. Visualized subclavian arteries patent. Right carotid system: The right common carotid artery patent from its origin to the bifurcation. No significant atheromatous plaque about the right carotid bifurcation. Right ICA patent from the bifurcation to the skullbase without stenosis, dissection, or occlusion. Mild tortuosity of the proximal right ICA. Left carotid system: Left common carotid artery patent from its origin to the bifurcation. No significant atheromatous plaque about the left carotid bifurcation. Left ICA patent from the bifurcation to the skullbase without stenosis, dissection, or vascular occlusion. Tortuosity the proximal left ICA. Vertebral arteries:Both of the vertebral arteries arise from the subclavian arteries. Left vertebral artery is dominant. Left vertebral artery widely patent to the skullbase without stenosis, dissection, or occlusion. The diminutive right vertebral artery is grossly patent, but somewhat poorly evaluated on this exam due to size and time with contrast bolus. Skeleton: Moderate degenerative spondylolysis at C4-5 and C5-6. Degenerative changes about the C1-2 articulation. No acute osseous abnormality. Other neck: Layering right pleural effusion partially visualized. Probable smaller left pleural effusion. Scattered patchy opacity within the partially visualized left lung, which may reflect acute infectious  pneumonitis. Minimal scattered patchy opacity within the right lung as well. Visualized superior mediastinum within normal limits. No acute soft tissue abnormality within the neck. No adenopathy. CTA HEAD Anterior circulation: Petrous, cavernous, and supraclinoid segments patent without flow-limiting stenosis. A1 segments, anterior communicating artery, and anterior cerebral arteries patent. M1 segments patent without stenosis or occlusion. MCA bifurcations normal. No proximal M2 branch occlusion appreciated. Distal MCA branches not well evaluated on this exam due to timing of contrast bolus. Dx Posterior circulation: Vertebral arteries patent to the vertebrobasilar junction. Left vertebral artery dominant. The diminutive right V4 segment not well visualized, and may terminate and high calf. Posterior inferior cerebral arteries not well evaluated on this exam. Basilar artery widely patent to its distal aspect. Superior cerebellar arteries patent bilaterally. Both the posterior cerebral arteries arise from the basilar artery and are well opacified to their distal aspects. Venous sinuses: Not well evaluated on this exam. Anatomic variants: None.  No aneurysm. Delayed phase: No definite abnormal enhancement, although examination fairly limited due to timing of the contrast bolus. IMPRESSION: 1. Negative CTA of the head and neck. No large or proximal arterial branch occlusion. No high-grade or correctable stenosis. 2. Layering bilateral pleural effusions, right greater than left. 3. Multi focal patchy airspace opacities within the partially visualized lungs, nonspecific, but may reflect multi focal infection. Critical Value/emergent results were called by telephone at the time of interpretation on 12/08/2015 at 11:11 pm to Dr. Norman Clay , who verbally acknowledged these results. Electronically Signed   By: Jeannine Boga M.D.   On: 12/08/2015 23:23   Mr Brain Wo Contrast  12/09/2015  CLINICAL DATA:   Initial evaluation for acute right-sided weakness. EXAM:  MRI HEAD WITHOUT CONTRAST TECHNIQUE: Multiplanar, multiecho pulse sequences of the brain and surrounding structures were obtained without intravenous contrast. COMPARISON:  Prior CTA from 12/08/2015. FINDINGS: Study mildly degraded by motion artifact. Mild diffuse prominence of the CSF containing spaces compatible with generalized cerebral atrophy. Mild chronic small vessel ischemic type changes present within the periventricular white matter, within normal limits for patient age. No abnormal foci of restricted diffusion to suggest acute infarct. Gray-white matter defect a shin maintained. Major intracranial vascular flow voids are preserved. No acute or chronic intracranial hemorrhage. No areas of chronic infarction. No mass lesion, midline shift, or mass effect. No hydrocephalus. No extra-axial fluid collection. Major dural sinuses are patent. Craniocervical junction within normal limits. Visualized upper cervical spine unremarkable. Pituitary gland normal. No acute abnormality about the orbits. Sequela prior bilateral lens extraction noted. Mild scattered mucosal thickening within the ethmoidal air cells. Paranasal sinuses are otherwise clear. No mastoid effusion. Ear structures grossly normal. Bone marrow signal intensity within normal limits. No scalp soft tissue abnormality. IMPRESSION: Negative brain MRI. No acute intracranial infarct or other abnormality identified. Electronically Signed   By: Jeannine Boga M.D.   On: 12/09/2015 05:40   Dg Chest Port 1 View  12/09/2015  CLINICAL DATA:  Leukocytosis.  Cough and fever EXAM: PORTABLE CHEST 1 VIEW COMPARISON:  12/05/2015 FINDINGS: Progressive consolidation in the right lower lobe. This may be due to atelectasis and/or pneumonia. There is an element of volume loss. Left lung remains clear without infiltrate or effusion. Negative for heart failure. IMPRESSION: Progression of right lower lobe  consolidation. Electronically Signed   By: Franchot Gallo M.D.   On: 12/09/2015 08:01    STUDIES:  CXR 04/01 > atx. CXR 04/05 > progression of RLL atx. CT head 04/05 > neg. CTA head / neck 04/05 > neg for acute infarct.  B/l pleural effusions, R>L. MRI brain 04/05 > neg.  SIGNIFICANT EVENTS  03/25 > admit 04/03 > PCCM consult. 04/05 > code stroke called > cancelled later as all imaging negative.  ASSESSMENT / PLAN:  Acute hypoxic respiratory failure - due to influenza A + asthma exacerbation. Asthma exacerbation. OSA / OHS - on nocturnal CPAP but noncompliant as outpatient.  Asking if she can have a different mask. Cough - has increased chest congestion and feels she cant cough much sputum up. Atelectasis. Pleural effusions, R > L. 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  Plan: Continue supplemental O2 as needed to maintain SpO2 > 92%. Recommend ambulatory desat study prior to discharge to assess for home O2 needs (ordered). Continue tamiflu, BD's, steroids (wean). Continue nocturnal CPAP - Will ask case management to assist in obtaining different mask for home. Would have pt follow up with pulmonary as outpatient and obtain PFT's at that time. Lasix added by primary team - agree. Add mucinex, chest PT. Continue PPI, flonase, flutter valve, incentive spirometry. Should probably continue PPI and allergy regimen as an outpt [facility] as able. CXR in AM.   Montey Hora, PA - C [facility] Pager: [phone]  or [phone] 12/09/2015, 10:51 AM   Attending Note:  I have examined patient, reviewed labs, studies and notes. I have discussed the case with [place], and I agree with the data and plans as amended above.   Baltazar Apo, MD, PhD 12/09/2015, 2:51 PM [facility] [phone] or if no answer [phone]

## 2015-12-09 NOTE — Progress Notes (Signed)
Pharmacy Antibiotic Note  Gina Bender is a 65 y.o. female admitted on 11/28/2015 with pneumonia.  Pharmacy has been consulted for unasyn dosing.  Plan: Unasyn 3gm IV q6h  Monitor culture data, renal function and clinical course  Height: 5\' 6"  (167.6 cm) Weight: (!) 306 lb 8 oz (139.027 kg) IBW/kg (Calculated) : 59.3  Temp (24hrs), Avg:97.9 F (36.6 C), Min:97.5 F (36.4 C), Max:98.4 F (36.9 C)   Recent Labs Lab 12/04/15 0700 12/05/15 0520 12/07/15 0425 12/08/15 0825 12/09/15 12/09/15 0555  WBC 8.1 7.1 8.1 9.4 13.2*  --   CREATININE 0.72 0.91 1.09* 1.07* 1.05* 0.89    Estimated Creatinine Clearance: 89.5 mL/min (by C-G formula based on Cr of 0.89).    Allergies  Allergen Reactions  . Hydrocodone-Acetaminophen     REACTION: nausea and vomiting  . Latex Dermatitis  . Oxycodone-Acetaminophen     REACTION: hives, nausea and vomiting  . Pollen Extract   . Succinylcholine     REACTION: heart stopped    Antimicrobials this admission: CTX 3/26 >> 3/29 Doxy 3/26 >> 3/27 Flagyl 3/27 >> 3/29 Tamiflu 3/31 >> Unasyn 4/5 >>   Dose adjustments this admission: n/a  Microbiology results:  BCx:  3/25 UCx: >100k citrobacter sens to CTX, cipro, bactrim   Sputum:    MRSA PCR:    Andrey Cota. Diona Foley, PharmD, BCPS Clinical Pharmacist Pager [phone]  12/09/2015 10:43 AM
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
## 2015-12-10 ENCOUNTER — Inpatient Hospital Stay (HOSPITAL_COMMUNITY): Payer: Medicare Other

## 2015-12-10 DIAGNOSIS — I4581 Long QT syndrome: Secondary | ICD-10-CM

## 2015-12-10 LAB — RENAL FUNCTION PANEL
ANION GAP: 15 (ref 5–15)
Albumin: 2.1 g/dL — ABNORMAL LOW (ref 3.5–5.0)
BUN: 31 mg/dL — ABNORMAL HIGH (ref 6–20)
CHLORIDE: 89 mmol/L — AB (ref 101–111)
CO2: 30 mmol/L (ref 22–32)
Calcium: 7.7 mg/dL — ABNORMAL LOW (ref 8.9–10.3)
Creatinine, Ser: 1 mg/dL (ref 0.44–1.00)
GFR, EST NON AFRICAN AMERICAN: 57 mL/min — AB (ref >60)
Glucose, Bld: 162 mg/dL — ABNORMAL HIGH (ref 65–99)
POTASSIUM: 3.3 mmol/L — AB (ref 3.5–5.1)
Phosphorus: 3.5 mg/dL (ref 2.5–4.6)
Sodium: 134 mmol/L — ABNORMAL LOW (ref 135–145)

## 2015-12-10 LAB — GLUCOSE, CAPILLARY
GLUCOSE-CAPILLARY: 171 mg/dL — AB (ref 65–99)
GLUCOSE-CAPILLARY: 191 mg/dL — AB (ref 65–99)
GLUCOSE-CAPILLARY: 136 mg/dL — AB (ref 65–99)
Glucose-Capillary: 168 mg/dL — ABNORMAL HIGH (ref 65–99)

## 2015-12-10 LAB — MAGNESIUM: MAGNESIUM: 1.6 mg/dL — AB (ref 1.7–2.4)

## 2015-12-10 MED ORDER — PREDNISONE 20 MG PO TABS
50.0000 mg | ORAL_TABLET | Freq: Every day | ORAL | Status: DC
  Administered 2015-12-11 – 2015-12-15 (×5): 50 mg via ORAL
  Filled 2015-12-10 (×5): qty 2

## 2015-12-10 MED ORDER — MAGNESIUM SULFATE 4 GM/100ML IV SOLN
4.0000 g | Freq: Once | INTRAVENOUS | Status: AC
  Administered 2015-12-10: 4 g via INTRAVENOUS
  Filled 2015-12-10: qty 100

## 2015-12-10 MED ORDER — NADOLOL 20 MG PO TABS
20.0000 mg | ORAL_TABLET | Freq: Every day | ORAL | Status: DC
  Administered 2015-12-10 – 2015-12-15 (×6): 20 mg via ORAL
  Filled 2015-12-10 (×6): qty 1

## 2015-12-10 MED ORDER — POTASSIUM CHLORIDE CRYS ER 20 MEQ PO TBCR
40.0000 meq | EXTENDED_RELEASE_TABLET | Freq: Once | ORAL | Status: AC
  Administered 2015-12-10: 40 meq via ORAL
  Filled 2015-12-10: qty 2

## 2015-12-10 NOTE — Progress Notes (Signed)
[facility] PROGRESS NOTE  Gina Bender [id number] DOB: 08-01-1951 DOA: 11/28/2015 PCP: Nyoka Cowden, MD  Brief narrative 65 year old morbidly obese female with history of vocal cord dysfunction, asthma, 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 Hospital course was prolonged due to acute kidney injury, positive influenza A and acute on chronic diastolic CHF. Patient being treated for all these. Pulmonary following given her persistent respiratory symptoms. Overnight on 4/4-4/5 and had acute onset of right-sided weakness with slow speech. Code stroke was called. Did not receive TPA as onset of symptoms was not clear. Head CT and MRI brain was unremarkable. Chest x-ray showed worsened right lower lobe consolidation.  Assessment/Plan: Acute hypoxic respiratory failure Combination of asthma exacerbation, influenza A, acute on chronic diastolic CHF and possible aspiration pneumonia. -Still requiring 2 L O2 via nasal cannula. congestion slightly improved. Lasix dose increased to 80 mg 3 times a day with good diuresis. Completed Tamiflu.. Continue DuoNeb. Tapering prednisone. -Continue Mucinex and chest PT. Continue Flonase, flutter valve and incentive spirometry.  -Seen by his speech and swallow and no signs or symptoms of aspiration. -Appreciate pulmonary follow-up. Needs to check for home O2 requirement prior to discharge and outpatient PFTs.  Acute right-sided weakness on 4/5 Code Stroke called. Imaging (CT angiogram head and neck and MRI brain unremarkable). No residual weakness but still has slow speech. No clear etiology. Chest x-ray with worsened right lower lobe infiltrate,  suspicion for aspiration. Also on quite high dose of gabapentin which might be causing speech symptoms. Dose  reduced by neurology. Appears around baseline today.   Acute on chronic diastolic CHF Increase IV Lasix to 80 mg 3 times a day. Monitor strict I/O and daily weight. It is quite well past 24 hours with negative balance of 3L since admission.  Foley catheter for strict I/O monitoring. 2-D echo shows normal EF with grade 1 diastolic dysfunction.  ? Aspiration pneumonia Worsened right lower lobe consolidation seen on chest x-ray. Added empiric Unasyn.  Has vocal cord dysfunction as well. Added florostor on recent history of C. difficile..  Influenza A Completed Tamiflu on 4/5  Obesity hypoventilation syndrome/OSA Continue nighttime CPAP.  UTI Recent history of C. difficile. Received 3 days of IV Rocephin this admission.  Hypokalemia/hypomagnesemia Replenished.  Prolonged QTC >600 . Replenish low potassium and magnesium. discontinue Paxil and Ditropan. i will add low dose coreg. Will ask cardiology to evaluate.   diabetes mellitus type 2 Daily Lantus with sliding scale coverage. Well controlled as outpatient (A1c of 6.1) . Neurontin for peripheral neuropathy.  Essential hypertension Continue BiDil. atenolol was discontinued due to due to sinus bradycardia on 3/30. Not sure how low the ate was. i have added low dose metoprolol for rate control given her prolonged QTc  Hypothyroidism Continue Synthroid  Morbid obesity   DVT Prophylaxis: Subcutaneous Lovenox  Diet diabetic/ Heart healthy  Code Status: Full code Family Communication: husband at bedside Disposition Plan: Pending clinical improvement. PT recommend skilled nursing facility.   Consultants:  Pulmonary  Procedures:  2-D echo  Antibiotics:  Completed 2 days of IV Rocephin  IV Unasyn since 4/5  HPI/Subjective: Seen and examined. Further neurological symptoms. Still has cough and congestion , which she reports to be only slightly better. Objective: Filed Vitals:   12/10/15 0021 12/10/15 0500  BP:   133/74  Pulse: 86 87  Temp:  99.5 F (37.5 C)  Resp: 18     Intake/Output Summary (Last 24 hours) at 12/10/15 1112 Last data filed at 12/10/15 0500  Gross per 24 hour  Intake    240 ml  Output   4300 ml  Net  -4060 ml   Filed Weights   12/05/15 0500 12/07/15 1000 12/10/15 0500  Weight: 138.982 kg (306 lb 6.4 oz) 139.027 kg (306 lb 8 oz) 151.955 kg (335 lb)    Exam:   General:  Obese,fatigued  HEENT: Appears less congested no pallor, moist mucosa  Cardiovascular: NS1&S2, no Murmurs  Respiratory: coarse breath sounds b/l (better from yesterday)  Abdomen: soft, NT, ND, BS+, foley+  Musculoskeletal: 1+ pitting edema b/l  CNS: AAOX3, nonfocal  Data Reviewed: Basic Metabolic Panel:  Recent Labs Lab 12/05/15 0520 12/07/15 0425 12/08/15 0825 12/09/15 12/09/15 0555 12/10/15 0701  NA 137 136 134* 130* 131* 134*  K 3.7 4.3 3.6 3.9 5.4* 3.3*  CL 100* 99* 95* 92* 92* 89*  CO2 31 26 29 28 27 30   GLUCOSE 146* 198* 240* 221* 163* 162*  BUN 18 35* 37* 37* 37* 31*  CREATININE 0.91 1.09* 1.07* 1.05* 0.89 1.00  CALCIUM 7.9* 8.0* 8.1* 7.9* 7.6* 7.7*  MG 1.6* 2.0 2.0 1.9  --  1.6*  PHOS  --   --  3.3  --  3.4 3.5   Liver Function Tests:  Recent Labs Lab 12/08/15 0825 12/09/15 12/09/15 0555 12/10/15 0701  AST  --  76*  --   --   ALT  --  55*  --   --   ALKPHOS  --  123  --   --   BILITOT  --  2.8*  --   --   PROT  --  5.6*  --   --   ALBUMIN 1.9* 1.9* 1.8* 2.1*   No results for input(s): LIPASE, AMYLASE in the last 168 hours. No results for input(s): AMMONIA in the last 168 hours. CBC:  Recent Labs Lab 12/04/15 0700 12/05/15 0520 12/07/15 0425 12/08/15 0825 12/09/15  WBC 8.1 7.1 8.1 9.4 13.2*  NEUTROABS  --  5.5  --  7.8* 11.1*  HGB 10.6* 10.3* 9.9* 10.1* 10.2*  HCT 30.6* 31.5* 29.7* 30.7* 30.0*  MCV 86.7 87.5 87.1 87.5 86.7  PLT 131* 159 139* 152 118*   Cardiac Enzymes:  Recent Labs Lab 12/05/15 0118 12/05/15 0520 12/05/15 1148 12/08/15 1318   TROPONINI 0.03 <0.03 0.03 0.03   BNP (last 3 results)  Recent Labs  11/28/15 0204  BNP 256.3*    ProBNP (last 3 results) No results for input(s): PROBNP in the last 8760 hours.  CBG:  Recent Labs Lab 12/09/15 0640 12/09/15 1157 12/09/15 1631 12/09/15 2241 12/10/15 0653  GLUCAP 167* 162* 167* 148* 171*    No results found for this or any previous visit (from the past 240 hour(s)).   Studies: Ct Angio Head W/cm &/or Wo Cm  12/08/2015  CLINICAL DATA:  Initial evaluation for acute right-sided weakness. EXAM: CT ANGIOGRAPHY HEAD AND NECK TECHNIQUE: Multidetector CT imaging of the head and neck was performed using the standard protocol during bolus administration of intravenous contrast. Multiplanar CT image reconstructions and MIPs were obtained to evaluate the vascular anatomy. Carotid stenosis measurements (when applicable) are obtained utilizing NASCET criteria, using the distal internal carotid diameter as the denominator. CONTRAST:  50 cc of Isovue 370. COMPARISON:  Prior head CT from earlier the same day. FINDINGS: CTA NECK Aortic arch:  Study limited by timing of the contrast bolus. Visualized aortic arch of normal caliber with normal branch pattern. No high-grade stenosis at the origin of the great vessels. Visualized subclavian arteries patent. Right carotid system: The right common carotid artery patent from its origin to the bifurcation. No significant atheromatous plaque about the right carotid bifurcation. Right ICA patent from the bifurcation to the skullbase without stenosis, dissection, or occlusion. Mild tortuosity of the proximal right ICA. Left carotid system: Left common carotid artery patent from its origin to the bifurcation. No significant atheromatous plaque about the left carotid bifurcation. Left ICA patent from the bifurcation to the skullbase without stenosis, dissection, or vascular occlusion. Tortuosity the proximal left ICA. Vertebral arteries:Both of the  vertebral arteries arise from the subclavian arteries. Left vertebral artery is dominant. Left vertebral artery widely patent to the skullbase without stenosis, dissection, or occlusion. The diminutive right vertebral artery is grossly patent, but somewhat poorly evaluated on this exam due to size and time with contrast bolus. Skeleton: Moderate degenerative spondylolysis at C4-5 and C5-6. Degenerative changes about the C1-2 articulation. No acute osseous abnormality. Other neck: Layering right pleural effusion partially visualized. Probable smaller left pleural effusion. Scattered patchy opacity within the partially visualized left lung, which may reflect acute infectious pneumonitis. Minimal scattered patchy opacity within the right lung as well. Visualized superior mediastinum within normal limits. No acute soft tissue abnormality within the neck. No adenopathy. CTA HEAD Anterior circulation: Petrous, cavernous, and supraclinoid segments patent without flow-limiting stenosis. A1 segments, anterior communicating artery, and anterior cerebral arteries patent. M1 segments patent without stenosis or occlusion. MCA bifurcations normal. No proximal M2 branch occlusion appreciated. Distal MCA branches not well evaluated on this exam due to timing of contrast bolus. Dx Posterior circulation: Vertebral arteries patent to the vertebrobasilar junction. Left vertebral artery dominant. The diminutive right V4 segment not well visualized, and may terminate and high calf. Posterior inferior cerebral arteries not well evaluated on this exam. Basilar artery widely patent to its distal aspect. Superior cerebellar arteries patent bilaterally. Both the posterior cerebral arteries arise from the basilar artery and are well opacified to their distal aspects. Venous sinuses: Not well evaluated on this exam. Anatomic variants: None.  No aneurysm. Delayed phase: No definite abnormal enhancement, although examination fairly limited due to  timing of the contrast bolus. IMPRESSION: 1. Negative CTA of the head and neck. No large or proximal arterial branch occlusion. No high-grade or correctable stenosis. 2. Layering bilateral pleural effusions, right greater than left. 3. Multi focal patchy airspace opacities within the partially visualized lungs, nonspecific, but may reflect multi focal infection. Critical Value/emergent results were called by telephone at the time of interpretation on 12/08/2015 at 11:11 pm to Dr. Norman Clay , who verbally acknowledged these results. Electronically Signed   By: Jeannine Boga M.D.   On: 12/08/2015 23:23   Ct Head Wo Contrast  12/08/2015  CLINICAL DATA:  Initial evaluation for acute right-sided weakness. EXAM: CT HEAD WITHOUT CONTRAST TECHNIQUE: Contiguous axial images were obtained from the base of the skull through the vertex without intravenous contrast. COMPARISON:  None available. FINDINGS: Cerebral volume within normal limits. Mild chronic small vessel ischemic disease within the periventricular white matter. No acute large vessel territory infarct. No intracranial hemorrhage. No mass lesion, midline shift, or mass effect. No hydrocephalus. No extra-axial fluid collection. Scalp soft tissues within normal limits. No acute abnormality about the orbits. Minimal opacity within the left sphenoid sinus. Paranasal sinuses are otherwise clear. No mastoid  effusion. Calvarium intact. IMPRESSION: 1. No acute intracranial process. 2. Mild chronic small vessel ischemic disease. Please note that this study only became available for review at approximately 10:40 p.m. on 12/08/2015. Electronically Signed   By: Jeannine Boga M.D.   On: 12/08/2015 22:52   Ct Angio Neck W/cm &/or Wo/cm  12/08/2015  CLINICAL DATA:  Initial evaluation for acute right-sided weakness. EXAM: CT ANGIOGRAPHY HEAD AND NECK TECHNIQUE: Multidetector CT imaging of the head and neck was performed using the standard protocol during  bolus administration of intravenous contrast. Multiplanar CT image reconstructions and MIPs were obtained to evaluate the vascular anatomy. Carotid stenosis measurements (when applicable) are obtained utilizing NASCET criteria, using the distal internal carotid diameter as the denominator. CONTRAST:  50 cc of Isovue 370. COMPARISON:  Prior head CT from earlier the same day. FINDINGS: CTA NECK Aortic arch:  Study limited by timing of the contrast bolus. Visualized aortic arch of normal caliber with normal branch pattern. No high-grade stenosis at the origin of the great vessels. Visualized subclavian arteries patent. Right carotid system: The right common carotid artery patent from its origin to the bifurcation. No significant atheromatous plaque about the right carotid bifurcation. Right ICA patent from the bifurcation to the skullbase without stenosis, dissection, or occlusion. Mild tortuosity of the proximal right ICA. Left carotid system: Left common carotid artery patent from its origin to the bifurcation. No significant atheromatous plaque about the left carotid bifurcation. Left ICA patent from the bifurcation to the skullbase without stenosis, dissection, or vascular occlusion. Tortuosity the proximal left ICA. Vertebral arteries:Both of the vertebral arteries arise from the subclavian arteries. Left vertebral artery is dominant. Left vertebral artery widely patent to the skullbase without stenosis, dissection, or occlusion. The diminutive right vertebral artery is grossly patent, but somewhat poorly evaluated on this exam due to size and time with contrast bolus. Skeleton: Moderate degenerative spondylolysis at C4-5 and C5-6. Degenerative changes about the C1-2 articulation. No acute osseous abnormality. Other neck: Layering right pleural effusion partially visualized. Probable smaller left pleural effusion. Scattered patchy opacity within the partially visualized left lung, which may reflect acute  infectious pneumonitis. Minimal scattered patchy opacity within the right lung as well. Visualized superior mediastinum within normal limits. No acute soft tissue abnormality within the neck. No adenopathy. CTA HEAD Anterior circulation: Petrous, cavernous, and supraclinoid segments patent without flow-limiting stenosis. A1 segments, anterior communicating artery, and anterior cerebral arteries patent. M1 segments patent without stenosis or occlusion. MCA bifurcations normal. No proximal M2 branch occlusion appreciated. Distal MCA branches not well evaluated on this exam due to timing of contrast bolus. Dx Posterior circulation: Vertebral arteries patent to the vertebrobasilar junction. Left vertebral artery dominant. The diminutive right V4 segment not well visualized, and may terminate and high calf. Posterior inferior cerebral arteries not well evaluated on this exam. Basilar artery widely patent to its distal aspect. Superior cerebellar arteries patent bilaterally. Both the posterior cerebral arteries arise from the basilar artery and are well opacified to their distal aspects. Venous sinuses: Not well evaluated on this exam. Anatomic variants: None.  No aneurysm. Delayed phase: No definite abnormal enhancement, although examination fairly limited due to timing of the contrast bolus. IMPRESSION: 1. Negative CTA of the head and neck. No large or proximal arterial branch occlusion. No high-grade or correctable stenosis. 2. Layering bilateral pleural effusions, right greater than left. 3. Multi focal patchy airspace opacities within the partially visualized lungs, nonspecific, but may reflect multi focal infection. Critical Value/emergent results were  called by telephone at the time of interpretation on 12/08/2015 at 11:11 pm to Dr. Norman Clay , who verbally acknowledged these results. Electronically Signed   By: Jeannine Boga M.D.   On: 12/08/2015 23:23   Mr Brain Wo Contrast  12/09/2015  CLINICAL  DATA:  Initial evaluation for acute right-sided weakness. EXAM: MRI HEAD WITHOUT CONTRAST TECHNIQUE: Multiplanar, multiecho pulse sequences of the brain and surrounding structures were obtained without intravenous contrast. COMPARISON:  Prior CTA from 12/08/2015. FINDINGS: Study mildly degraded by motion artifact. Mild diffuse prominence of the CSF containing spaces compatible with generalized cerebral atrophy. Mild chronic small vessel ischemic type changes present within the periventricular white matter, within normal limits for patient age. No abnormal foci of restricted diffusion to suggest acute infarct. Gray-white matter defect a shin maintained. Major intracranial vascular flow voids are preserved. No acute or chronic intracranial hemorrhage. No areas of chronic infarction. No mass lesion, midline shift, or mass effect. No hydrocephalus. No extra-axial fluid collection. Major dural sinuses are patent. Craniocervical junction within normal limits. Visualized upper cervical spine unremarkable. Pituitary gland normal. No acute abnormality about the orbits. Sequela prior bilateral lens extraction noted. Mild scattered mucosal thickening within the ethmoidal air cells. Paranasal sinuses are otherwise clear. No mastoid effusion. Ear structures grossly normal. Bone marrow signal intensity within normal limits. No scalp soft tissue abnormality. IMPRESSION: Negative brain MRI. No acute intracranial infarct or other abnormality identified. Electronically Signed   By: Jeannine Boga M.D.   On: 12/09/2015 05:40   Dg Chest Port 1 View  12/10/2015  CLINICAL DATA:  Respiratory failure, shortness of breath, cough EXAM: PORTABLE CHEST 1 VIEW COMPARISON:  Portable chest x-ray of 12/09/2015, 12/04/2015, and 11/28/2015 FINDINGS: Opacity remains at the right lung base most consistent with atelectasis and elevation of the right hemidiaphragm. If further assessment is warranted, CT of the chest with IV contrast would be  recommended. The left lung is clear. Heart size is stable. IMPRESSION: No change in opacity at the right lung base most consistent with atelectasis and elevation of the right hemidiaphragm. Electronically Signed   By: Ivar Drape M.D.   On: 12/10/2015 08:15   Dg Chest Port 1 View  12/09/2015  CLINICAL DATA:  Leukocytosis.  Cough and fever EXAM: PORTABLE CHEST 1 VIEW COMPARISON:  12/05/2015 FINDINGS: Progressive consolidation in the right lower lobe. This may be due to atelectasis and/or pneumonia. There is an element of volume loss. Left lung remains clear without infiltrate or effusion. Negative for heart failure. IMPRESSION: Progression of right lower lobe consolidation. Electronically Signed   By: Franchot Gallo M.D.   On: 12/09/2015 08:01    Scheduled Meds: . acidophilus  1 capsule Oral Daily  . ampicillin-sulbactam (UNASYN) IV  3 g Intravenous Q6H  . enoxaparin (LOVENOX) injection  70 mg Subcutaneous Q24H  . fluticasone  2 spray Each Nare Daily  . furosemide  80 mg Intravenous 3 times per day  . gabapentin  200 mg Oral BID  . guaiFENesin  600 mg Oral BID  . hydrALAZINE  25 mg Oral 3 times per day  . insulin aspart  0-15 Units Subcutaneous TID WC  . insulin glargine  15 Units Subcutaneous Daily  . ipratropium-albuterol  3 mL Nebulization TID  . isosorbide mononitrate  30 mg Oral Daily  . levothyroxine  175 mcg Oral QAC breakfast  . magic mouthwash  5 mL Oral QID  .  morphine injection  1 mg Intravenous Once  . oxybutynin  10  mg Oral BID  . pantoprazole  40 mg Oral BID  . PARoxetine  40 mg Oral Daily  . polyethylene glycol  17 g Oral BID  . [START ON 12/11/2015] predniSONE  50 mg Oral Q breakfast  . saccharomyces boulardii  250 mg Oral BID  . senna-docusate  1 tablet Oral BID   Continuous Infusions:     Time spent: 35 minutes    Fountain [place]ush[place], [place]  [facility] Pager [phone] If 7PM-7AM, please contact night-coverage at [email or web address], password [facility] 12/10/2015, 11:12 AM   LOS: 11 days

## 2015-12-10 NOTE — Progress Notes (Signed)
Skin bruised along r and l flanks.skin folds efusing clear serous drainage. Some serosanguinous drainage noted on bed pad. Assessed skin, foley insertion, rectal area, sacral area. No obvious sign of bleeding from anywhere. Skin around anus and perineal area dark reddened. Oob to chair x 3 hours this pm. Placed back in bed, perineal care, bari mattress inflated.

## 2015-12-10 NOTE — Progress Notes (Signed)
Patient stated she is not ready for CPAP at this time. Said she will call when ready. RT will continue to monitor.

## 2015-12-10 NOTE — Progress Notes (Signed)
Name: Gina Bender MRN: [id number] DOB: 1951/06/12    ADMISSION DATE:  11/28/2015 CONSULTATION DATE:  12/07/15  REFERRING MD :  Posey Pronto  CHIEF COMPLAINT:  SOB   HISTORY OF PRESENT ILLNESS:  Gina Bender is a 65 y.o. female with a PMH as outlined below.  She was admitted 12/07/15 with asthma exacerbation.  She was started on steroids, BD's, abx.  During her admission, she was tested for flu for which she was positive (influenza A by flu PCR).  She had gradual improvement but as of 12/07/15, continued to have some SOB, mainly with exertion, along with productive cough.  PCCM was called for further recs.  She has OSA / OHS for which she is on nocturnal CPAP.  She states she has one at home but does not use it as prescribed because she does not like the way that it fits.  She has been using the one here in the hospital and likes it much better.  She has asked if we can change her mask at home so that she can hopefully be compliant each night.   SUBJECTIVE:  Negative neuro w/u thus far including MRI brain, CTA head and neck.  Wore CPAP overnight.  C/o constipation.  Still has cough but bringing sputum up a little better.     VITAL SIGNS: Temp:  [98.1 F (36.7 C)-99.5 F (37.5 C)] 99.5 F (37.5 C) (04/06 0500) Pulse Rate:  [86-95] 87 (04/06 0500) Resp:  [18-20] 18 (04/06 0021) BP: (119-153)/(57-74) 133/74 mmHg (04/06 0500) SpO2:  [92 %-97 %] 97 % (04/06 0906) FiO2 (%):  [32 %] 32 % (04/06 0906) Weight:  [151.955 kg (335 lb)] 151.955 kg (335 lb) (04/06 0500)  PHYSICAL EXAMINATION: General: Adult female, obese, resting in bed, in NAD. Neuro: A&O x 3, non-focal.  HEENT: [place]/AT. PERRL, sclerae anicteric. Cardiovascular: RRR, no M/R/G.  Lungs: Respirations even and unlabored.  Faint exp wheeze, scattered rhonchi, diminished bases.  Abdomen: Obese, BS x 4, soft, NT/ND.  Musculoskeletal: No gross deformities, 1+ edema.  Skin: Intact, warm, no rashes.     Recent Labs Lab 12/09/15  12/09/15 0555 12/10/15 0701  NA 130* 131* 134*  K 3.9 5.4* 3.3*  CL 92* 92* 89*  CO2 28 27 30   BUN 37* 37* 31*  CREATININE 1.05* 0.89 1.00  GLUCOSE 221* 163* 162*    Recent Labs Lab 12/07/15 0425 12/08/15 0825 12/09/15  HGB 9.9* 10.1* 10.2*  HCT 29.7* 30.7* 30.0*  WBC 8.1 9.4 13.2*  PLT 139* 152 118*   Ct Angio Head W/cm &/or Wo Cm  12/08/2015  CLINICAL DATA:  Initial evaluation for acute right-sided weakness. EXAM: CT ANGIOGRAPHY HEAD AND NECK TECHNIQUE: Multidetector CT imaging of the head and neck was performed using the standard protocol during bolus administration of intravenous contrast. Multiplanar CT image reconstructions and MIPs were obtained to evaluate the vascular anatomy. Carotid stenosis measurements (when applicable) are obtained utilizing NASCET criteria, using the distal internal carotid diameter as the denominator. CONTRAST:  50 cc of Isovue 370. COMPARISON:  Prior head CT from earlier the same day. FINDINGS: CTA NECK Aortic arch:  Study limited by timing of the contrast bolus. Visualized aortic arch of normal caliber with normal branch pattern. No high-grade stenosis at the origin of the great vessels. Visualized subclavian arteries patent. Right carotid system: The right common carotid artery patent from its origin to the bifurcation. No significant atheromatous plaque about the right carotid bifurcation. Right ICA patent from the bifurcation to  the skullbase without stenosis, dissection, or occlusion. Mild tortuosity of the proximal right ICA. Left carotid system: Left common carotid artery patent from its origin to the bifurcation. No significant atheromatous plaque about the left carotid bifurcation. Left ICA patent from the bifurcation to the skullbase without stenosis, dissection, or vascular occlusion. Tortuosity the proximal left ICA. Vertebral arteries:Both of the vertebral arteries arise from the subclavian arteries. Left vertebral artery is dominant. Left vertebral  artery widely patent to the skullbase without stenosis, dissection, or occlusion. The diminutive right vertebral artery is grossly patent, but somewhat poorly evaluated on this exam due to size and time with contrast bolus. Skeleton: Moderate degenerative spondylolysis at C4-5 and C5-6. Degenerative changes about the C1-2 articulation. No acute osseous abnormality. Other neck: Layering right pleural effusion partially visualized. Probable smaller left pleural effusion. Scattered patchy opacity within the partially visualized left lung, which may reflect acute infectious pneumonitis. Minimal scattered patchy opacity within the right lung as well. Visualized superior mediastinum within normal limits. No acute soft tissue abnormality within the neck. No adenopathy. CTA HEAD Anterior circulation: Petrous, cavernous, and supraclinoid segments patent without flow-limiting stenosis. A1 segments, anterior communicating artery, and anterior cerebral arteries patent. M1 segments patent without stenosis or occlusion. MCA bifurcations normal. No proximal M2 branch occlusion appreciated. Distal MCA branches not well evaluated on this exam due to timing of contrast bolus. Dx Posterior circulation: Vertebral arteries patent to the vertebrobasilar junction. Left vertebral artery dominant. The diminutive right V4 segment not well visualized, and may terminate and high calf. Posterior inferior cerebral arteries not well evaluated on this exam. Basilar artery widely patent to its distal aspect. Superior cerebellar arteries patent bilaterally. Both the posterior cerebral arteries arise from the basilar artery and are well opacified to their distal aspects. Venous sinuses: Not well evaluated on this exam. Anatomic variants: None.  No aneurysm. Delayed phase: No definite abnormal enhancement, although examination fairly limited due to timing of the contrast bolus. IMPRESSION: 1. Negative CTA of the head and neck. No large or proximal  arterial branch occlusion. No high-grade or correctable stenosis. 2. Layering bilateral pleural effusions, right greater than left. 3. Multi focal patchy airspace opacities within the partially visualized lungs, nonspecific, but may reflect multi focal infection. Critical Value/emergent results were called by telephone at the time of interpretation on 12/08/2015 at 11:11 pm to Dr. Norman Clay , who verbally acknowledged these results. Electronically Signed   By: Jeannine Boga M.D.   On: 12/08/2015 23:23   Ct Head Wo Contrast  12/08/2015  CLINICAL DATA:  Initial evaluation for acute right-sided weakness. EXAM: CT HEAD WITHOUT CONTRAST TECHNIQUE: Contiguous axial images were obtained from the base of the skull through the vertex without intravenous contrast. COMPARISON:  None available. FINDINGS: Cerebral volume within normal limits. Mild chronic small vessel ischemic disease within the periventricular white matter. No acute large vessel territory infarct. No intracranial hemorrhage. No mass lesion, midline shift, or mass effect. No hydrocephalus. No extra-axial fluid collection. Scalp soft tissues within normal limits. No acute abnormality about the orbits. Minimal opacity within the left sphenoid sinus. Paranasal sinuses are otherwise clear. No mastoid effusion. Calvarium intact. IMPRESSION: 1. No acute intracranial process. 2. Mild chronic small vessel ischemic disease. Please note that this study only became available for review at approximately 10:40 p.m. on 12/08/2015. Electronically Signed   By: Jeannine Boga M.D.   On: 12/08/2015 22:52   Ct Angio Neck W/cm &/or Wo/cm  12/08/2015  CLINICAL DATA:  Initial evaluation for  acute right-sided weakness. EXAM: CT ANGIOGRAPHY HEAD AND NECK TECHNIQUE: Multidetector CT imaging of the head and neck was performed using the standard protocol during bolus administration of intravenous contrast. Multiplanar CT image reconstructions and MIPs were obtained  to evaluate the vascular anatomy. Carotid stenosis measurements (when applicable) are obtained utilizing NASCET criteria, using the distal internal carotid diameter as the denominator. CONTRAST:  50 cc of Isovue 370. COMPARISON:  Prior head CT from earlier the same day. FINDINGS: CTA NECK Aortic arch:  Study limited by timing of the contrast bolus. Visualized aortic arch of normal caliber with normal branch pattern. No high-grade stenosis at the origin of the great vessels. Visualized subclavian arteries patent. Right carotid system: The right common carotid artery patent from its origin to the bifurcation. No significant atheromatous plaque about the right carotid bifurcation. Right ICA patent from the bifurcation to the skullbase without stenosis, dissection, or occlusion. Mild tortuosity of the proximal right ICA. Left carotid system: Left common carotid artery patent from its origin to the bifurcation. No significant atheromatous plaque about the left carotid bifurcation. Left ICA patent from the bifurcation to the skullbase without stenosis, dissection, or vascular occlusion. Tortuosity the proximal left ICA. Vertebral arteries:Both of the vertebral arteries arise from the subclavian arteries. Left vertebral artery is dominant. Left vertebral artery widely patent to the skullbase without stenosis, dissection, or occlusion. The diminutive right vertebral artery is grossly patent, but somewhat poorly evaluated on this exam due to size and time with contrast bolus. Skeleton: Moderate degenerative spondylolysis at C4-5 and C5-6. Degenerative changes about the C1-2 articulation. No acute osseous abnormality. Other neck: Layering right pleural effusion partially visualized. Probable smaller left pleural effusion. Scattered patchy opacity within the partially visualized left lung, which may reflect acute infectious pneumonitis. Minimal scattered patchy opacity within the right lung as well. Visualized superior  mediastinum within normal limits. No acute soft tissue abnormality within the neck. No adenopathy. CTA HEAD Anterior circulation: Petrous, cavernous, and supraclinoid segments patent without flow-limiting stenosis. A1 segments, anterior communicating artery, and anterior cerebral arteries patent. M1 segments patent without stenosis or occlusion. MCA bifurcations normal. No proximal M2 branch occlusion appreciated. Distal MCA branches not well evaluated on this exam due to timing of contrast bolus. Dx Posterior circulation: Vertebral arteries patent to the vertebrobasilar junction. Left vertebral artery dominant. The diminutive right V4 segment not well visualized, and may terminate and high calf. Posterior inferior cerebral arteries not well evaluated on this exam. Basilar artery widely patent to its distal aspect. Superior cerebellar arteries patent bilaterally. Both the posterior cerebral arteries arise from the basilar artery and are well opacified to their distal aspects. Venous sinuses: Not well evaluated on this exam. Anatomic variants: None.  No aneurysm. Delayed phase: No definite abnormal enhancement, although examination fairly limited due to timing of the contrast bolus. IMPRESSION: 1. Negative CTA of the head and neck. No large or proximal arterial branch occlusion. No high-grade or correctable stenosis. 2. Layering bilateral pleural effusions, right greater than left. 3. Multi focal patchy airspace opacities within the partially visualized lungs, nonspecific, but may reflect multi focal infection. Critical Value/emergent results were called by telephone at the time of interpretation on 12/08/2015 at 11:11 pm to Dr. Norman Clay , who verbally acknowledged these results. Electronically Signed   By: Jeannine Boga M.D.   On: 12/08/2015 23:23   Mr Brain Wo Contrast  12/09/2015  CLINICAL DATA:  Initial evaluation for acute right-sided weakness. EXAM: MRI HEAD WITHOUT CONTRAST TECHNIQUE:  Multiplanar,  multiecho pulse sequences of the brain and surrounding structures were obtained without intravenous contrast. COMPARISON:  Prior CTA from 12/08/2015. FINDINGS: Study mildly degraded by motion artifact. Mild diffuse prominence of the CSF containing spaces compatible with generalized cerebral atrophy. Mild chronic small vessel ischemic type changes present within the periventricular white matter, within normal limits for patient age. No abnormal foci of restricted diffusion to suggest acute infarct. Gray-white matter defect a shin maintained. Major intracranial vascular flow voids are preserved. No acute or chronic intracranial hemorrhage. No areas of chronic infarction. No mass lesion, midline shift, or mass effect. No hydrocephalus. No extra-axial fluid collection. Major dural sinuses are patent. Craniocervical junction within normal limits. Visualized upper cervical spine unremarkable. Pituitary gland normal. No acute abnormality about the orbits. Sequela prior bilateral lens extraction noted. Mild scattered mucosal thickening within the ethmoidal air cells. Paranasal sinuses are otherwise clear. No mastoid effusion. Ear structures grossly normal. Bone marrow signal intensity within normal limits. No scalp soft tissue abnormality. IMPRESSION: Negative brain MRI. No acute intracranial infarct or other abnormality identified. Electronically Signed   By: Jeannine Boga M.D.   On: 12/09/2015 05:40   Dg Chest Port 1 View  12/10/2015  CLINICAL DATA:  Respiratory failure, shortness of breath, cough EXAM: PORTABLE CHEST 1 VIEW COMPARISON:  Portable chest x-ray of 12/09/2015, 12/04/2015, and 11/28/2015 FINDINGS: Opacity remains at the right lung base most consistent with atelectasis and elevation of the right hemidiaphragm. If further assessment is warranted, CT of the chest with IV contrast would be recommended. The left lung is clear. Heart size is stable. IMPRESSION: No change in opacity at the right  lung base most consistent with atelectasis and elevation of the right hemidiaphragm. Electronically Signed   By: Ivar Drape M.D.   On: 12/10/2015 08:15   Dg Chest Port 1 View  12/09/2015  CLINICAL DATA:  Leukocytosis.  Cough and fever EXAM: PORTABLE CHEST 1 VIEW COMPARISON:  12/05/2015 FINDINGS: Progressive consolidation in the right lower lobe. This may be due to atelectasis and/or pneumonia. There is an element of volume loss. Left lung remains clear without infiltrate or effusion. Negative for heart failure. IMPRESSION: Progression of right lower lobe consolidation. Electronically Signed   By: Franchot Gallo M.D.   On: 12/09/2015 08:01    STUDIES:  CXR 04/01 > atx. CXR 04/05 > progression of RLL atx. CT head 04/05 > neg. CTA head / neck 04/05 > neg for acute infarct.  B/l pleural effusions, R>L. MRI brain 04/05 > neg.  SIGNIFICANT EVENTS  03/25 > admit 04/03 > PCCM consult. 04/05 > code stroke called > cancelled later as all imaging negative.  ASSESSMENT / PLAN:  Acute hypoxic respiratory failure - due to influenza A + asthma exacerbation. Asthma exacerbation. OSA / OHS - on nocturnal CPAP but noncompliant as outpatient.   Cough - has increased chest congestion and feels she cant cough much sputum up. Atelectasis. Pleural effusions, R > L. 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  Plan: Continue supplemental O2 as needed to maintain SpO2 > 92%. Needs ambulatory desat study prior to discharge to assess for home O2 needs (ordered). Continue BD's, steroids -- wean prednisone to 50mg  daily (was 50mg  BID, not on chronic steroids, DM) tamiflu completed  Continue nocturnal CPAP - Will ask case management to assist in obtaining different mask for home. Would have pt follow up with pulmonary as outpatient and obtain PFT's at that time. Lasix added by primary team - agree. Continue pulm hygiene, mucinex, chest PT. Continue PPI, flonase --  would continue PPI and allergy regimen on d/c  Mobilize as able. F/u CXR  intermittently -- will need f/u CXR with outpt pulm f/u     Nickolas Madrid, NP 12/10/2015  9:16 AM Pager: [phone] or [phone]
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
## 2015-12-10 NOTE — Progress Notes (Signed)
Physical Therapy Treatment Patient Details Name: Gina Bender MRN: [id number] DOB: [birth year] Today's Date: 12/10/2015    History of Present Illness 65 y.o. female admitted to [facility] on 11/28/15 for SOB.  Dx with asthma exacerbation.  Pt wtih significant PMHx of back pain, asthma, DM, HTN, fibromyalgia, CA, recurrent UTIs and urinary urgency, morbid obesity, bil knee arthroscopy, and back surgery.  Of note, pt reports recent fall to right side and buttocks with inability to move well at home (only able to go to the bathroom, was not eating) since the fall. Per MD note there is no fracture.      PT Comments    Pt pleasant and willing to work with therapy. Pt had IV leak and RN was notified. SpO2 was 90-92% on 2L O2 before and after ambulation. Pt was SOB throughout session, and paused to take breaks when performing exercises. Pt continues to have decreased strength, ROM, and functional mobility. Pt has decreased endurance with mobility and ambulation. Will continue to follow to address her deficits.  Follow Up Recommendations  SNF     Equipment Recommendations       Recommendations for Other Services       Precautions / Restrictions Precautions Precautions: Fall Precaution Comments: history of recent fall Restrictions Weight Bearing Restrictions: No    Mobility  Bed Mobility Overal bed mobility: Needs Assistance;+2 for physical assistance Bed Mobility: Supine to Sit Rolling: Min assist         General bed mobility comments: assist to raise trunk off bed and to bring RLE across bed. cues for reciprocal scooting and assist with scooting to EOB using bed pad  Transfers Overall transfer level: Needs assistance Equipment used: Rolling walker (2 wheeled) Transfers: Sit to/from Stand Sit to Stand: Min guard;Mod assist         General transfer comment: x2 trials from bed and toilet with 2 attempts from each surface. guard for safety during rise from bed. cues for hand  placement and to lean forward during rise. mod assist with rise during trial from toilet. mod assist with pad for scooting back into chair  Ambulation/Gait   Ambulation Distance (Feet): 12 Feet Assistive device: Rolling walker (2 wheeled) Gait Pattern/deviations: Step-through pattern;Decreased stride length;Trunk flexed   Gait velocity interpretation: Below normal speed for age/gender General Gait Details: x2 trials to and from toilet. cues for posture and position in RW. pt on 2L O2 via nasal canula throughout session. pt declined further ambulation due to fatigue.   Stairs            Wheelchair Mobility    Modified Rankin (Stroke Patients Only)       Balance                                    Cognition Arousal/Alertness: Awake/alert Behavior During Therapy: WFL for tasks assessed/performed Overall Cognitive Status: Within Functional Limits for tasks assessed                      Exercises General Exercises - Lower Extremity Long Arc Quad: AROM;Both;15 reps;Seated Hip Flexion/Marching: AROM;Both;5 reps;Seated    General Comments        Pertinent Vitals/Pain Pain Score: 8  Pain Location: stomach Pain Descriptors / Indicators: Aching Pain Intervention(s): Monitored during session;Limited activity within patient's tolerance    Home Living  Prior Function            PT Goals (current goals can now be found in the care plan section) Progress towards PT goals: Progressing toward goals    Frequency       PT Plan Current plan remains appropriate    Co-evaluation             End of Session Equipment Utilized During Treatment: Oxygen;Gait belt Activity Tolerance: Patient limited by fatigue Patient left: in chair;with call bell/phone within reach;with chair alarm set;with family/visitor present     Time: [id number] PT Time Calculation (min) (ACUTE ONLY): 43 min  Charges:  $Gait Training: 8-22  mins $Therapeutic Exercise: 8-22 mins $Therapeutic Activity: 8-22 mins                    G CodesHaze Justin 30-Dec-2015, 10:39 AM  Haze Justin, SPT [phone]

## 2015-12-10 NOTE — Progress Notes (Signed)
RT placed CPap on patient. 2L O2 bleed in. Patient tolerating well. RT will continue to monitor.

## 2015-12-11 DIAGNOSIS — J45901 Unspecified asthma with (acute) exacerbation: Secondary | ICD-10-CM

## 2015-12-11 LAB — RENAL FUNCTION PANEL
Albumin: 1.8 g/dL — ABNORMAL LOW (ref 3.5–5.0)
Anion gap: 11 (ref 5–15)
BUN: 30 mg/dL — ABNORMAL HIGH (ref 6–20)
CO2: 33 mmol/L — AB (ref 22–32)
Calcium: 7.5 mg/dL — ABNORMAL LOW (ref 8.9–10.3)
Chloride: 91 mmol/L — ABNORMAL LOW (ref 101–111)
Creatinine, Ser: 0.96 mg/dL (ref 0.44–1.00)
Glucose, Bld: 102 mg/dL — ABNORMAL HIGH (ref 65–99)
POTASSIUM: 3.7 mmol/L (ref 3.5–5.1)
Phosphorus: 3.1 mg/dL (ref 2.5–4.6)
Sodium: 135 mmol/L (ref 135–145)

## 2015-12-11 LAB — GLUCOSE, CAPILLARY
GLUCOSE-CAPILLARY: 96 mg/dL (ref 65–99)
GLUCOSE-CAPILLARY: 113 mg/dL — AB (ref 65–99)
GLUCOSE-CAPILLARY: 113 mg/dL — AB (ref 65–99)
Glucose-Capillary: 100 mg/dL — ABNORMAL HIGH (ref 65–99)

## 2015-12-11 LAB — MAGNESIUM: MAGNESIUM: 2 mg/dL (ref 1.7–2.4)

## 2015-12-11 MED ORDER — POTASSIUM CHLORIDE CRYS ER 20 MEQ PO TBCR
40.0000 meq | EXTENDED_RELEASE_TABLET | Freq: Once | ORAL | Status: AC
  Administered 2015-12-11: 40 meq via ORAL
  Filled 2015-12-11: qty 2

## 2015-12-11 MED ORDER — POTASSIUM CHLORIDE CRYS ER 20 MEQ PO TBCR
40.0000 meq | EXTENDED_RELEASE_TABLET | Freq: Every day | ORAL | Status: DC
Start: 2015-12-12 — End: 2015-12-15
  Administered 2015-12-12 – 2015-12-15 (×4): 40 meq via ORAL
  Filled 2015-12-11 (×4): qty 2

## 2015-12-11 MED ORDER — ENOXAPARIN SODIUM 80 MG/0.8ML ~~LOC~~ SOLN
75.0000 mg | SUBCUTANEOUS | Status: DC
  Administered 2015-12-11 – 2015-12-14 (×4): 75 mg via SUBCUTANEOUS
  Filled 2015-12-11 (×4): qty 0.8

## 2015-12-11 NOTE — Progress Notes (Signed)
Name: Gina Bender MRN: [id number] DOB: 1951-04-28    ADMISSION DATE:  11/28/2015 CONSULTATION DATE:  12/07/15  REFERRING MD :  Posey Pronto  CHIEF COMPLAINT:  SOB   HISTORY OF PRESENT ILLNESS:  Gina Bender is a 65 y.o. female with a PMH as outlined below.  She was admitted 12/07/15 with asthma exacerbation.  She was started on steroids, BD's, abx.  During her admission, she was tested for flu for which she was positive (influenza A by flu PCR).  She had gradual improvement but as of 12/07/15, continued to have some SOB, mainly with exertion, along with productive cough.  PCCM was called for further recs.  She has OSA / OHS for which she is on nocturnal CPAP.  She states she has one at home but does not use it as prescribed because she does not like the way that it fits.  She has been using the one here in the hospital and likes it much better.  She has asked if we can change her mask at home so that she can hopefully be compliant each night.   SUBJECTIVE:  Negative neuro w/u thus far including MRI brain, CTA head and neck.  Wore CPAP overnight. No documentation of waring cpap.  C/o constipation.  Still has cough but bringing sputum up a little better.     VITAL SIGNS: Temp:  [97.9 F (36.6 C)-98.1 F (36.7 C)] 98.1 F (36.7 C) (04/07 0649) Pulse Rate:  [64-90] 66 (04/07 0649) Resp:  [16-18] 16 (04/07 0649) BP: (126-138)/(63-64) 138/64 mmHg (04/07 0649) SpO2:  [90 %-97 %] 97 % (04/07 0836) FiO2 (%):  [28 %] 28 % (04/06 1328) Weight:  [341 lb (154.677 kg)] 341 lb (154.677 kg) (04/07 0649)  PHYSICAL EXAMINATION: General: Adult female, obese, resting in bed, in NAD. Sleepy. Reports wearing NIMVS all night but not documented. Neuro: A&O x 3, non-focal.  HEENT: [place]/AT. PERRL, sclerae anicteric. Cardiovascular: RRR, no M/R/G.  Lungs: Respirations even and unlabored.  Congested cough, scattered rhonchi, diminished bases.  Abdomen: Obese, BS x 4, soft, NT/ND.  Musculoskeletal: No  gross deformities, 1+ edema.  Skin: Intact, warm, no rashes.     Recent Labs Lab 12/09/15 0555 12/10/15 0701 12/11/15 0352  NA 131* 134* 135  K 5.4* 3.3* 3.7  CL 92* 89* 91*  CO2 27 30 33*  BUN 37* 31* 30*  CREATININE 0.89 1.00 0.96  GLUCOSE 163* 162* 102*    Recent Labs Lab 12/07/15 0425 12/08/15 0825 12/09/15  HGB 9.9* 10.1* 10.2*  HCT 29.7* 30.7* 30.0*  WBC 8.1 9.4 13.2*  PLT 139* 152 118*   Dg Chest Port 1 View  12/10/2015  CLINICAL DATA:  Respiratory failure, shortness of breath, cough EXAM: PORTABLE CHEST 1 VIEW COMPARISON:  Portable chest x-ray of 12/09/2015, 12/04/2015, and 11/28/2015 FINDINGS: Opacity remains at the right lung base most consistent with atelectasis and elevation of the right hemidiaphragm. If further assessment is warranted, CT of the chest with IV contrast would be recommended. The left lung is clear. Heart size is stable. IMPRESSION: No change in opacity at the right lung base most consistent with atelectasis and elevation of the right hemidiaphragm. Electronically Signed   By: Ivar Drape M.D.   On: 12/10/2015 08:15    STUDIES:  CXR 04/01 > atx. CXR 04/05 > progression of RLL atx. CT head 04/05 > neg. CTA head / neck 04/05 > neg for acute infarct.  B/l pleural effusions, R>L. MRI brain 04/05 > neg.  SIGNIFICANT EVENTS  03/25 > admit 04/03 > PCCM consult. 04/05 > code stroke called > cancelled later as all imaging negative. Filed Weights   12/07/15 1000 12/10/15 0500 12/11/15 0649  Weight: 306 lb 8 oz (139.027 kg) 335 lb (151.955 kg) 341 lb (154.677 kg)   ASSESSMENT / PLAN:  Acute hypoxic respiratory failure - due to influenza A + asthma exacerbation. Asthma exacerbation. OSA / OHS - on nocturnal CPAP but noncompliant as outpatient.   Cough - has increased chest congestion and feels she cant cough much sputum up. Atelectasis. Pleural effusions, R > L. 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  Plan: Continue supplemental O2 as needed to maintain SpO2 >  92%. Needs ambulatory desat study prior to discharge to assess for home O2 needs (ordered). Continue BD's, steroids -- wean prednisone to 50mg  daily (was 50mg  BID, not on chronic steroids, DM) tamiflu completed . 4/7 on 50 mg QD then wean by 10 mg q 4 days till off. Continue nocturnal CPAP - Will ask case management to assist in obtaining different mask for home. Would have pt follow up with pulmonary as outpatient and obtain PFT's at that time. Does not have pulmonary MD. Lasix added by primary team - agree. Needs negative i/o. Note weight gain. Continue pulm hygiene, mucinex, chest PT. Continue PPI, flonase -- would continue PPI and allergy regimen on d/c  Mobilize as able. F/u CXR intermittently -- will need f/u CXR with outpt pulm f/u   PCCM will be available PRN.  Richardson Landry Minor ACNP Maryanna Shape PCCM Pager [phone] till 3 pm If no answer page [phone] 12/11/2015, 11:47 AM   Attending Note:  I have examined patient, reviewed labs, studies and notes. I have discussed the case with S Minonr, and I agree with the data and plans as amended above.   Baltazar Apo, MD, PhD 12/14/2015, 2:35 PM [facility] [phone] or if no answer [phone]

## 2015-12-11 NOTE — Progress Notes (Signed)
Occupational Therapy Treatment Patient Details Name: Gina Bender MRN: [id number] DOB: 1951-04-29 Today's Date: 12/11/2015    History of present illness 65 y.o. female admitted to [facility] on 11/28/15 for SOB.  Dx with asthma exacerbation.  Pt wtih significant PMHx of back pain, asthma, DM, HTN, fibromyalgia, CA, recurrent UTIs and urinary urgency, morbid obesity, bil knee arthroscopy, and back surgery.  Of note, pt reports recent fall to right side and buttocks with inability to move well at home (only able to go to the bathroom, was not eating) since the fall. Per MD note there is no fracture.  Overnight on 4/4-4/5 and had acute onset of right-sided weakness with slow speech. Code stroke was called. Did not receive TPA as onset of symptoms was not clear. Head CT and MRI brain was unremarkable.    OT comments  Pt progressing. Pt complaining of bottom hurting when OOB in chair. Attempted mobility this pm, however, CNA came in to do EKG. Educated husband on importance of completing exercises with pt 3x/day and he verbalized understanding. Also educated him on importance of time OOB/upright positioning to improve pulmonary status. Continue to recommend SNF for rehab. Will reassess goals next session.   Follow Up Recommendations  SNF;Supervision/Assistance - 24 hour    Equipment Recommendations  Other (comment)    Recommendations for Other Services      Precautions / Restrictions Precautions Precautions: Fall Precaution Comments: history of recent fall       Mobility Bed Mobility Overal bed mobility: Needs Assistance Bed Mobility: Supine to Sit Rolling: Min assist   Supine to sit: Min assist Sit to supine: Mod assist   General bed mobility comments: assist to raise trunk off bed)  Transfers                      Balance                                   ADL        Pt completing grooming with set up at bed level                                        Vision                     Perception     Praxis      Cognition   Behavior During Therapy: [facility] for tasks assessed/performed Overall Cognitive Status: Within Functional Limits for tasks assessed                       Extremity/Trunk Assessment   generalized BUE weakness            Exercises General Exercises - Upper Extremity Shoulder Flexion: AROM;Strengthening;Both;15 reps;Supine;Theraband Theraband Level (Shoulder Flexion): Level 2 (Red) Shoulder Extension: Strengthening;Both;20 reps;Supine;Theraband Theraband Level (Shoulder Extension): Level 2 (Red) Shoulder ABduction: Both;20 reps;Supine;Theraband Theraband Level (Shoulder Abduction): Level 2 (Red) Shoulder ADduction: Strengthening;Both;20 reps;Supine;Theraband Theraband Level (Shoulder Adduction): Level 2 (Red) Elbow Flexion: Both;20 reps;Supine;Strengthening;Theraband Theraband Level (Elbow Flexion): Level 2 (Red) Elbow Extension: Both;20 reps;Supine;Strengthening;Theraband Theraband Level (Elbow Extension): Level 2 (Red) Other Exercises Other Exercises: Bed mobility - rollling B sides   Shoulder Instructions       General Comments      Pertinent Vitals/ Pain  Pain Assessment: 0-10 Pain Score: 3  Pain Location: butt Pain Descriptors / Indicators: Sore Pain Intervention(s): Limited activity within patient's tolerance;Monitored during session  Home Living                                          Prior Functioning/Environment              Frequency Min 2X/week     Progress Toward Goals  OT Goals(current goals can now be found in the care plan section)  Progress towards OT goals: Progressing toward goals  Acute Rehab OT Goals Patient Stated Goal: to be independent again OT Goal Formulation: With patient Time For Goal Achievement: 12/13/15 Potential to Achieve Goals: Good ADL Goals Pt Will Perform Grooming: with modified  independence;sitting Pt Will Perform Upper Body Bathing: with modified independence;sitting Pt Will Perform Lower Body Bathing: with min assist;sit to/from stand Pt Will Transfer to Toilet: with min assist;ambulating;bedside commode Pt Will Perform Toileting - Clothing Manipulation and hygiene: sit to/from stand;with min guard assist Pt/caregiver will Perform Home Exercise Program: Increased strength;Both right and left upper extremity;With theraband;Independently;With written HEP provided  Plan Discharge plan remains appropriate    Co-evaluation                 End of Session Equipment Utilized During Treatment: Oxygen   Activity Tolerance Patient tolerated treatment well   Patient Left in bed;with call bell/phone within reach   Nurse Communication Mobility status        Time: [id number] OT Time Calculation (min): 18 min  Charges: OT General Charges $OT Visit: 1 Procedure OT Treatments $Therapeutic Activity: 8-22 mins  Nick Stults,HILLARY 12/11/2015, 4:55 PM   [facility], OTR/L  [phone] 12/11/2015

## 2015-12-11 NOTE — Progress Notes (Signed)
[facility] PROGRESS NOTE  Gina Bender [id number] DOB: December 15, 1950 DOA: 11/28/2015 PCP: Nyoka Cowden, MD  Brief narrative 65 year old morbidly obese female with history of vocal cord dysfunction, asthma, 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 Hospital course was prolonged due to acute kidney injury, positive influenza A and acute on chronic diastolic CHF. Patient being treated for all these. Pulmonary following given her persistent respiratory symptoms. Overnight on 4/4-4/5 and had acute onset of right-sided weakness with slow speech. Code stroke was called. Did not receive TPA as onset of symptoms was not clear. Head CT and MRI brain was unremarkable. Chest x-ray showed worsened right lower lobe consolidation.  Assessment/Plan: Acute hypoxic respiratory failure Combination of asthma exacerbation, influenza A, acute on chronic diastolic CHF and possible aspiration pneumonia. -Still requiring 2 L O2 via nasal cannula. Improving very slowly. Lasix dose increased to 80 mg 3 times a day with good diuresis. Completed Tamiflu.. Continue DuoNeb. Tapering prednisone. -Continue Mucinex and chest PT. Continue Flonase, flutter valve and incentive spirometry.  -Seen by his speech and swallow and no signs or symptoms of aspiration. -Appreciate pulmonary follow-up. Needs to check for home O2 requirement prior to discharge and outpatient PFTs.  Acute right-sided weakness on 4/5 Code Stroke called. Imaging (CT angiogram head and neck and MRI brain unremarkable). No residual weakness but still has slow speech. No clear etiology. Chest x-ray with worsened right lower lobe infiltrate,  suspicion for aspiration. Also on quite high dose of gabapentin which might be causing speech symptoms. Dose reduced by  neurology.  Now at baseline.   Acute on chronic diastolic CHF Increase IV Lasix to 80 mg 3 times a day. Monitor strict I/O and daily weight. Has good diuresis (negative balance of 5.7 L since admission) Foley catheter for strict I/O monitoring. 2-D echo shows normal EF with grade 1 diastolic dysfunction. Reduced dose tomorrow if continues to diabetes well.  ? Aspiration pneumonia Worsened right lower lobe consolidation seen on chest x-ray. Added empiric Unasyn.  Has vocal cord dysfunction as well. Added florostor on recent history of C. difficile..  Influenza A Completed Tamiflu on 4/5  Prolonged QTC >600 . discontinue Paxil and Ditropan. Replenished low potassium and magnesium. Appreciate evaluation by EP. Recommends to keep her off medications that would prolong QTC. Added nadolol for rate control. Check repeat EKG today. Stable on monitor last 24 hours except for frequent PVCs. Plan to keep k>4 and mg >2   Obesity hypoventilation syndrome/OSA Continue nighttime CPAP.  UTI Recent history of C. difficile. Received 3 days of IV Rocephin this admission.  Hypokalemia/hypomagnesemia Replenished.     diabetes mellitus type 2 Daily Lantus with sliding scale coverage. Well controlled as outpatient (A1c of 6.1) . Neurontin for peripheral neuropathy.  Essential hypertension Continue BiDil. atenolol was discontinued due to due to sinus bradycardia on 3/30. Not sure how low the rate was. Now added nadolol.  Hypothyroidism Continue Synthroid  Morbid obesity   DVT Prophylaxis: Subcutaneous Lovenox  Diet diabetic/ Heart healthy  Code Status: Full code Family Communication: none at bedside Disposition Plan: Slow recovery . PT recommend skilled nursing facility. Will possibly be sometime next week.   Consultants:  Pulmonary  Procedures:  2-D echo  Antibiotics:  Completed 2 days of IV Rocephin  IV Unasyn since 4/5  HPI/Subjective:  Reports still having some  congestion , not sure  she is any better Objective: Filed Vitals:   12/11/15 0215 12/11/15 0649  BP:  138/64  Pulse: 89 66  Temp:  98.1 F (36.7 C)  Resp: 18 16    Intake/Output Summary (Last 24 hours) at 12/11/15 1240 Last data filed at 12/11/15 0651  Gross per 24 hour  Intake    480 ml  Output   3225 ml  Net  -2745 ml   Filed Weights   12/07/15 1000 12/10/15 0500 12/11/15 0649  Weight: 139.027 kg (306 lb 8 oz) 151.955 kg (335 lb) 154.677 kg (341 lb)    Exam:   General:  Obese,fatigued  HEENT: Appears less congested,  , moist mucosa  Cardiovascular: NS1&S2, no Murmurs  Respiratory: coarse breath sounds b/l  Abdomen: soft, NT, ND, BS+, foley+  Musculoskeletal: 1+ pitting edema b/l  CNS: AAOX3, nonfocal  Data Reviewed: Basic Metabolic Panel:  Recent Labs Lab 12/07/15 0425 12/08/15 0825 12/09/15 12/09/15 0555 12/10/15 0701 12/11/15 0352  NA 136 134* 130* 131* 134* 135  K 4.3 3.6 3.9 5.4* 3.3* 3.7  CL 99* 95* 92* 92* 89* 91*  CO2 26 29 28 27 30  33*  GLUCOSE 198* 240* 221* 163* 162* 102*  BUN 35* 37* 37* 37* 31* 30*  CREATININE 1.09* 1.07* 1.05* 0.89 1.00 0.96  CALCIUM 8.0* 8.1* 7.9* 7.6* 7.7* 7.5*  MG 2.0 2.0 1.9  --  1.6* 2.0  PHOS  --  3.3  --  3.4 3.5 3.1   Liver Function Tests:  Recent Labs Lab 12/08/15 0825 12/09/15 12/09/15 0555 12/10/15 0701 12/11/15 0352  AST  --  76*  --   --   --   ALT  --  55*  --   --   --   ALKPHOS  --  123  --   --   --   BILITOT  --  2.8*  --   --   --   PROT  --  5.6*  --   --   --   ALBUMIN 1.9* 1.9* 1.8* 2.1* 1.8*   No results for input(s): LIPASE, AMYLASE in the last 168 hours. No results for input(s): AMMONIA in the last 168 hours. CBC:  Recent Labs Lab 12/05/15 0520 12/07/15 0425 12/08/15 0825 12/09/15  WBC 7.1 8.1 9.4 13.2*  NEUTROABS 5.5  --  7.8* 11.1*  HGB 10.3* 9.9* 10.1* 10.2*  HCT 31.5* 29.7* 30.7* 30.0*  MCV 87.5 87.1 87.5 86.7  PLT 159 139* 152 118*   Cardiac Enzymes:  Recent  Labs Lab 12/05/15 0118 12/05/15 0520 12/05/15 1148 12/08/15 1318  TROPONINI 0.03 <0.03 0.03 0.03   BNP (last 3 results)  Recent Labs  11/28/15 0204  BNP 256.3*    ProBNP (last 3 results) No results for input(s): PROBNP in the last 8760 hours.  CBG:  Recent Labs Lab 12/10/15 1146 12/10/15 1716 12/10/15 2309 12/11/15 0654 12/11/15 1142  GLUCAP 191* 168* 136* 96 113*    No results found for this or any previous visit (from the past 240 hour(s)).   Studies: Dg Chest Port 1 View  12/10/2015  CLINICAL DATA:  Respiratory failure, shortness of breath, cough EXAM: PORTABLE CHEST 1 VIEW COMPARISON:  Portable chest x-ray of 12/09/2015, 12/04/2015, and 11/28/2015 FINDINGS: Opacity remains at the right lung base most consistent with atelectasis and elevation of the right hemidiaphragm. If further assessment is warranted, CT of the chest with IV contrast would be recommended. The left lung is clear. Heart size is stable. IMPRESSION: No  change in opacity at the right lung base most consistent with atelectasis and elevation of the right hemidiaphragm. Electronically Signed   By: Ivar Drape M.D.   On: 12/10/2015 08:15    Scheduled Meds: . acidophilus  1 capsule Oral Daily  . ampicillin-sulbactam (UNASYN) IV  3 g Intravenous Q6H  . enoxaparin (LOVENOX) injection  75 mg Subcutaneous Q24H  . fluticasone  2 spray Each Nare Daily  . furosemide  80 mg Intravenous 3 times per day  . gabapentin  200 mg Oral BID  . guaiFENesin  600 mg Oral BID  . hydrALAZINE  25 mg Oral 3 times per day  . insulin aspart  0-15 Units Subcutaneous TID WC  . insulin glargine  15 Units Subcutaneous Daily  . ipratropium-albuterol  3 mL Nebulization TID  . isosorbide mononitrate  30 mg Oral Daily  . levothyroxine  175 mcg Oral QAC breakfast  . magic mouthwash  5 mL Oral QID  .  morphine injection  1 mg Intravenous Once  . nadolol  20 mg Oral Daily  . pantoprazole  40 mg Oral BID  . polyethylene glycol  17 g  Oral BID  . predniSONE  50 mg Oral Q breakfast  . saccharomyces boulardii  250 mg Oral BID  . senna-docusate  1 tablet Oral BID   Continuous Infusions:     Time spent: 35 minutes    Kallen Delatorre, [place]  [facility] Pager [phone] If 7PM-7AM, please contact night-coverage at [email or web address], password [facility] 12/11/2015, 12:40 PM  LOS: 12 days

## 2015-12-12 LAB — CBC
HCT: 33.1 % — ABNORMAL LOW (ref 36.0–46.0)
Hemoglobin: 10.8 g/dL — ABNORMAL LOW (ref 12.0–15.0)
MCH: 29.2 pg (ref 26.0–34.0)
MCHC: 32.6 g/dL (ref 30.0–36.0)
MCV: 89.5 fL (ref 78.0–100.0)
PLATELETS: 136 10*3/uL — AB (ref 150–400)
RBC: 3.7 MIL/uL — ABNORMAL LOW (ref 3.87–5.11)
RDW: 22.9 % — AB (ref 11.5–15.5)
WBC: 10.4 10*3/uL (ref 4.0–10.5)

## 2015-12-12 LAB — RENAL FUNCTION PANEL
ALBUMIN: 1.8 g/dL — AB (ref 3.5–5.0)
Anion gap: 11 (ref 5–15)
BUN: 27 mg/dL — AB (ref 6–20)
CALCIUM: 7.8 mg/dL — AB (ref 8.9–10.3)
CO2: 34 mmol/L — AB (ref 22–32)
CREATININE: 0.97 mg/dL (ref 0.44–1.00)
Chloride: 93 mmol/L — ABNORMAL LOW (ref 101–111)
GFR calc Af Amer: >60 mL/min (ref >60)
GFR calc non Af Amer: 60 mL/min — ABNORMAL LOW (ref >60)
GLUCOSE: 80 mg/dL (ref 65–99)
PHOSPHORUS: 3.3 mg/dL (ref 2.5–4.6)
Potassium: 3.4 mmol/L — ABNORMAL LOW (ref 3.5–5.1)
SODIUM: 138 mmol/L (ref 135–145)

## 2015-12-12 LAB — GLUCOSE, CAPILLARY
GLUCOSE-CAPILLARY: 83 mg/dL (ref 65–99)
GLUCOSE-CAPILLARY: 113 mg/dL — AB (ref 65–99)
Glucose-Capillary: 159 mg/dL — ABNORMAL HIGH (ref 65–99)
Glucose-Capillary: 129 mg/dL — ABNORMAL HIGH (ref 65–99)

## 2015-12-12 LAB — MAGNESIUM: MAGNESIUM: 1.7 mg/dL (ref 1.7–2.4)

## 2015-12-12 MED ORDER — FUROSEMIDE 10 MG/ML IJ SOLN
80.0000 mg | Freq: Two times a day (BID) | INTRAMUSCULAR | Status: DC
  Administered 2015-12-12 – 2015-12-13 (×2): 80 mg via INTRAVENOUS
  Filled 2015-12-12 (×2): qty 8

## 2015-12-12 MED ORDER — POTASSIUM CHLORIDE CRYS ER 20 MEQ PO TBCR: 40.0000 meq | EXTENDED_RELEASE_TABLET | Freq: Once | ORAL | Status: DC

## 2015-12-12 MED ORDER — MAGNESIUM SULFATE 2 GM/50ML IV SOLN
2.0000 g | Freq: Once | INTRAVENOUS | Status: AC
  Administered 2015-12-12: 2 g via INTRAVENOUS
  Filled 2015-12-12: qty 50

## 2015-12-12 MED ORDER — IPRATROPIUM-ALBUTEROL 0.5-2.5 (3) MG/3ML IN SOLN
3.0000 mL | Freq: Two times a day (BID) | RESPIRATORY_TRACT | Status: DC
  Administered 2015-12-12 – 2015-12-13 (×3): 3 mL via RESPIRATORY_TRACT
  Filled 2015-12-12 (×7): qty 3

## 2015-12-12 NOTE — Progress Notes (Signed)
Placed patient on CPAP for the night.  Patient is tolerating well at this time. 

## 2015-12-12 NOTE — Progress Notes (Signed)
[facility] PROGRESS NOTE  Gina Bender [id number] DOB: 1951-05-24 DOA: 11/28/2015 PCP: Nyoka Cowden, MD  Brief narrative 65 year old morbidly obese female with history of vocal cord dysfunction, asthma, 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 Hospital course was prolonged due to acute kidney injury, positive influenza A and acute on chronic diastolic CHF. Patient being treated for all these. Pulmonary following given her persistent respiratory symptoms. Overnight on 4/4-4/5 and had acute onset of right-sided weakness with slow speech. Code stroke was called. Did not receive TPA as onset of symptoms was not clear. Head CT and MRI brain was unremarkable. Chest x-ray showed worsened right lower lobe consolidation.  Assessment/Plan: Acute hypoxic respiratory failure Combination of asthma exacerbation, influenza A, acute on chronic diastolic CHF and possible aspiration pneumonia. -Stable on 2 L O2 via nasal cannula.) Likely needs home O2) Improving very slowly. Lasix dose increased to 80 mg 3 times a day with good diuresis. Completed Tamiflu.. -Continue DuoNeb. Tapering prednisone. -Continue Mucinex and chest PT. Continue Flonase, flutter valve and incentive spirometry.  -Seen by his speech and swallow and no signs or symptoms of aspiration. -Appreciate pulmonary follow-up. Recommends outpatient PFT.   Acute right-sided weakness with speech impairment on 4/5 CT angiogram head and neck and MRI been negative for stroke. Suspect due to aspiration and medications (high-dose gabapentin, dose now adjusted). No further symptoms.   Acute on chronic diastolic CHF On IV Lasix to 80 mg 3 times a day. Diuresing quite well with negative balance of >82 since admission. Has good diuresis (negative  balance of 5.7 L since admission) Foley catheter for strict I/O monitoring. 2-D echo shows normal EF with grade 1 diastolic dysfunction. -Will reduce Lasix dose to 80 mg twice a day. I will discontinue Foley tomorrow.  ? Aspiration pneumonia Worsened right lower lobe consolidation seen on chest x-ray. On empiric Unasyn.  Has vocal cord dysfunction as well. Added florostor on recent history of C. Difficile.. Switch antibiotics to Unasyn.  Influenza A Completed Tamiflu on 4/5  Prolonged QTC >600  discontinue Paxil and Ditropan. Replenished low potassium and magnesium. Appreciate evaluation by EP. Recommends to keep her off medications that would prolong QTC. Added nadolol for rate control. Stable on monitor except for frequent PVCs. Plan to keep k>4 and mg >2. Follow-up EKG shows improved QTC.   Obesity hypoventilation syndrome/OSA Continue nighttime CPAP.  UTI Recent history of C. difficile. Received 3 days of IV Rocephin this admission.  Hypokalemia/hypomagnesemia Being replenished.     diabetes mellitus type 2 Daily Lantus with sliding scale coverage. Well controlled as outpatient (A1c of 6.1) . Neurontin for peripheral neuropathy.  Essential hypertension Continue BiDil. atenolol was discontinued due to due to sinus bradycardia on 3/30. Not sure how low the rate was. Now added nadolol.  Hypothyroidism Continue Synthroid  Morbid obesity   DVT Prophylaxis: Subcutaneous Lovenox  Diet diabetic/ Heart healthy  Code Status: Full code Family Communication: none at bedside Disposition Plan: Slow recovery . PT recommend skilled nursing facility. Will possibly be sometime next week.   Consultants:  Pulmonary  Procedures:  2-D echo  Antibiotics:  Completed 2 days of IV Rocephin  IV Unasyn since 4/5-4/8  Augmentin 4/8-  HPI/Subjective:  cough with congestion. Breathing seems better today. Objective: Filed Vitals:   12/12/15 0622 12/12/15 0745  BP: 129/70    Pulse: 76 80  Temp: 98.8 F (37.1 C)   Resp: 18 18    Intake/Output Summary (Last 24 hours) at 12/12/15 1039 Last data filed at 12/12/15 0630  Gross per 24 hour  Intake    720 ml  Output   3400 ml  Net  -2680 ml   Filed Weights   12/10/15 0500 12/11/15 0649 12/12/15 0622  Weight: 151.955 kg (335 lb) 154.677 kg (341 lb) 150.594 kg (332 lb)    Exam:   General:  Obese,fatigued  HEENT: Appears less congested,  , moist mucosa  Cardiovascular: NS1&S2, no Murmurs  Respiratory: coarse breath sounds b/l (better compared to last 2 days)  Abdomen: soft, NT, ND, BS+, foley+  Musculoskeletal: Trace edema b/l  CNS: AAOX3, nonfocal  Data Reviewed: Basic Metabolic Panel:  Recent Labs Lab 12/08/15 0825 12/09/15 12/09/15 0555 12/10/15 0701 12/11/15 0352 12/12/15 0536  NA 134* 130* 131* 134* 135 138  K 3.6 3.9 5.4* 3.3* 3.7 3.4*  CL 95* 92* 92* 89* 91* 93*  CO2 29 28 27 30  33* 34*  GLUCOSE 240* 221* 163* 162* 102* 80  BUN 37* 37* 37* 31* 30* 27*  CREATININE 1.07* 1.05* 0.89 1.00 0.96 0.97  CALCIUM 8.1* 7.9* 7.6* 7.7* 7.5* 7.8*  MG 2.0 1.9  --  1.6* 2.0 1.7  PHOS 3.3  --  3.4 3.5 3.1 3.3   Liver Function Tests:  Recent Labs Lab 12/09/15 12/09/15 0555 12/10/15 0701 12/11/15 0352 12/12/15 0536  AST 76*  --   --   --   --   ALT 55*  --   --   --   --   ALKPHOS 123  --   --   --   --   BILITOT 2.8*  --   --   --   --   PROT 5.6*  --   --   --   --   ALBUMIN 1.9* 1.8* 2.1* 1.8* 1.8*   No results for input(s): LIPASE, AMYLASE in the last 168 hours. No results for input(s): AMMONIA in the last 168 hours. CBC:  Recent Labs Lab 12/07/15 0425 12/08/15 0825 12/09/15 12/12/15 0536  WBC 8.1 9.4 13.2* 10.4  NEUTROABS  --  7.8* 11.1*  --   HGB 9.9* 10.1* 10.2* 10.8*  HCT 29.7* 30.7* 30.0* 33.1*  MCV 87.1 87.5 86.7 89.5  PLT 139* 152 118* 136*   Cardiac Enzymes:  Recent Labs Lab 12/05/15 1148 12/08/15 1318  TROPONINI 0.03 0.03   BNP (last 3  results)  Recent Labs  11/28/15 0204  BNP 256.3*    ProBNP (last 3 results) No results for input(s): PROBNP in the last 8760 hours.  CBG:  Recent Labs Lab 12/11/15 0654 12/11/15 1142 12/11/15 1700 12/11/15 2133 12/12/15 0650  GLUCAP 96 113* 100* 113* 83    No results found for this or any previous visit (from the past 240 hour(s)).   Studies: No results found.  Scheduled Meds: . acidophilus  1 capsule Oral Daily  . ampicillin-sulbactam (UNASYN) IV  3 g Intravenous Q6H  . enoxaparin (LOVENOX) injection  75 mg Subcutaneous Q24H  . fluticasone  2 spray Each Nare Daily  . furosemide  80 mg Intravenous 3 times per day  . gabapentin  200 mg Oral BID  . guaiFENesin  600 mg Oral BID  . hydrALAZINE  25 mg Oral 3 times per day  . insulin aspart  0-15 Units Subcutaneous TID WC  . insulin glargine  15 Units Subcutaneous Daily  .  ipratropium-albuterol  3 mL Nebulization TID  . isosorbide mononitrate  30 mg Oral Daily  . levothyroxine  175 mcg Oral QAC breakfast  . magic mouthwash  5 mL Oral QID  . magnesium sulfate 1 - 4 g bolus IVPB  2 g Intravenous Once  .  morphine injection  1 mg Intravenous Once  . nadolol  20 mg Oral Daily  . pantoprazole  40 mg Oral BID  . polyethylene glycol  17 g Oral BID  . potassium chloride  40 mEq Oral Daily  . predniSONE  50 mg Oral Q breakfast  . saccharomyces boulardii  250 mg Oral BID  . senna-docusate  1 tablet Oral BID   Continuous Infusions:     Time spent: 25 minutes    Gina Bender  [facility] Pager [phone] If 7PM-7AM, please contact night-coverage at [email or web address], password [facility] 12/12/2015, 10:39 AM  LOS: 13 days

## 2015-12-13 LAB — RENAL FUNCTION PANEL
ALBUMIN: 1.8 g/dL — AB (ref 3.5–5.0)
ANION GAP: 10 (ref 5–15)
BUN: 30 mg/dL — ABNORMAL HIGH (ref 6–20)
CALCIUM: 7.9 mg/dL — AB (ref 8.9–10.3)
CO2: 35 mmol/L — AB (ref 22–32)
Chloride: 92 mmol/L — ABNORMAL LOW (ref 101–111)
Creatinine, Ser: 0.94 mg/dL (ref 0.44–1.00)
Glucose, Bld: 84 mg/dL (ref 65–99)
PHOSPHORUS: 3 mg/dL (ref 2.5–4.6)
Potassium: 3.6 mmol/L (ref 3.5–5.1)
SODIUM: 137 mmol/L (ref 135–145)

## 2015-12-13 LAB — GLUCOSE, CAPILLARY
GLUCOSE-CAPILLARY: 90 mg/dL (ref 65–99)
GLUCOSE-CAPILLARY: 196 mg/dL — AB (ref 65–99)
Glucose-Capillary: 152 mg/dL — ABNORMAL HIGH (ref 65–99)

## 2015-12-13 LAB — MAGNESIUM: MAGNESIUM: 1.9 mg/dL (ref 1.7–2.4)

## 2015-12-13 MED ORDER — MAGNESIUM SULFATE 2 GM/50ML IV SOLN
2.0000 g | Freq: Once | INTRAVENOUS | Status: AC
  Administered 2015-12-13: 2 g via INTRAVENOUS
  Filled 2015-12-13: qty 50

## 2015-12-13 MED ORDER — FUROSEMIDE 10 MG/ML IJ SOLN
40.0000 mg | Freq: Two times a day (BID) | INTRAMUSCULAR | Status: DC
  Administered 2015-12-13 – 2015-12-15 (×4): 40 mg via INTRAVENOUS
  Filled 2015-12-13 (×3): qty 4

## 2015-12-13 MED ORDER — AMOXICILLIN-POT CLAVULANATE 875-125 MG PO TABS
1.0000 | ORAL_TABLET | Freq: Two times a day (BID) | ORAL | Status: DC
  Administered 2015-12-13 – 2015-12-15 (×5): 1 via ORAL
  Filled 2015-12-13 (×5): qty 1

## 2015-12-13 NOTE — Progress Notes (Signed)
[facility] PROGRESS NOTE  Gina Bender [id number] DOB: Jun 10, 1951 DOA: 11/28/2015 PCP: Nyoka Cowden, MD  Brief narrative 65 year old morbidly obese female with history of vocal cord dysfunction, asthma, 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 Hospital course was prolonged due to acute kidney injury, positive influenza A and acute on chronic diastolic CHF. Patient being treated for all these. Pulmonary following given her persistent respiratory symptoms. Overnight on 4/4-4/5 and had acute onset of right-sided weakness with slow speech. Code stroke was called. Did not receive TPA as onset of symptoms was not clear. Head CT and MRI brain was unremarkable. Chest x-ray showed worsened right lower lobe consolidation.  Assessment/Plan: Acute hypoxic respiratory failure Combination of asthma exacerbation, influenza A, acute on chronic diastolic CHF and possible aspiration pneumonia. -Stable on 2 L O2 via nasal cannula.( Likely needs home O2) . Slow improvement. Diuresing well with IV Lasix. Dose reduced. Discontinue Foley.  Completed Tamiflu.. -Continue DuoNeb. Tapering prednisone. -Continue Mucinex and chest PT. Continue Flonase, flutter valve and incentive spirometry.  -Pulmonary signed off. Recommends outpatient PFT.   Acute right-sided weakness with speech impairment on 4/5 CT angiogram head and neck and MRI been negative for stroke. Suspect due to aspiration and medications (high-dose gabapentin, dose now adjusted). No further symptoms.   Acute on chronic diastolic CHF Diuresed well with aggressive dose of IV Lasix. Dose reduced today. (Negative balance of >10 L). Monitor strict I/O. DC Foley.  2-D echo shows normal EF with grade 1 diastolic dysfunction.   ? Aspiration  pneumonia Worsened right lower lobe consolidation seen on chest x-ray. On empiric Unasyn.  Has vocal cord dysfunction as well. Added florostor on recent history of C. Difficile.. Switched antibiotics to Augmentin.  Influenza A Completed Tamiflu on 4/5  Prolonged QTC >600 Discontinued Paxil and Ditropan. Replenishing low potassium and magnesium. Appreciate evaluation by EP. Recommends to keep her off medications that would prolong QTC. Added nadolol for rate control. Stable on monitor except for frequent PVCs. Plan to keep k>4 and mg >2. Still has prolonged QTC ranging from 500-550. Continue telemetry monitoring.   Obesity hypoventilation syndrome/OSA Continue nighttime CPAP.  UTI Recent history of C. difficile. Received 3 days of IV Rocephin this admission.  Hypokalemia/hypomagnesemia Being replenished.     diabetes mellitus type 2 Daily Lantus with sliding scale coverage. Well controlled as outpatient (A1c of 6.1) . Neurontin for peripheral neuropathy.  Essential hypertension Continue BiDil. atenolol was discontinued due to due to sinus bradycardia on 3/30. Not sure how low the rate was. Now added nadolol.  Hypothyroidism Continue Synthroid  Morbid obesity   DVT Prophylaxis: Subcutaneous Lovenox  Diet diabetic/ Heart healthy  Code Status: Full code Family Communication: none at bedside Disposition Plan: Slow recovery . PT recommend skilled nursing facility. Possibly in the next 48-72 hours  Consultants:  Pulmonary  EP  Procedures:  2-D echo  Antibiotics:  Completed 2 days of IV Rocephin  IV Unasyn since 4/5-4/8  Augmentin 4/8-  HPI/Subjective:  cough with congestion. Breathing seems better today. Objective: Filed Vitals:   12/13/15 0117 12/13/15 0709  BP:  134/60  Pulse: 67 63  Temp:  98.3 F (36.8 C)  Resp: 18 16    Intake/Output Summary (Last 24 hours) at 12/13/15 1300 Last data filed at 12/13/15 0711  Gross per 24 hour  Intake      0  ml   Output    750 ml  Net   -750 ml   Filed Weights   12/10/15 0500 12/11/15 0649 12/12/15 0622  Weight: 151.955 kg (335 lb) 154.677 kg (341 lb) 150.594 kg (332 lb)    Exam:   General:  Obese,fatigued  HEENT: Appears less congested,  , moist mucosa  Cardiovascular: NS1&S2, no Murmurs  Respiratory: Improved breath sounds bilaterally  Abdomen: soft, NT, ND, BS+,  Musculoskeletal: Trace edema b/l  CNS: Alert and oriented  Data Reviewed: Basic Metabolic Panel:  Recent Labs Lab 12/09/15 12/09/15 0555 12/10/15 0701 12/11/15 0352 12/12/15 0536 12/13/15 0616  NA 130* 131* 134* 135 138 137  K 3.9 5.4* 3.3* 3.7 3.4* 3.6  CL 92* 92* 89* 91* 93* 92*  CO2 28 27 30  33* 34* 35*  GLUCOSE 221* 163* 162* 102* 80 84  BUN 37* 37* 31* 30* 27* 30*  CREATININE 1.05* 0.89 1.00 0.96 0.97 0.94  CALCIUM 7.9* 7.6* 7.7* 7.5* 7.8* 7.9*  MG 1.9  --  1.6* 2.0 1.7 1.9  PHOS  --  3.4 3.5 3.1 3.3 3.0   Liver Function Tests:  Recent Labs Lab 12/09/15 12/09/15 0555 12/10/15 0701 12/11/15 0352 12/12/15 0536 12/13/15 0616  AST 76*  --   --   --   --   --   ALT 55*  --   --   --   --   --   ALKPHOS 123  --   --   --   --   --   BILITOT 2.8*  --   --   --   --   --   PROT 5.6*  --   --   --   --   --   ALBUMIN 1.9* 1.8* 2.1* 1.8* 1.8* 1.8*   No results for input(s): LIPASE, AMYLASE in the last 168 hours. No results for input(s): AMMONIA in the last 168 hours. CBC:  Recent Labs Lab 12/07/15 0425 12/08/15 0825 12/09/15 12/12/15 0536  WBC 8.1 9.4 13.2* 10.4  NEUTROABS  --  7.8* 11.1*  --   HGB 9.9* 10.1* 10.2* 10.8*  HCT 29.7* 30.7* 30.0* 33.1*  MCV 87.1 87.5 86.7 89.5  PLT 139* 152 118* 136*   Cardiac Enzymes:  Recent Labs Lab 12/08/15 1318  TROPONINI 0.03   BNP (last 3 results)  Recent Labs  11/28/15 0204  BNP 256.3*    ProBNP (last 3 results) No results for input(s): PROBNP in the last 8760 hours.  CBG:  Recent Labs Lab 12/12/15 0650 12/12/15 1141  12/12/15 1617 12/12/15 2228 12/13/15 0713  GLUCAP 83 113* 159* 129* 90    No results found for this or any previous visit (from the past 240 hour(s)).   Studies: No results found.  Scheduled Meds: . acidophilus  1 capsule Oral Daily  . enoxaparin (LOVENOX) injection  75 mg Subcutaneous Q24H  . fluticasone  2 spray Each Nare Daily  . furosemide  40 mg Intravenous Q12H  . gabapentin  200 mg Oral BID  . guaiFENesin  600 mg Oral BID  . hydrALAZINE  25 mg Oral 3 times per day  . insulin aspart  0-15 Units Subcutaneous TID WC  . insulin glargine  15 Units Subcutaneous Daily  . ipratropium-albuterol  3 mL Nebulization BID  . isosorbide mononitrate  30 mg Oral Daily  . levothyroxine  175 mcg Oral QAC breakfast  . magic mouthwash  5 mL Oral QID  .  morphine injection  1 mg Intravenous Once  . nadolol  20 mg Oral Daily  . pantoprazole  40 mg Oral BID  . polyethylene glycol  17 g Oral BID  . potassium chloride  40 mEq Oral Daily  . predniSONE  50 mg Oral Q breakfast  . saccharomyces boulardii  250 mg Oral BID  . senna-docusate  1 tablet Oral BID   Continuous Infusions:     Time spent: 25 minutes    Louellen Molder  [facility] Pager [phone] If 7PM-7AM, please contact night-coverage at [email or web address], password [facility] 12/13/2015, 1:00 PM  LOS: 14 days

## 2015-12-14 LAB — RENAL FUNCTION PANEL
ALBUMIN: 1.9 g/dL — AB (ref 3.5–5.0)
Anion gap: 10 (ref 5–15)
BUN: 27 mg/dL — ABNORMAL HIGH (ref 6–20)
CALCIUM: 8.2 mg/dL — AB (ref 8.9–10.3)
CHLORIDE: 94 mmol/L — AB (ref 101–111)
CO2: 35 mmol/L — AB (ref 22–32)
CREATININE: 0.96 mg/dL (ref 0.44–1.00)
GFR calc Af Amer: >60 mL/min (ref >60)
GFR calc non Af Amer: >60 mL/min (ref >60)
GLUCOSE: 113 mg/dL — AB (ref 65–99)
Phosphorus: 3.2 mg/dL (ref 2.5–4.6)
Potassium: 3.6 mmol/L (ref 3.5–5.1)
Sodium: 139 mmol/L (ref 135–145)

## 2015-12-14 LAB — GLUCOSE, CAPILLARY
Glucose-Capillary: 105 mg/dL — ABNORMAL HIGH (ref 65–99)
Glucose-Capillary: 129 mg/dL — ABNORMAL HIGH (ref 65–99)
Glucose-Capillary: 126 mg/dL — ABNORMAL HIGH (ref 65–99)
Glucose-Capillary: 126 mg/dL — ABNORMAL HIGH (ref 65–99)

## 2015-12-14 LAB — MAGNESIUM: Magnesium: 2 mg/dL (ref 1.7–2.4)

## 2015-12-14 NOTE — Progress Notes (Signed)
Name: Gina Bender MRN: [id number] DOB: 1951/05/07    ADMISSION DATE:  11/28/2015 CONSULTATION DATE:  12/07/15  REFERRING MD :  Posey Pronto  CHIEF COMPLAINT:  SOB   HISTORY OF PRESENT ILLNESS:  Gina Bender is a 65 y.o. female with a PMH as outlined below.  She was admitted 12/07/15 with asthma exacerbation.  She was started on steroids, BD's, abx.  During her admission, she was tested for flu for which she was positive (influenza A by flu PCR).  She had gradual improvement but as of 12/07/15, continued to have some SOB, mainly with exertion, along with productive cough.  PCCM was called for further recs.  She has OSA / OHS for which she is on nocturnal CPAP.  She states she has one at home but does not use it as prescribed because she does not like the way that it fits.  She has been using the one here in the hospital and likes it much better.  She has asked if we can change her mask at home so that she can hopefully be compliant each night.   SUBJECTIVE:  Patient states that she used CPAP all night. She states that she has weak legs and her knees gave up on her , she was about to fall in the bathroom.  VITAL SIGNS: Temp:  [97.9 F (36.6 C)] 97.9 F (36.6 C) (04/09 2315) Pulse Rate:  [63-69] 69 (04/10 0006) Resp:  [16] 16 (04/10 0006) BP: (145)/(92) 145/92 mmHg (04/09 2315) SpO2:  [94 %-97 %] 94 % (04/10 0006) Weight:  [328 lb (148.78 kg)] 328 lb (148.78 kg) (04/10 0700)  PHYSICAL EXAMINATION: General: Adult female, obese, resting in bed, in NAD. Mar[facility] Kitchen Neuro: A&O x 3, non-focal.  HEENT: [place]/AT. PERRL, sclerae anicteric. Cardiovascular: RRR, no M/R/G.  Lungs: Scattered rhonchi throughout, diminished towards bases  Abdomen: Obese, BS x 4, soft, NT/ND.  Musculoskeletal: No gross deformities, 1+ edema.  Skin: Intact, warm, no rashes.     Recent Labs Lab 12/12/15 0536 12/13/15 0616 12/14/15 0406  NA 138 137 139  K 3.4* 3.6 3.6  CL 93* 92* 94*  CO2 34* 35* 35*  BUN 27* 30*  27*  CREATININE 0.97 0.94 0.96  GLUCOSE 80 84 113*    Recent Labs Lab 12/08/15 0825 12/09/15 12/12/15 0536  HGB 10.1* 10.2* 10.8*  HCT 30.7* 30.0* 33.1*  WBC 9.4 13.2* 10.4  PLT 152 118* 136*   No results found.  STUDIES:  CXR 04/01 > atx. CXR 04/05 > progression of RLL atx. CT head 04/05 > neg. CTA head / neck 04/05 > neg for acute infarct.  B/l pleural effusions, R>L. MRI brain 04/05 > neg.  SIGNIFICANT EVENTS  03/25 > admit 04/03 > PCCM consult. 04/05 > code stroke called > cancelled later as all imaging negative. Filed Weights   12/11/15 0649 12/12/15 0622 12/14/15 0700  Weight: 341 lb (154.677 kg) 332 lb (150.594 kg) 328 lb (148.78 kg)   ASSESSMENT / PLAN:  Acute hypoxic respiratory failure - due to influenza A + asthma exacerbation. Asthma exacerbation. OSA / OHS - on nocturnal CPAP but noncompliant as outpatient.   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  Plan: Continue supplemental O2 as needed to maintain SpO2 > 92%. Continue BD's, steroids -- wean prednisone to 50mg  daily (was 50mg  BID, not on chronic steroids, DM) tamiflu completed . 4/7 on 50 mg QD then wean by 10 mg q 4 days till off. Continue nocturnal CPAP . Would have pt follow up with pulmonary  as outpatient and obtain PFT's at that time. Does not have pulmonary MD. Lasix added by primary team - agree. Needs negative i/o. Note weight gain. Continue pulm hygiene, mucinex, chest PT. Continue PPI, flonase -- would continue PPI and allergy regimen on d/c  Mobilize as able. F/u CXR intermittently -- will need f/u CXR with outpt pulm f/u  Discharge to SNF for rehab tentatively 4/11.      [facility]

## 2015-12-14 NOTE — Progress Notes (Signed)
[facility] PROGRESS NOTE  Gina Bender [id number] DOB: Sep 17, 1950 DOA: 11/28/2015 PCP: Nyoka Cowden, MD  Brief narrative 65 year old morbidly obese female with history of vocal cord dysfunction, asthma, 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 Hospital course was prolonged due to acute kidney injury, positive influenza A and acute on chronic diastolic CHF. Patient being treated for all these. Pulmonary following given her persistent respiratory symptoms. Overnight on 4/4-4/5 and had acute onset of right-sided weakness with slow speech. Code stroke was called. Did not receive TPA as onset of symptoms was not clear. Head CT and MRI brain was unremarkable. Chest x-ray showed worsened right lower lobe consolidation.  Assessment/Plan: Acute hypoxic respiratory failure Combination of asthma exacerbation, influenza A, acute on chronic diastolic CHF and possible aspiration pneumonia. -Stable on 2 L O2 via nasal cannula.( Likely needs home O2) . Slow improvement. Diuresing well with IV Lasix. Dose reduced. Discontinue Foley.  Completed Tamiflu.. -Continue DuoNeb. Tapering prednisone. ( wean off 10 mg every 4 days) -Continue Mucinex and chest PT. Continue Flonase, flutter valve and incentive spirometry.  -Pulmonary signed off. Recommends outpatient PFT.   Acute right-sided weakness with speech impairment on 4/5 CT angiogram head and neck and MRI been negative for stroke. Suspect due to aspiration and medications (high-dose gabapentin, dose now adjusted). No further symptoms.   Acute on chronic diastolic CHF Diuresed well with aggressive dose of IV Lasix. Dose reduced to 40 mg IV bid. (Negative balance of >11 L). Monitor strict I/O.   2-D echo shows normal EF with grade 1 diastolic  dysfunction. Transition to po lasix tomorrow.   ? Aspiration pneumonia Worsened right lower lobe consolidation seen on chest x-ray. On empiric Unasyn.  Has vocal cord dysfunction as well. Added florostor on recent history of C. Difficile.. Switched antibiotics to Augmentin.  Influenza A Completed Tamiflu on 4/5  Prolonged QTC >600 Discontinued Paxil and Ditropan. Replenishing low potassium and magnesium. Appreciate evaluation by EP. Recommends to keep her off medications that would prolong QTC. Added nadolol for rate control. Stable on monitor except for frequent PVCs. Plan to keep k>4 and mg >2. QTC improved to 490 on EKG.   Obesity hypoventilation syndrome/OSA Continue nighttime CPAP.  UTI Recent history of C. difficile. Received 3 days of IV Rocephin this admission.  Hypokalemia/hypomagnesemia Being replenished.     diabetes mellitus type 2 Daily Lantus with sliding scale coverage. Well controlled as outpatient (A1c of 6.1) . Neurontin for peripheral neuropathy.  Essential hypertension Continue BiDil. atenolol was discontinued due to due to sinus bradycardia on 3/30. Not sure how low the rate was. Now added nadolol.  Hypothyroidism Continue Synthroid  Morbid obesity   DVT Prophylaxis: Subcutaneous Lovenox  Diet diabetic/ Heart healthy  Code Status: Full code Family Communication: none at bedside Disposition Plan: Slow recovery . PT recommend skilled nursing facility. Possibly in the next 24 hours  Consultants:  Pulmonary  EP  Procedures:  2-D echo  Antibiotics:  Completed 2 days of IV Rocephin  IV Unasyn since 4/5-4/8  Augmentin 4/8-  HPI/Subjective:  cough with congestion. Breathing  better  Objective: Filed Vitals:   12/13/15 2315 12/14/15 0006  BP: 145/92   Pulse: 63 69  Temp: 97.9 F (36.6 C)   Resp: 16 16    Intake/Output Summary (Last 24 hours) at 12/14/15 1046 Last data filed at 12/14/15 0900  Gross per 24 hour  Intake    280  ml  Output   1002 ml  Net   -722 ml   Filed Weights   12/11/15 0649 12/12/15 0622 12/14/15 0700  Weight: 154.677 kg (341 lb) 150.594 kg (332 lb) 148.78 kg (328 lb)    Exam:   General:  Obese,fatigued  HEENT:  moist mucosa  Cardiovascular: NS1&S2, no Murmurs  Respiratory: Improved breath sounds bilaterally  Abdomen: soft, NT, ND, BS+,  Musculoskeletal: Improved edema b/l    Data Reviewed: Basic Metabolic Panel:  Recent Labs Lab 12/10/15 0701 12/11/15 0352 12/12/15 0536 12/13/15 0616 12/14/15 0406  NA 134* 135 138 137 139  K 3.3* 3.7 3.4* 3.6 3.6  CL 89* 91* 93* 92* 94*  CO2 30 33* 34* 35* 35*  GLUCOSE 162* 102* 80 84 113*  BUN 31* 30* 27* 30* 27*  CREATININE 1.00 0.96 0.97 0.94 0.96  CALCIUM 7.7* 7.5* 7.8* 7.9* 8.2*  MG 1.6* 2.0 1.7 1.9 2.0  PHOS 3.5 3.1 3.3 3.0 3.2   Liver Function Tests:  Recent Labs Lab 12/09/15  12/10/15 0701 12/11/15 0352 12/12/15 0536 12/13/15 0616 12/14/15 0406  AST 76*  --   --   --   --   --   --   ALT 55*  --   --   --   --   --   --   ALKPHOS 123  --   --   --   --   --   --   BILITOT 2.8*  --   --   --   --   --   --   PROT 5.6*  --   --   --   --   --   --   ALBUMIN 1.9*  < > 2.1* 1.8* 1.8* 1.8* 1.9*  < > = values in this interval not displayed. No results for input(s): LIPASE, AMYLASE in the last 168 hours. No results for input(s): AMMONIA in the last 168 hours. CBC:  Recent Labs Lab 12/08/15 0825 12/09/15 12/12/15 0536  WBC 9.4 13.2* 10.4  NEUTROABS 7.8* 11.1*  --   HGB 10.1* 10.2* 10.8*  HCT 30.7* 30.0* 33.1*  MCV 87.5 86.7 89.5  PLT 152 118* 136*   Cardiac Enzymes:  Recent Labs Lab 12/08/15 1318  TROPONINI 0.03   BNP (last 3 results)  Recent Labs  11/28/15 0204  BNP 256.3*    ProBNP (last 3 results) No results for input(s): PROBNP in the last 8760 hours.  CBG:  Recent Labs Lab 12/12/15 2228 12/13/15 0713 12/13/15 1636 12/13/15 2319 12/14/15 0640  GLUCAP 129* 90 196* 152* 105*     No results found for this or any previous visit (from the past 240 hour(s)).   Studies: No results found.  Scheduled Meds: . acidophilus  1 capsule Oral Daily  . amoxicillin-clavulanate  1 tablet Oral Q12H  . enoxaparin (LOVENOX) injection  75 mg Subcutaneous Q24H  . fluticasone  2 spray Each Nare Daily  . furosemide  40 mg Intravenous Q12H  . gabapentin  200 mg Oral BID  . guaiFENesin  600 mg Oral BID  . hydrALAZINE  25 mg Oral 3 times per day  . insulin aspart  0-15 Units Subcutaneous TID WC  . insulin glargine  15 Units Subcutaneous Daily  . ipratropium-albuterol  3 mL Nebulization BID  . isosorbide mononitrate  30 mg Oral Daily  . levothyroxine  175 mcg Oral QAC breakfast  . magic  mouthwash  5 mL Oral QID  .  morphine injection  1 mg Intravenous Once  . nadolol  20 mg Oral Daily  . pantoprazole  40 mg Oral BID  . polyethylene glycol  17 g Oral BID  . potassium chloride  40 mEq Oral Daily  . predniSONE  50 mg Oral Q breakfast  . saccharomyces boulardii  250 mg Oral BID  . senna-docusate  1 tablet Oral BID   Continuous Infusions:     Time spent: 25 minutes    Louellen Molder  [facility] Pager [phone] If 7PM-7AM, please contact night-coverage at [email or web address], password [facility] 12/14/2015, 10:46 AM  LOS: 15 days

## 2015-12-14 NOTE — Progress Notes (Addendum)
Physical Therapy Treatment Patient Details Name: Gina Bender MRN: [id number] DOB: 03/12/1951 Today's Date: 12/14/2015    History of Present Illness 65 y.o. female admitted to [facility] on 11/28/15 for SOB.  Dx with asthma exacerbation.  Pt wtih significant PMHx of back pain, asthma, DM, HTN, fibromyalgia, CA, recurrent UTIs and urinary urgency, morbid obesity, bil knee arthroscopy, and back surgery.  Of note, pt reports recent fall to right side and buttocks with inability to move well at home (only able to go to the bathroom, was not eating) since the fall. Per MD note there is no fracture.      PT Comments    Patient demonstrated increased generalized weakness this session. Spoke with pt about importance of OOB mobility and encouraged pt to use BSC or ambulate to restroom with nursing staff instead of using bed pan. Also encouraged pt to ambulate whenever possible with staff assistance to prevent further deconditioning. Pt verbalized agreement. Current plan remains appropriate.   Follow Up Recommendations  SNF     Equipment Recommendations  None recommended by PT    Recommendations for Other Services       Precautions / Restrictions Precautions Precautions: Fall Precaution Comments: hitory of recent fall Restrictions Weight Bearing Restrictions: No    Mobility  Bed Mobility Overal bed mobility: Needs Assistance Bed Mobility: Supine to Sit     Supine to sit: Supervision;HOB elevated     General bed mobility comments: cues for sequencing and technique with increased time and use of bedrails needed  Transfers Overall transfer level: Needs assistance Equipment used: Rolling walker (2 wheeled)   Sit to Stand: Mod assist Stand pivot transfers: Min guard       General transfer comment: cues for hand placement each trial; mod A to stand with pt using momentum to power up and min guard for pivot to BSC from EOB  Ambulation/Gait Ambulation/Gait assistance: Min  guard Ambulation Distance (Feet): 8 Feet Assistive device: Rolling walker (2 wheeled) Gait Pattern/deviations: Step-to pattern;Decreased step length - right;Decreased step length - left;Decreased stride length;Trunk flexed     General Gait Details: cues for posture and position of RW; pt very anxious about falling and c/o R knee pain and feeling of instability; pt reported that her R knee buckled previously when in the bathroom with nursing staff; audible R knee "popping" when ambulating   Stairs            Wheelchair Mobility    Modified Rankin (Stroke Patients Only)       Balance Overall balance assessment: Needs assistance Sitting-balance support: Feet supported;No upper extremity supported Sitting balance-Leahy Scale: Good     Standing balance support: Bilateral upper extremity supported Standing balance-Leahy Scale: Poor                      Cognition Arousal/Alertness: Awake/alert Behavior During Therapy: WFL for tasks assessed/performed Overall Cognitive Status: Within Functional Limits for tasks assessed                      Exercises      General Comments General comments (skin integrity, edema, etc.): encouraged pt to use BSC or ambulate to restroom with nursing staff and to stop using bedpan; pt verbalized understanding that she needs to get OOB when ever possible and that staying in bed will make her weaker      Pertinent Vitals/Pain Pain Assessment: Faces Faces Pain Scale: Hurts a little bit Pain Location: buttocks Pain  Descriptors / Indicators: Sore Pain Intervention(s): Monitored during session;Repositioned    Home Living                      Prior Function            PT Goals (current goals can now be found in the care plan section) Acute Rehab PT Goals Patient Stated Goal: none stated Progress towards PT goals: Not progressing toward goals - comment    Frequency  Min 3X/week    PT Plan Current plan remains  appropriate    Co-evaluation             End of Session Equipment Utilized During Treatment: Oxygen;Gait belt Activity Tolerance: Patient limited by fatigue Patient left: in chair;with call bell/phone within reach     Time: 1355-1448 PT Time Calculation (min) (ACUTE ONLY): 53 min  Charges:  $Gait Training: 23-37 mins $Therapeutic Activity: 23-37 mins                    G Codes:      Salina April, PTA Pager: [phone]   12/14/2015, 3:06 PM

## 2015-12-14 NOTE — Progress Notes (Signed)
CSW will continue to follow for pt DC needs- per MD note anticipate DC this week- CSW updated [facility]  Domenica Reamer, [facility] Clinical Social Worker [phone]

## 2015-12-14 NOTE — Progress Notes (Signed)
Pt is on NIV at this tolerating it well.

## 2015-12-14 NOTE — Progress Notes (Signed)
Pt refuse scheduled neb. Pt states they dont help her. Pt is stable at this time, family at the bedside.

## 2015-12-15 DIAGNOSIS — Z794 Long term (current) use of insulin: Secondary | ICD-10-CM | POA: Diagnosis not present

## 2015-12-15 DIAGNOSIS — R402431 Glasgow coma scale score 3-8, in the field [EMT or ambulance]: Secondary | ICD-10-CM | POA: Diagnosis not present

## 2015-12-15 DIAGNOSIS — R1311 Dysphagia, oral phase: Secondary | ICD-10-CM | POA: Diagnosis not present

## 2015-12-15 DIAGNOSIS — J9600 Acute respiratory failure, unspecified whether with hypoxia or hypercapnia: Secondary | ICD-10-CM | POA: Diagnosis not present

## 2015-12-15 DIAGNOSIS — R1310 Dysphagia, unspecified: Secondary | ICD-10-CM | POA: Diagnosis not present

## 2015-12-15 DIAGNOSIS — E2740 Unspecified adrenocortical insufficiency: Secondary | ICD-10-CM | POA: Diagnosis not present

## 2015-12-15 DIAGNOSIS — G4733 Obstructive sleep apnea (adult) (pediatric): Secondary | ICD-10-CM | POA: Diagnosis not present

## 2015-12-15 DIAGNOSIS — G8191 Hemiplegia, unspecified affecting right dominant side: Secondary | ICD-10-CM | POA: Diagnosis not present

## 2015-12-15 DIAGNOSIS — J9601 Acute respiratory failure with hypoxia: Secondary | ICD-10-CM | POA: Diagnosis not present

## 2015-12-15 DIAGNOSIS — R4182 Altered mental status, unspecified: Secondary | ICD-10-CM | POA: Diagnosis not present

## 2015-12-15 DIAGNOSIS — E1140 Type 2 diabetes mellitus with diabetic neuropathy, unspecified: Secondary | ICD-10-CM | POA: Diagnosis not present

## 2015-12-15 DIAGNOSIS — Z923 Personal history of irradiation: Secondary | ICD-10-CM | POA: Diagnosis not present

## 2015-12-15 DIAGNOSIS — R5383 Other fatigue: Secondary | ICD-10-CM | POA: Diagnosis not present

## 2015-12-15 DIAGNOSIS — Z66 Do not resuscitate: Secondary | ICD-10-CM | POA: Diagnosis not present

## 2015-12-15 DIAGNOSIS — M6281 Muscle weakness (generalized): Secondary | ICD-10-CM | POA: Diagnosis not present

## 2015-12-15 DIAGNOSIS — M6289 Other specified disorders of muscle: Secondary | ICD-10-CM | POA: Diagnosis not present

## 2015-12-15 DIAGNOSIS — Z7982 Long term (current) use of aspirin: Secondary | ICD-10-CM | POA: Diagnosis not present

## 2015-12-15 DIAGNOSIS — I5032 Chronic diastolic (congestive) heart failure: Secondary | ICD-10-CM | POA: Diagnosis not present

## 2015-12-15 DIAGNOSIS — Z9989 Dependence on other enabling machines and devices: Secondary | ICD-10-CM

## 2015-12-15 DIAGNOSIS — R000 Tachycardia, unspecified: Secondary | ICD-10-CM | POA: Diagnosis not present

## 2015-12-15 DIAGNOSIS — N179 Acute kidney failure, unspecified: Secondary | ICD-10-CM | POA: Diagnosis not present

## 2015-12-15 DIAGNOSIS — E119 Type 2 diabetes mellitus without complications: Secondary | ICD-10-CM

## 2015-12-15 DIAGNOSIS — M797 Fibromyalgia: Secondary | ICD-10-CM | POA: Diagnosis not present

## 2015-12-15 DIAGNOSIS — I509 Heart failure, unspecified: Secondary | ICD-10-CM | POA: Diagnosis not present

## 2015-12-15 DIAGNOSIS — I10 Essential (primary) hypertension: Secondary | ICD-10-CM | POA: Diagnosis not present

## 2015-12-15 DIAGNOSIS — J45901 Unspecified asthma with (acute) exacerbation: Secondary | ICD-10-CM | POA: Diagnosis not present

## 2015-12-15 DIAGNOSIS — R6521 Severe sepsis with septic shock: Secondary | ICD-10-CM | POA: Diagnosis not present

## 2015-12-15 DIAGNOSIS — L89152 Pressure ulcer of sacral region, stage 2: Secondary | ICD-10-CM | POA: Diagnosis not present

## 2015-12-15 DIAGNOSIS — E872 Acidosis: Secondary | ICD-10-CM | POA: Diagnosis not present

## 2015-12-15 DIAGNOSIS — I110 Hypertensive heart disease with heart failure: Secondary | ICD-10-CM | POA: Diagnosis not present

## 2015-12-15 DIAGNOSIS — Z853 Personal history of malignant neoplasm of breast: Secondary | ICD-10-CM | POA: Diagnosis not present

## 2015-12-15 DIAGNOSIS — I5033 Acute on chronic diastolic (congestive) heart failure: Secondary | ICD-10-CM | POA: Diagnosis not present

## 2015-12-15 DIAGNOSIS — Z515 Encounter for palliative care: Secondary | ICD-10-CM | POA: Diagnosis not present

## 2015-12-15 DIAGNOSIS — J690 Pneumonitis due to inhalation of food and vomit: Secondary | ICD-10-CM | POA: Diagnosis not present

## 2015-12-15 DIAGNOSIS — E669 Obesity, unspecified: Secondary | ICD-10-CM | POA: Diagnosis present

## 2015-12-15 DIAGNOSIS — N390 Urinary tract infection, site not specified: Secondary | ICD-10-CM

## 2015-12-15 DIAGNOSIS — K7581 Nonalcoholic steatohepatitis (NASH): Secondary | ICD-10-CM | POA: Diagnosis not present

## 2015-12-15 DIAGNOSIS — D65 Disseminated intravascular coagulation [defibrination syndrome]: Secondary | ICD-10-CM | POA: Diagnosis not present

## 2015-12-15 DIAGNOSIS — I2699 Other pulmonary embolism without acute cor pulmonale: Secondary | ICD-10-CM | POA: Diagnosis not present

## 2015-12-15 DIAGNOSIS — G92 Toxic encephalopathy: Secondary | ICD-10-CM | POA: Diagnosis not present

## 2015-12-15 DIAGNOSIS — R918 Other nonspecific abnormal finding of lung field: Secondary | ICD-10-CM | POA: Diagnosis not present

## 2015-12-15 DIAGNOSIS — E039 Hypothyroidism, unspecified: Secondary | ICD-10-CM | POA: Diagnosis not present

## 2015-12-15 DIAGNOSIS — R9431 Abnormal electrocardiogram [ECG] [EKG]: Secondary | ICD-10-CM | POA: Diagnosis not present

## 2015-12-15 DIAGNOSIS — Z9221 Personal history of antineoplastic chemotherapy: Secondary | ICD-10-CM | POA: Diagnosis not present

## 2015-12-15 DIAGNOSIS — G9340 Encephalopathy, unspecified: Secondary | ICD-10-CM | POA: Diagnosis not present

## 2015-12-15 DIAGNOSIS — A419 Sepsis, unspecified organism: Secondary | ICD-10-CM | POA: Diagnosis not present

## 2015-12-15 LAB — RENAL FUNCTION PANEL
ALBUMIN: 1.9 g/dL — AB (ref 3.5–5.0)
ANION GAP: 12 (ref 5–15)
BUN: 25 mg/dL — AB (ref 6–20)
CHLORIDE: 94 mmol/L — AB (ref 101–111)
CO2: 32 mmol/L (ref 22–32)
Calcium: 8.5 mg/dL — ABNORMAL LOW (ref 8.9–10.3)
Creatinine, Ser: 0.92 mg/dL (ref 0.44–1.00)
Glucose, Bld: 93 mg/dL (ref 65–99)
PHOSPHORUS: 3.1 mg/dL (ref 2.5–4.6)
POTASSIUM: 3.8 mmol/L (ref 3.5–5.1)
Sodium: 138 mmol/L (ref 135–145)

## 2015-12-15 LAB — GLUCOSE, CAPILLARY: GLUCOSE-CAPILLARY: 102 mg/dL — AB (ref 65–99)

## 2015-12-15 MED ORDER — HYDRALAZINE HCL 25 MG PO TABS: 25.0000 mg | ORAL_TABLET | Freq: Three times a day (TID) | ORAL | Status: AC

## 2015-12-15 MED ORDER — INSULIN LISPRO PROT & LISPRO (75-25 MIX) 100 UNIT/ML KWIKPEN: 10.0000 [IU] | PEN_INJECTOR | Freq: Two times a day (BID) | SUBCUTANEOUS | Status: AC

## 2015-12-15 MED ORDER — GABAPENTIN 100 MG PO CAPS: 200.0000 mg | ORAL_CAPSULE | Freq: Two times a day (BID) | ORAL | Status: AC

## 2015-12-15 MED ORDER — SACCHAROMYCES BOULARDII 250 MG PO CAPS: 250.0000 mg | ORAL_CAPSULE | Freq: Two times a day (BID) | ORAL | Status: AC

## 2015-12-15 MED ORDER — GUAIFENESIN ER 600 MG PO TB12: 600.0000 mg | ORAL_TABLET | Freq: Two times a day (BID) | ORAL | Status: AC

## 2015-12-15 MED ORDER — AMOXICILLIN-POT CLAVULANATE 875-125 MG PO TABS: 1.0000 | ORAL_TABLET | Freq: Two times a day (BID) | ORAL | Status: AC

## 2015-12-15 MED ORDER — POTASSIUM CHLORIDE CRYS ER 20 MEQ PO TBCR: 20.0000 meq | EXTENDED_RELEASE_TABLET | Freq: Every day | ORAL | Status: AC

## 2015-12-15 MED ORDER — PREDNISONE 20 MG PO TABS: 20.0000 mg | ORAL_TABLET | Freq: Every day | ORAL | Status: AC

## 2015-12-15 MED ORDER — IPRATROPIUM-ALBUTEROL 0.5-2.5 (3) MG/3ML IN SOLN: 3.0000 mL | RESPIRATORY_TRACT | Status: AC | PRN

## 2015-12-15 MED ORDER — NADOLOL 20 MG PO TABS: 20.0000 mg | ORAL_TABLET | Freq: Every day | ORAL | Status: AC

## 2015-12-15 MED ORDER — SENNOSIDES-DOCUSATE SODIUM 8.6-50 MG PO TABS: 1.0000 | ORAL_TABLET | Freq: Every day | ORAL | Status: AC

## 2015-12-15 MED ORDER — ISOSORBIDE MONONITRATE ER 30 MG PO TB24: 30.0000 mg | ORAL_TABLET | Freq: Every day | ORAL | Status: AC

## 2015-12-15 MED ORDER — POLYETHYLENE GLYCOL 3350 17 G PO PACK: 17.0000 g | PACK | Freq: Every day | ORAL | Status: AC

## 2015-12-15 MED ORDER — FLUTICASONE PROPIONATE 50 MCG/ACT NA SUSP: 2.0000 | Freq: Every day | NASAL | Status: AC

## 2015-12-15 MED ORDER — IPRATROPIUM-ALBUTEROL 0.5-2.5 (3) MG/3ML IN SOLN: 3.0000 mL | RESPIRATORY_TRACT | Status: DC | PRN

## 2015-12-15 MED ORDER — MAGIC MOUTHWASH: 5.0000 mL | Freq: Four times a day (QID) | ORAL | Status: AC

## 2015-12-15 MED ORDER — PANTOPRAZOLE SODIUM 40 MG PO TBEC: 40.0000 mg | DELAYED_RELEASE_TABLET | Freq: Every day | ORAL | Status: AC

## 2015-12-15 NOTE — Progress Notes (Signed)
Tried calling report to [facility], however no one picked up when they transferred me. I will try calling again. Thanks. Rito Ehrlich Mads Borgmeyer 1:48 PM 12/15/2015

## 2015-12-15 NOTE — Progress Notes (Signed)
OT Cancellation Note  Patient Details Name: Gina Bender MRN: [id number] DOB: Oct 05, 1950   Cancelled Treatment:    Reason Eval/Treat Not Completed: Other (comment). Pt to transfer to SNF today will defer remainder of OT to that facility. If D/C delayed will re-attempt tx at later date.  Almon Register [id number] 12/15/2015, 12:19 PM

## 2015-12-15 NOTE — Clinical Social Work Note (Addendum)
Patient will discharge today per MD order. Patient will discharge to: [facility] RN to call report prior to transportation to: [phone] Room 308 Transportation: PTAR to be scheduled at [employer], LCSW [phone]  [id number], 2S 15-16 and Psych[facility] Service Line  Licensed Clinical Social Worker

## 2015-12-15 NOTE — Discharge Summary (Signed)
Physician Discharge Summary  Gina Bender [id number] DOB: May 23, 1951 DOA: 11/28/2015  PCP: Nyoka Cowden, MD  Admit date: 11/28/2015 Discharge date: 12/15/2015  Time spent: 35 minutes  Recommendations for Outpatient Follow-up:  1. Discharge to SNF 2. Completes 7 days of abx for aspiration PNA today . (12/15/2015) Oral prednisone taper over next 12 days. Needs outpt follow up with pulmonary with PFT and repeat CXR. PFT scheduled by pulmonary already. 3. Patient will be discharged on 2L o2 via [place] continuously. Please monitor for long term o2 requirement. 4. Please avoid any Qt prolonging medications. 5. CPAP settings - 8 cm.     Discharge Diagnoses:  Principal Problem:   Acute respiratory failure with hypoxia (HCC)   Active Problems:   Benign essential HTN   Extreme obesity (HCC)   Diabetes mellitus without complication (HCC)   Asthma exacerbation   UTI (lower urinary tract infection)   Diastolic dysfunction with acute on chronic heart failure (HCC)   Influenza A   Vocal cord dysfunction   Acute on chronic diastolic (congestive) heart failure (HCC)   Aspiration pneumonia of right lower lobe (HCC)   Acute encephalopathy   Asterixis   QT prolongation   OSA on CPAP   Discharge Condition: fair  Diet recommendation: heart healthy/ diabetic  Filed Weights   12/14/15 0700 12/15/15 0539 12/15/15 0549  Weight: 148.78 kg (328 lb) 148.326 kg (327 lb) 87.136 kg (192 lb 1.6 oz)    History of present illness:  Please refer to admission H&P for details, in brief, 65 year old morbidly obese female with history of vocal cord dysfunction, asthma, 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 Hospital course was prolonged due to acute kidney injury, positive influenza A and acute on  chronic diastolic CHF. Patient being treated for all these. Pulmonary following given her persistent respiratory symptoms. Overnight on 4/4-4/5 and had acute onset of right-sided weakness with slow speech. Code stroke was called. Did not receive TPA as onset of symptoms was not clear. Head CT and MRI brain was unremarkable. Chest x-ray showed worsened right lower lobe consolidation.  Hospital Course:  Acute hypoxic respiratory failure Combination of asthma exacerbation, influenza A, acute on chronic diastolic CHF and possible aspiration pneumonia. -Stable on 2 L O2 via nasal cannula.( not on o2 prior to admission, being discharged on it) . Slow improvement.  Diuresing well with aggressive IV Lasix.  Completed Tamiflu.. -Continue DuoNeb. Tapering prednisone. ( wean off 10 mg every 3 days) -Continue Mucinex and chest PT. Continue Flonase, flutter valve and incentive spirometry.  -Pulmonary signed off. Scheduled outpatient PFT, will schedule follow up as well..   Acute right-sided weakness with speech impairment on 4/5 CT angiogram head and neck and MRI been negative for stroke. Suspect due to aspiration and medications (high-dose gabapentin, dose now adjusted). No further symptoms.   Acute on chronic diastolic CHF Diuresed well with aggressive dose of IV Lasix. Dose reduced to 40 mg IV bid. (Negative balance of >11 L). Monitor strict I/O.  2-D echo shows normal EF with grade 1 diastolic dysfunction. Transition to po lasix ( home dose). Continue ARB.  added imdur and hydralazine. switched atenolol to nadolol.    ? Aspiration pneumonia Worsened right lower lobe consolidation seen on chest x-ray. On empiric Unasyn. Has vocal cord dysfunction as well. Added florostor on recent history of C. Difficile.. Switched antibiotics  to Augmentin, completes course today.  Influenza A Completed Tamiflu on 4/5  Prolonged QTC >600 Discontinued Paxil and Ditropan. Replenishing low potassium and  magnesium. Appreciate evaluation by EP. Recommends to keep her off medications that would prolong QTC. Added nadolol for rate control. Stable on monitor except for frequent PVCs. Plan to keep k>4 and mg >2. QTC improved . Avoid At prolonging agents   Obesity hypoventilation syndrome/OSA Continue nighttime CPAP. Setting of 8 cm. titrate as needed.  UTI Recent history of C. difficile. Received 3 days of IV Rocephin this admission.  Hypokalemia/hypomagnesemia  replenished.    diabetes mellitus type 2  Well controlled as outpatient (A1c of 6.1) . Neurontin for peripheral neuropathy.  Was getting 15 units lantus here. Will reduce her novlog dose to 10 u bid. Resume metformin.  Essential hypertension  atenolol was discontinued due to due to sinus bradycardia on 3/30. Now stable. Added nadolol given her CHF and prolonged Qtc. Added imdur and hydralazine. Continue ARB.    Hypothyroidism Continue Synthroid  Morbid obesity   Patient will need ongoing PT at SNF. Stable for discharge.    Family Communication: husband at bedside Disposition Plan: to SNF  Consultants:  Pulmonary  EP  Procedures:  2-D echo  Antibiotics:  Completed 3 days of IV Rocephin  IV Unasyn  4/5-4/8  Augmentin 4/8-until 4/11  Discharge Exam: Filed Vitals:   12/14/15 2348 12/15/15 0541  BP:  119/56  Pulse: 64 60  Temp:  97.7 F (36.5 C)  Resp: 16     General: Obese,fatigued  HEENT: moist mucosa,  no pallor, supple neck  Cardiovascular: NS1&S2, no Murmurs  Respiratory: Improved breath sounds bilaterally  Abdomen: soft, NT, ND, BS+,  Musculoskeletal: Improved edema b/l  Next: Alert and oriented     Discharge Instructions    Current Discharge Medication List    START taking these medications   Details  amoxicillin-clavulanate (AUGMENTIN) 875-125 MG tablet Take 1 tablet by mouth every 12 (twelve) hours. Qty: 2 tablet, Refills: 0    fluticasone (FLONASE) 50 MCG/ACT nasal  spray Place 2 sprays into both nostrils daily. Qty: 16 g, Refills: 2    guaiFENesin (MUCINEX) 600 MG 12 hr tablet Take 1 tablet (600 mg total) by mouth 2 (two) times daily. Qty: 15 tablet, Refills: 0    hydrALAZINE (APRESOLINE) 25 MG tablet Take 1 tablet (25 mg total) by mouth every 8 (eight) hours. Qty: 90 tablet, Refills: 0    ipratropium-albuterol (DUONEB) 0.5-2.5 (3) MG/3ML SOLN Take 3 mLs by nebulization every 4 (four) hours as needed. Qty: 360 mL, Refills: 0    isosorbide mononitrate (IMDUR) 30 MG 24 hr tablet Take 1 tablet (30 mg total) by mouth daily. Qty: 30 tablet, Refills: 0    magic mouthwash SOLN Take 5 mLs by mouth 4 (four) times daily. Qty: 50 mL, Refills: 0    nadolol (CORGARD) 20 MG tablet Take 1 tablet (20 mg total) by mouth daily. Qty: 30 tablet, Refills: 0    pantoprazole (PROTONIX) 40 MG tablet Take 1 tablet (40 mg total) by mouth daily. Qty: 30 tablet, Refills: 0    polyethylene glycol (MIRALAX / GLYCOLAX) packet Take 17 g by mouth daily. Qty: 14 each, Refills: 0    potassium chloride SA (K-DUR,KLOR-CON) 20 MEQ tablet Take 1 tablet (20 mEq total) by mouth daily. Qty: 10 tablet, Refills: 0    predniSONE (DELTASONE) 20 MG tablet Take 1 tablet (20 mg total) by mouth daily with breakfast. Qty: 16 tablet, Refills:  0    saccharomyces boulardii (FLORASTOR) 250 MG capsule Take 1 capsule (250 mg total) by mouth 2 (two) times daily. Qty: 20 capsule, Refills: 0    senna-docusate (SENOKOT-S) 8.6-50 MG tablet Take 1 tablet by mouth at bedtime. Qty: 15 tablet, Refills: 0      CONTINUE these medications which have CHANGED   Details  gabapentin (NEURONTIN) 100 MG capsule Take 2 capsules (200 mg total) by mouth 2 (two) times daily. Qty: 30 capsule, Refills: 0    Insulin Lispro Prot & Lispro (HUMALOG MIX 75/25 KWIKPEN) (75-25) 100 UNIT/ML Kwikpen Inject 10 Units into the skin 2 (two) times daily. Qty: 20 pen, Refills: 1      CONTINUE these medications which have  NOT CHANGED   Details  acetaminophen (TYLENOL) 500 MG tablet Take 500 mg by mouth every 6 (six) hours as needed for mild pain.    aspirin EC 81 MG tablet Take 81 mg by mouth daily.    bacitracin ophthalmic ointment Place 1 application into the left eye at bedtime. Reported on 11/16/2015    betamethasone valerate ointment (VALISONE) 0.1 % Apply a pea sized amount topically BID for up to 2 weeks Qty: 30 g, Refills: 0    calcium-vitamin D (OSCAL WITH D) 500-200 MG-UNIT per tablet Take 2 tablets by mouth daily with breakfast.    furosemide (LASIX) 40 MG tablet Take 1 tablet (40 mg total) by mouth daily. Qty: 30 tablet, Refills: 3    !! glucose blood (FREESTYLE LITE) test strip 100 each by Does not apply route.    !! glucose blood (FREESTYLE LITE) test strip by Does not apply route.    ibuprofen (ADVIL,MOTRIN) 200 MG tablet Take 200 mg by mouth every 8 (eight) hours as needed for mild pain.    Insulin Pen Needle (PRODIGY MINI PEN NEEDLES) 31G X 5 MM MISC USE 2 TIMES A DAY    levothyroxine (SYNTHROID, LEVOTHROID) 175 MCG tablet Take 1 tablet (175 mcg total) by mouth daily. Qty: 90 tablet, Refills: 1    losartan (COZAAR) 100 MG tablet Take 1 tablet (100 mg total) by mouth daily. Qty: 90 tablet, Refills: 1    metFORMIN (GLUCOPHAGE) 1000 MG tablet Take 1 tablet (1,000 mg total) by mouth 2 (two) times daily. Qty: 180 tablet, Refills: 1    Multiple Vitamins-Minerals (MULTIVITAMIN WITH MINERALS) tablet Take 1 tablet by mouth daily.    Omega-3 Fatty Acids (OMEGA 3 PO) Take 1 tablet by mouth daily.    oxybutynin (DITROPAN) 5 MG tablet Take 2 tablets (10 mg total) by mouth 2 (two) times daily. Qty: 360 tablet, Refills: 1    VENTOLIN HFA 108 (90 Base) MCG/ACT inhaler Inhale 2 puffs into the lungs every 6 (six) hours as needed. Shortness of breath Qty: 1 Inhaler, Refills: 5     !! - Potential duplicate medications found. Please discuss with provider.    STOP taking these medications      atenolol (TENORMIN) 50 MG tablet      cloNIDine (CATAPRES) 0.1 MG tablet      metroNIDAZOLE (FLAGYL) 500 MG tablet      PARoxetine (PAXIL) 40 MG tablet      tiZANidine (ZANAFLEX) 2 MG tablet        Allergies  Allergen Reactions  . Hydrocodone-Acetaminophen     REACTION: nausea and vomiting  . Latex Dermatitis  . Oxycodone-Acetaminophen     REACTION: hives, nausea and vomiting  . Pollen Extract   . Succinylcholine  REACTION: heart stopped   Follow-up Information    Follow up with [facility] .   Specialty:  [place] information:   [place] [facility] [phone]      Follow up with Magdalen Spatz, NP On 01/27/2016.   Specialty:  Pulmonary Disease   Why:  pulmonary function test 3pm -- Eric Form NP 4pm    Contact information:   [address] [place] [address] [phone]        The results of significant diagnostics from this hospitalization (including imaging, microbiology, ancillary and laboratory) are listed below for reference.    Significant Diagnostic Studies: Ct Angio Head W/cm &/or Wo Cm  12/08/2015  CLINICAL DATA:  Initial evaluation for acute right-sided weakness. EXAM: CT ANGIOGRAPHY HEAD AND NECK TECHNIQUE: Multidetector CT imaging of the head and neck was performed using the standard protocol during bolus administration of intravenous contrast. Multiplanar CT image reconstructions and MIPs were obtained to evaluate the vascular anatomy. Carotid stenosis measurements (when applicable) are obtained utilizing NASCET criteria, using the distal internal carotid diameter as the denominator. CONTRAST:  50 cc of Isovue 370. COMPARISON:  Prior head CT from earlier the same day. FINDINGS: CTA NECK Aortic arch:  Study limited by timing of the contrast bolus. Visualized aortic arch of normal caliber with normal branch pattern. No high-grade stenosis at the origin of the  great vessels. Visualized subclavian arteries patent. Right carotid system: The right common carotid artery patent from its origin to the bifurcation. No significant atheromatous plaque about the right carotid bifurcation. Right ICA patent from the bifurcation to the skullbase without stenosis, dissection, or occlusion. Mild tortuosity of the proximal right ICA. Left carotid system: Left common carotid artery patent from its origin to the bifurcation. No significant atheromatous plaque about the left carotid bifurcation. Left ICA patent from the bifurcation to the skullbase without stenosis, dissection, or vascular occlusion. Tortuosity the proximal left ICA. Vertebral arteries:Both of the vertebral arteries arise from the subclavian arteries. Left vertebral artery is dominant. Left vertebral artery widely patent to the skullbase without stenosis, dissection, or occlusion. The diminutive right vertebral artery is grossly patent, but somewhat poorly evaluated on this exam due to size and time with contrast bolus. Skeleton: Moderate degenerative spondylolysis at C4-5 and C5-6. Degenerative changes about the C1-2 articulation. No acute osseous abnormality. Other neck: Layering right pleural effusion partially visualized. Probable smaller left pleural effusion. Scattered patchy opacity within the partially visualized left lung, which may reflect acute infectious pneumonitis. Minimal scattered patchy opacity within the right lung as well. Visualized superior mediastinum within normal limits. No acute soft tissue abnormality within the neck. No adenopathy. CTA HEAD Anterior circulation: Petrous, cavernous, and supraclinoid segments patent without flow-limiting stenosis. A1 segments, anterior communicating artery, and anterior cerebral arteries patent. M1 segments patent without stenosis or occlusion. MCA bifurcations normal. No proximal M2 branch occlusion appreciated. Distal MCA branches not well evaluated on this exam  due to timing of contrast bolus. Dx Posterior circulation: Vertebral arteries patent to the vertebrobasilar junction. Left vertebral artery dominant. The diminutive right V4 segment not well visualized, and may terminate and high calf. Posterior inferior cerebral arteries not well evaluated on this exam. Basilar artery widely patent to its distal aspect. Superior cerebellar arteries patent bilaterally. Both the posterior cerebral arteries arise from the basilar artery and are well opacified to their distal aspects. Venous sinuses: Not well evaluated on this exam. Anatomic  variants: None.  No aneurysm. Delayed phase: No definite abnormal enhancement, although examination fairly limited due to timing of the contrast bolus. IMPRESSION: 1. Negative CTA of the head and neck. No large or proximal arterial branch occlusion. No high-grade or correctable stenosis. 2. Layering bilateral pleural effusions, right greater than left. 3. Multi focal patchy airspace opacities within the partially visualized lungs, nonspecific, but may reflect multi focal infection. Critical Value/emergent results were called by telephone at the time of interpretation on 12/08/2015 at 11:11 pm to Dr. Norman Clay , who verbally acknowledged these results. Electronically Signed   By: Jeannine Boga M.D.   On: 12/08/2015 23:23   Dg Chest 2 View  12/04/2015  CLINICAL DATA:  Asthma exacerbation and shortness of breath. EXAM: CHEST  2 VIEW COMPARISON:  11/29/2015 FINDINGS: Stable asymmetric elevation right hemidiaphragm. Interval increase in right base atelectasis. Left lung is stable with chronic atelectasis or scarring in the midlung. No edema or focal airspace consolidation. Question tiny right pleural effusion. Surgical clips are noted in the left breast. Patient is status post vertebral augmentation at L1. Telemetry leads overlie the chest. IMPRESSION: Low volume film of asymmetric elevation right hemidiaphragm and right base  atelectasis. Possible tiny right pleural effusion. Electronically Signed   By: Misty Stanley M.D.   On: 12/04/2015 16:51   Dg Chest 2 View  11/29/2015  CLINICAL DATA:  Shortness of breath. EXAM: CHEST  2 VIEW COMPARISON:  None. FINDINGS: Cardiac size upper limits normal for degree of inspiration. Surgical clips in the chest wall and axilla on the LEFT. No active infiltrates or failure. Similar appearance to yesterday's radiograph. IMPRESSION: Poor inspiration.  No definite active process. Electronically Signed   By: Staci Righter M.D.   On: 11/29/2015 15:08   Dg Chest 2 View  11/28/2015  CLINICAL DATA:  Shortness of breath tonight. Hypertension, diabetes, nonsmoker. History of COPD. EXAM: CHEST  2 VIEW COMPARISON:  None. FINDINGS: Shallow inspiration. Heart size and pulmonary vascularity are normal for technique. No focal airspace disease or consolidation in the lungs. No blunting of the costophrenic angles. No pneumothorax. Surgical clips in the chest wall and axilla on the left. Tortuous aorta. IMPRESSION: No active cardiopulmonary disease. Electronically Signed   By: Lucienne Capers M.D.   On: 11/28/2015 01:21   Ct Head Wo Contrast  12/08/2015  CLINICAL DATA:  Initial evaluation for acute right-sided weakness. EXAM: CT HEAD WITHOUT CONTRAST TECHNIQUE: Contiguous axial images were obtained from the base of the skull through the vertex without intravenous contrast. COMPARISON:  None available. FINDINGS: Cerebral volume within normal limits. Mild chronic small vessel ischemic disease within the periventricular white matter. No acute large vessel territory infarct. No intracranial hemorrhage. No mass lesion, midline shift, or mass effect. No hydrocephalus. No extra-axial fluid collection. Scalp soft tissues within normal limits. No acute abnormality about the orbits. Minimal opacity within the left sphenoid sinus. Paranasal sinuses are otherwise clear. No mastoid effusion. Calvarium intact. IMPRESSION: 1. No  acute intracranial process. 2. Mild chronic small vessel ischemic disease. Please note that this study only became available for review at approximately 10:40 p.m. on 12/08/2015. Electronically Signed   By: Jeannine Boga M.D.   On: 12/08/2015 22:52   Ct Angio Neck W/cm &/or Wo/cm  12/08/2015  CLINICAL DATA:  Initial evaluation for acute right-sided weakness. EXAM: CT ANGIOGRAPHY HEAD AND NECK TECHNIQUE: Multidetector CT imaging of the head and neck was performed using the standard protocol during bolus administration of intravenous contrast. Multiplanar CT  image reconstructions and MIPs were obtained to evaluate the vascular anatomy. Carotid stenosis measurements (when applicable) are obtained utilizing NASCET criteria, using the distal internal carotid diameter as the denominator. CONTRAST:  50 cc of Isovue 370. COMPARISON:  Prior head CT from earlier the same day. FINDINGS: CTA NECK Aortic arch:  Study limited by timing of the contrast bolus. Visualized aortic arch of normal caliber with normal branch pattern. No high-grade stenosis at the origin of the great vessels. Visualized subclavian arteries patent. Right carotid system: The right common carotid artery patent from its origin to the bifurcation. No significant atheromatous plaque about the right carotid bifurcation. Right ICA patent from the bifurcation to the skullbase without stenosis, dissection, or occlusion. Mild tortuosity of the proximal right ICA. Left carotid system: Left common carotid artery patent from its origin to the bifurcation. No significant atheromatous plaque about the left carotid bifurcation. Left ICA patent from the bifurcation to the skullbase without stenosis, dissection, or vascular occlusion. Tortuosity the proximal left ICA. Vertebral arteries:Both of the vertebral arteries arise from the subclavian arteries. Left vertebral artery is dominant. Left vertebral artery widely patent to the skullbase without stenosis,  dissection, or occlusion. The diminutive right vertebral artery is grossly patent, but somewhat poorly evaluated on this exam due to size and time with contrast bolus. Skeleton: Moderate degenerative spondylolysis at C4-5 and C5-6. Degenerative changes about the C1-2 articulation. No acute osseous abnormality. Other neck: Layering right pleural effusion partially visualized. Probable smaller left pleural effusion. Scattered patchy opacity within the partially visualized left lung, which may reflect acute infectious pneumonitis. Minimal scattered patchy opacity within the right lung as well. Visualized superior mediastinum within normal limits. No acute soft tissue abnormality within the neck. No adenopathy. CTA HEAD Anterior circulation: Petrous, cavernous, and supraclinoid segments patent without flow-limiting stenosis. A1 segments, anterior communicating artery, and anterior cerebral arteries patent. M1 segments patent without stenosis or occlusion. MCA bifurcations normal. No proximal M2 branch occlusion appreciated. Distal MCA branches not well evaluated on this exam due to timing of contrast bolus. Dx Posterior circulation: Vertebral arteries patent to the vertebrobasilar junction. Left vertebral artery dominant. The diminutive right V4 segment not well visualized, and may terminate and high calf. Posterior inferior cerebral arteries not well evaluated on this exam. Basilar artery widely patent to its distal aspect. Superior cerebellar arteries patent bilaterally. Both the posterior cerebral arteries arise from the basilar artery and are well opacified to their distal aspects. Venous sinuses: Not well evaluated on this exam. Anatomic variants: None.  No aneurysm. Delayed phase: No definite abnormal enhancement, although examination fairly limited due to timing of the contrast bolus. IMPRESSION: 1. Negative CTA of the head and neck. No large or proximal arterial branch occlusion. No high-grade or correctable  stenosis. 2. Layering bilateral pleural effusions, right greater than left. 3. Multi focal patchy airspace opacities within the partially visualized lungs, nonspecific, but may reflect multi focal infection. Critical Value/emergent results were called by telephone at the time of interpretation on 12/08/2015 at 11:11 pm to Dr. Norman Clay , who verbally acknowledged these results. Electronically Signed   By: Jeannine Boga M.D.   On: 12/08/2015 23:23   Mr Brain Wo Contrast  12/09/2015  CLINICAL DATA:  Initial evaluation for acute right-sided weakness. EXAM: MRI HEAD WITHOUT CONTRAST TECHNIQUE: Multiplanar, multiecho pulse sequences of the brain and surrounding structures were obtained without intravenous contrast. COMPARISON:  Prior CTA from 12/08/2015. FINDINGS: Study mildly degraded by motion artifact. Mild diffuse prominence of the CSF  containing spaces compatible with generalized cerebral atrophy. Mild chronic small vessel ischemic type changes present within the periventricular white matter, within normal limits for patient age. No abnormal foci of restricted diffusion to suggest acute infarct. Gray-white matter defect a shin maintained. Major intracranial vascular flow voids are preserved. No acute or chronic intracranial hemorrhage. No areas of chronic infarction. No mass lesion, midline shift, or mass effect. No hydrocephalus. No extra-axial fluid collection. Major dural sinuses are patent. Craniocervical junction within normal limits. Visualized upper cervical spine unremarkable. Pituitary gland normal. No acute abnormality about the orbits. Sequela prior bilateral lens extraction noted. Mild scattered mucosal thickening within the ethmoidal air cells. Paranasal sinuses are otherwise clear. No mastoid effusion. Ear structures grossly normal. Bone marrow signal intensity within normal limits. No scalp soft tissue abnormality. IMPRESSION: Negative brain MRI. No acute intracranial infarct or other  abnormality identified. Electronically Signed   By: Jeannine Boga M.D.   On: 12/09/2015 05:40   Dg Chest Port 1 View  12/10/2015  CLINICAL DATA:  Respiratory failure, shortness of breath, cough EXAM: PORTABLE CHEST 1 VIEW COMPARISON:  Portable chest x-ray of 12/09/2015, 12/04/2015, and 11/28/2015 FINDINGS: Opacity remains at the right lung base most consistent with atelectasis and elevation of the right hemidiaphragm. If further assessment is warranted, CT of the chest with IV contrast would be recommended. The left lung is clear. Heart size is stable. IMPRESSION: No change in opacity at the right lung base most consistent with atelectasis and elevation of the right hemidiaphragm. Electronically Signed   By: Ivar Drape M.D.   On: 12/10/2015 08:15   Dg Chest Port 1 View  12/09/2015  CLINICAL DATA:  Leukocytosis.  Cough and fever EXAM: PORTABLE CHEST 1 VIEW COMPARISON:  12/05/2015 FINDINGS: Progressive consolidation in the right lower lobe. This may be due to atelectasis and/or pneumonia. There is an element of volume loss. Left lung remains clear without infiltrate or effusion. Negative for heart failure. IMPRESSION: Progression of right lower lobe consolidation. Electronically Signed   By: Franchot Gallo M.D.   On: 12/09/2015 08:01   Dg Chest Port 1 View  12/05/2015  CLINICAL DATA:  Acute onset of generalized chest discomfort. Initial encounter. EXAM: PORTABLE CHEST 1 VIEW COMPARISON:  Chest radiograph performed 12/04/2015 FINDINGS: There is elevation of the right hemidiaphragm. Mild bibasilar opacities may reflect atelectasis or possibly mild pneumonia. Mild vascular crowding is noted. No pleural effusion or pneumothorax is seen. The cardiomediastinal silhouette is borderline normal in size. No acute osseous abnormalities are identified. Scattered clips are seen overlying the left axilla. IMPRESSION: Elevation of the right hemidiaphragm. Mild bibasilar opacities may reflect atelectasis or possibly  mild pneumonia. Electronically Signed   By: Garald Balding M.D.   On: 12/05/2015 03:20   Dg Hip Unilat With Pelvis 2-3 Views Right  11/28/2015  CLINICAL DATA:  65 year old female with fall and right hip pain. EXAM: DG HIP (WITH OR WITHOUT PELVIS) 2-3V RIGHT COMPARISON:  None. FINDINGS: There is no acute fracture or dislocation. The bones are osteopenic which limits evaluation for fracture. 50 the soft tissues are grossly unremarkable. IMPRESSION: No acute fracture or dislocation. Electronically Signed   By: Anner Crete M.D.   On: 11/28/2015 03:00    Microbiology: No results found for this or any previous visit (from the past 240 hour(s)).   Labs: Basic Metabolic Panel:  Recent Labs Lab 12/10/15 0701 12/11/15 0352 12/12/15 0536 12/13/15 0616 12/14/15 0406 12/15/15 0659  NA 134* 135 138 137 139 138  K 3.3* 3.7 3.4* 3.6 3.6 3.8  CL 89* 91* 93* 92* 94* 94*  CO2 30 33* 34* 35* 35* 32  GLUCOSE 162* 102* 80 84 113* 93  BUN 31* 30* 27* 30* 27* 25*  CREATININE 1.00 0.96 0.97 0.94 0.96 0.92  CALCIUM 7.7* 7.5* 7.8* 7.9* 8.2* 8.5*  MG 1.6* 2.0 1.7 1.9 2.0  --   PHOS 3.5 3.1 3.3 3.0 3.2 3.1   Liver Function Tests:  Recent Labs Lab 12/09/15  12/11/15 0352 12/12/15 0536 12/13/15 0616 12/14/15 0406 12/15/15 0659  AST 76*  --   --   --   --   --   --   ALT 55*  --   --   --   --   --   --   ALKPHOS 123  --   --   --   --   --   --   BILITOT 2.8*  --   --   --   --   --   --   PROT 5.6*  --   --   --   --   --   --   ALBUMIN 1.9*  < > 1.8* 1.8* 1.8* 1.9* 1.9*  < > = values in this interval not displayed. No results for input(s): LIPASE, AMYLASE in the last 168 hours. No results for input(s): AMMONIA in the last 168 hours. CBC:  Recent Labs Lab 12/09/15 12/12/15 0536  WBC 13.2* 10.4  NEUTROABS 11.1*  --   HGB 10.2* 10.8*  HCT 30.0* 33.1*  MCV 86.7 89.5  PLT 118* 136*   Cardiac Enzymes:  Recent Labs Lab 12/08/15 1318  TROPONINI 0.03   BNP: BNP (last 3  results)  Recent Labs  11/28/15 0204  BNP 256.3*    ProBNP (last 3 results) No results for input(s): PROBNP in the last 8760 hours.  CBG:  Recent Labs Lab 12/14/15 0640 12/14/15 1238 12/14/15 1654 12/14/15 2233 12/15/15 0703  GLUCAP 105* 129* 126* 126* 102*       Signed:  Louellen Molder MD.  [facility] 12/15/2015, 11:38 AM

## 2015-12-16 LAB — GLUCOSE, CAPILLARY: Glucose-Capillary: 138 mg/dL — ABNORMAL HIGH (ref 65–99)

## 2015-12-17 DIAGNOSIS — I5032 Chronic diastolic (congestive) heart failure: Secondary | ICD-10-CM | POA: Diagnosis not present

## 2015-12-17 DIAGNOSIS — J690 Pneumonitis due to inhalation of food and vomit: Secondary | ICD-10-CM | POA: Diagnosis not present

## 2015-12-17 DIAGNOSIS — J45901 Unspecified asthma with (acute) exacerbation: Secondary | ICD-10-CM | POA: Diagnosis not present

## 2015-12-17 DIAGNOSIS — G4733 Obstructive sleep apnea (adult) (pediatric): Secondary | ICD-10-CM | POA: Diagnosis not present

## 2015-12-17 DIAGNOSIS — R1310 Dysphagia, unspecified: Secondary | ICD-10-CM | POA: Diagnosis not present

## 2015-12-21 DIAGNOSIS — J9600 Acute respiratory failure, unspecified whether with hypoxia or hypercapnia: Secondary | ICD-10-CM | POA: Diagnosis not present

## 2015-12-21 DIAGNOSIS — I5033 Acute on chronic diastolic (congestive) heart failure: Secondary | ICD-10-CM | POA: Diagnosis not present

## 2015-12-21 DIAGNOSIS — G8191 Hemiplegia, unspecified affecting right dominant side: Secondary | ICD-10-CM | POA: Diagnosis not present

## 2015-12-23 DIAGNOSIS — G4733 Obstructive sleep apnea (adult) (pediatric): Secondary | ICD-10-CM | POA: Diagnosis not present

## 2015-12-23 DIAGNOSIS — J9600 Acute respiratory failure, unspecified whether with hypoxia or hypercapnia: Secondary | ICD-10-CM | POA: Diagnosis not present

## 2015-12-23 DIAGNOSIS — I5033 Acute on chronic diastolic (congestive) heart failure: Secondary | ICD-10-CM | POA: Diagnosis not present

## 2015-12-24 DIAGNOSIS — E119 Type 2 diabetes mellitus without complications: Secondary | ICD-10-CM | POA: Diagnosis not present

## 2015-12-24 DIAGNOSIS — R079 Chest pain, unspecified: Secondary | ICD-10-CM | POA: Diagnosis not present

## 2015-12-24 DIAGNOSIS — E8809 Other disorders of plasma-protein metabolism, not elsewhere classified: Secondary | ICD-10-CM | POA: Diagnosis not present

## 2015-12-24 DIAGNOSIS — R000 Tachycardia, unspecified: Secondary | ICD-10-CM | POA: Diagnosis not present

## 2015-12-24 DIAGNOSIS — Z959 Presence of cardiac and vascular implant and graft, unspecified: Secondary | ICD-10-CM | POA: Diagnosis not present

## 2015-12-24 DIAGNOSIS — R9431 Abnormal electrocardiogram [ECG] [EKG]: Secondary | ICD-10-CM | POA: Diagnosis not present

## 2015-12-24 DIAGNOSIS — R4182 Altered mental status, unspecified: Secondary | ICD-10-CM | POA: Diagnosis not present

## 2015-12-24 DIAGNOSIS — Z9889 Other specified postprocedural states: Secondary | ICD-10-CM | POA: Diagnosis not present

## 2015-12-24 DIAGNOSIS — Z515 Encounter for palliative care: Secondary | ICD-10-CM | POA: Diagnosis not present

## 2015-12-24 DIAGNOSIS — R251 Tremor, unspecified: Secondary | ICD-10-CM | POA: Diagnosis not present

## 2015-12-24 DIAGNOSIS — R5383 Other fatigue: Secondary | ICD-10-CM | POA: Diagnosis not present

## 2015-12-24 DIAGNOSIS — K7581 Nonalcoholic steatohepatitis (NASH): Secondary | ICD-10-CM | POA: Diagnosis not present

## 2015-12-24 DIAGNOSIS — I2699 Other pulmonary embolism without acute cor pulmonale: Secondary | ICD-10-CM | POA: Diagnosis not present

## 2015-12-24 DIAGNOSIS — I5030 Unspecified diastolic (congestive) heart failure: Secondary | ICD-10-CM | POA: Diagnosis not present

## 2015-12-24 DIAGNOSIS — G92 Toxic encephalopathy: Secondary | ICD-10-CM | POA: Diagnosis not present

## 2015-12-24 DIAGNOSIS — Z923 Personal history of irradiation: Secondary | ICD-10-CM | POA: Diagnosis not present

## 2015-12-24 DIAGNOSIS — K7460 Unspecified cirrhosis of liver: Secondary | ICD-10-CM | POA: Diagnosis not present

## 2015-12-24 DIAGNOSIS — I491 Atrial premature depolarization: Secondary | ICD-10-CM | POA: Diagnosis not present

## 2015-12-24 DIAGNOSIS — R6521 Severe sepsis with septic shock: Secondary | ICD-10-CM | POA: Diagnosis not present

## 2015-12-24 DIAGNOSIS — N179 Acute kidney failure, unspecified: Secondary | ICD-10-CM | POA: Diagnosis not present

## 2015-12-24 DIAGNOSIS — G9340 Encephalopathy, unspecified: Secondary | ICD-10-CM | POA: Diagnosis not present

## 2015-12-24 DIAGNOSIS — A419 Sepsis, unspecified organism: Secondary | ICD-10-CM | POA: Diagnosis not present

## 2015-12-24 DIAGNOSIS — Z7982 Long term (current) use of aspirin: Secondary | ICD-10-CM | POA: Diagnosis not present

## 2015-12-24 DIAGNOSIS — L89152 Pressure ulcer of sacral region, stage 2: Secondary | ICD-10-CM | POA: Diagnosis not present

## 2015-12-24 DIAGNOSIS — J9600 Acute respiratory failure, unspecified whether with hypoxia or hypercapnia: Secondary | ICD-10-CM | POA: Diagnosis not present

## 2015-12-24 DIAGNOSIS — I493 Ventricular premature depolarization: Secondary | ICD-10-CM | POA: Diagnosis not present

## 2015-12-24 DIAGNOSIS — I5033 Acute on chronic diastolic (congestive) heart failure: Secondary | ICD-10-CM | POA: Diagnosis not present

## 2015-12-24 DIAGNOSIS — J9602 Acute respiratory failure with hypercapnia: Secondary | ICD-10-CM | POA: Diagnosis not present

## 2015-12-24 DIAGNOSIS — D65 Disseminated intravascular coagulation [defibrination syndrome]: Secondary | ICD-10-CM | POA: Diagnosis not present

## 2015-12-24 DIAGNOSIS — Z4682 Encounter for fitting and adjustment of non-vascular catheter: Secondary | ICD-10-CM | POA: Diagnosis not present

## 2015-12-24 DIAGNOSIS — I110 Hypertensive heart disease with heart failure: Secondary | ICD-10-CM | POA: Diagnosis not present

## 2015-12-24 DIAGNOSIS — I509 Heart failure, unspecified: Secondary | ICD-10-CM | POA: Diagnosis not present

## 2015-12-24 DIAGNOSIS — E039 Hypothyroidism, unspecified: Secondary | ICD-10-CM | POA: Diagnosis not present

## 2015-12-24 DIAGNOSIS — Z66 Do not resuscitate: Secondary | ICD-10-CM | POA: Diagnosis not present

## 2015-12-24 DIAGNOSIS — R9401 Abnormal electroencephalogram [EEG]: Secondary | ICD-10-CM | POA: Diagnosis not present

## 2015-12-24 DIAGNOSIS — E2740 Unspecified adrenocortical insufficiency: Secondary | ICD-10-CM | POA: Diagnosis not present

## 2015-12-24 DIAGNOSIS — E7220 Disorder of urea cycle metabolism, unspecified: Secondary | ICD-10-CM | POA: Diagnosis not present

## 2015-12-24 DIAGNOSIS — M797 Fibromyalgia: Secondary | ICD-10-CM | POA: Diagnosis not present

## 2015-12-24 DIAGNOSIS — R402431 Glasgow coma scale score 3-8, in the field [EMT or ambulance]: Secondary | ICD-10-CM | POA: Diagnosis not present

## 2015-12-24 DIAGNOSIS — R0602 Shortness of breath: Secondary | ICD-10-CM | POA: Diagnosis not present

## 2015-12-24 DIAGNOSIS — G4733 Obstructive sleep apnea (adult) (pediatric): Secondary | ICD-10-CM | POA: Diagnosis not present

## 2015-12-24 DIAGNOSIS — Z853 Personal history of malignant neoplasm of breast: Secondary | ICD-10-CM | POA: Diagnosis not present

## 2015-12-24 DIAGNOSIS — I5032 Chronic diastolic (congestive) heart failure: Secondary | ICD-10-CM | POA: Diagnosis not present

## 2015-12-24 DIAGNOSIS — E872 Acidosis: Secondary | ICD-10-CM | POA: Diagnosis not present

## 2015-12-24 DIAGNOSIS — R401 Stupor: Secondary | ICD-10-CM | POA: Diagnosis not present

## 2015-12-24 DIAGNOSIS — J9601 Acute respiratory failure with hypoxia: Secondary | ICD-10-CM | POA: Diagnosis not present

## 2015-12-24 DIAGNOSIS — Z9221 Personal history of antineoplastic chemotherapy: Secondary | ICD-10-CM | POA: Diagnosis not present

## 2015-12-24 DIAGNOSIS — Z794 Long term (current) use of insulin: Secondary | ICD-10-CM | POA: Diagnosis not present

## 2015-12-24 DIAGNOSIS — R918 Other nonspecific abnormal finding of lung field: Secondary | ICD-10-CM | POA: Diagnosis not present

## 2015-12-27 ENCOUNTER — Other Ambulatory Visit: Payer: Self-pay | Admitting: Internal Medicine

## 2016-01-04 DEATH — deceased

## 2016-01-25 ENCOUNTER — Telehealth: Payer: Self-pay | Admitting: Obstetrics and Gynecology

## 2016-01-25 ENCOUNTER — Telehealth: Payer: Self-pay | Admitting: Internal Medicine

## 2016-01-25 NOTE — Telephone Encounter (Signed)
Pt husband called and said pt passed away on 2016/01/21

## 2016-01-27 ENCOUNTER — Inpatient Hospital Stay: Payer: Medicare Other | Admitting: Internal Medicine

## 2016-02-24 ENCOUNTER — Ambulatory Visit: Payer: Medicare Other | Admitting: Internal Medicine

## 2016-12-16 NOTE — Telephone Encounter (Signed)
Made in error pleas disregard

## 2017-08-16 IMAGING — CR DG HIP (WITH OR WITHOUT PELVIS) 2-3V*R*
3 series · 3 of 3 positions shown · non-contrast
Comparison: None.

CLINICAL DATA: 64-year-old female with fall and right hip pain.

EXAM:
DG HIP (WITH OR WITHOUT PELVIS) 2-3V RIGHT

[pelvis ap]
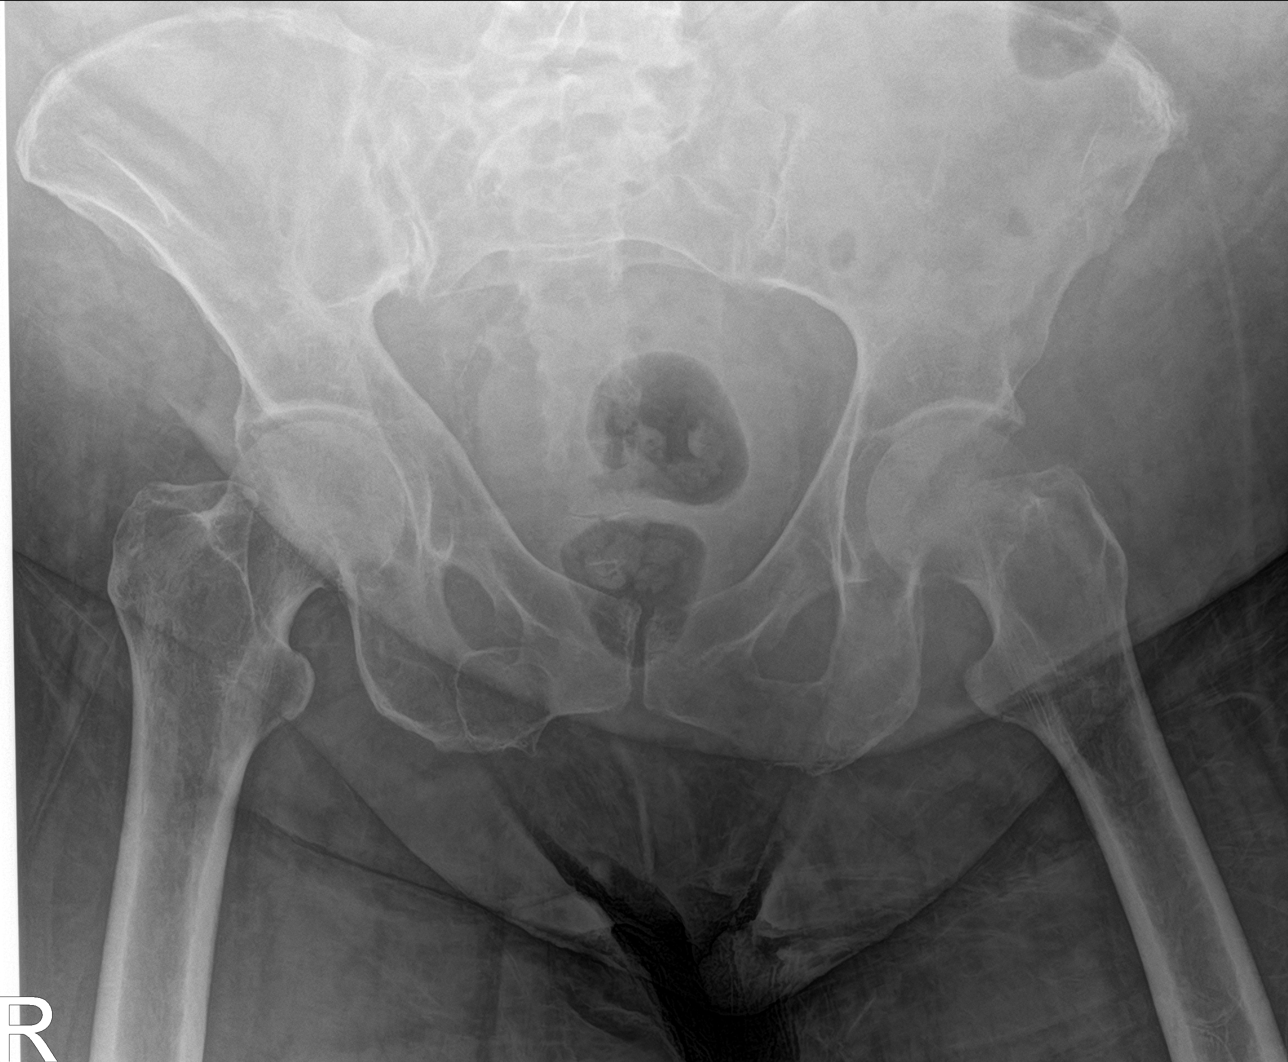

[hip ap]
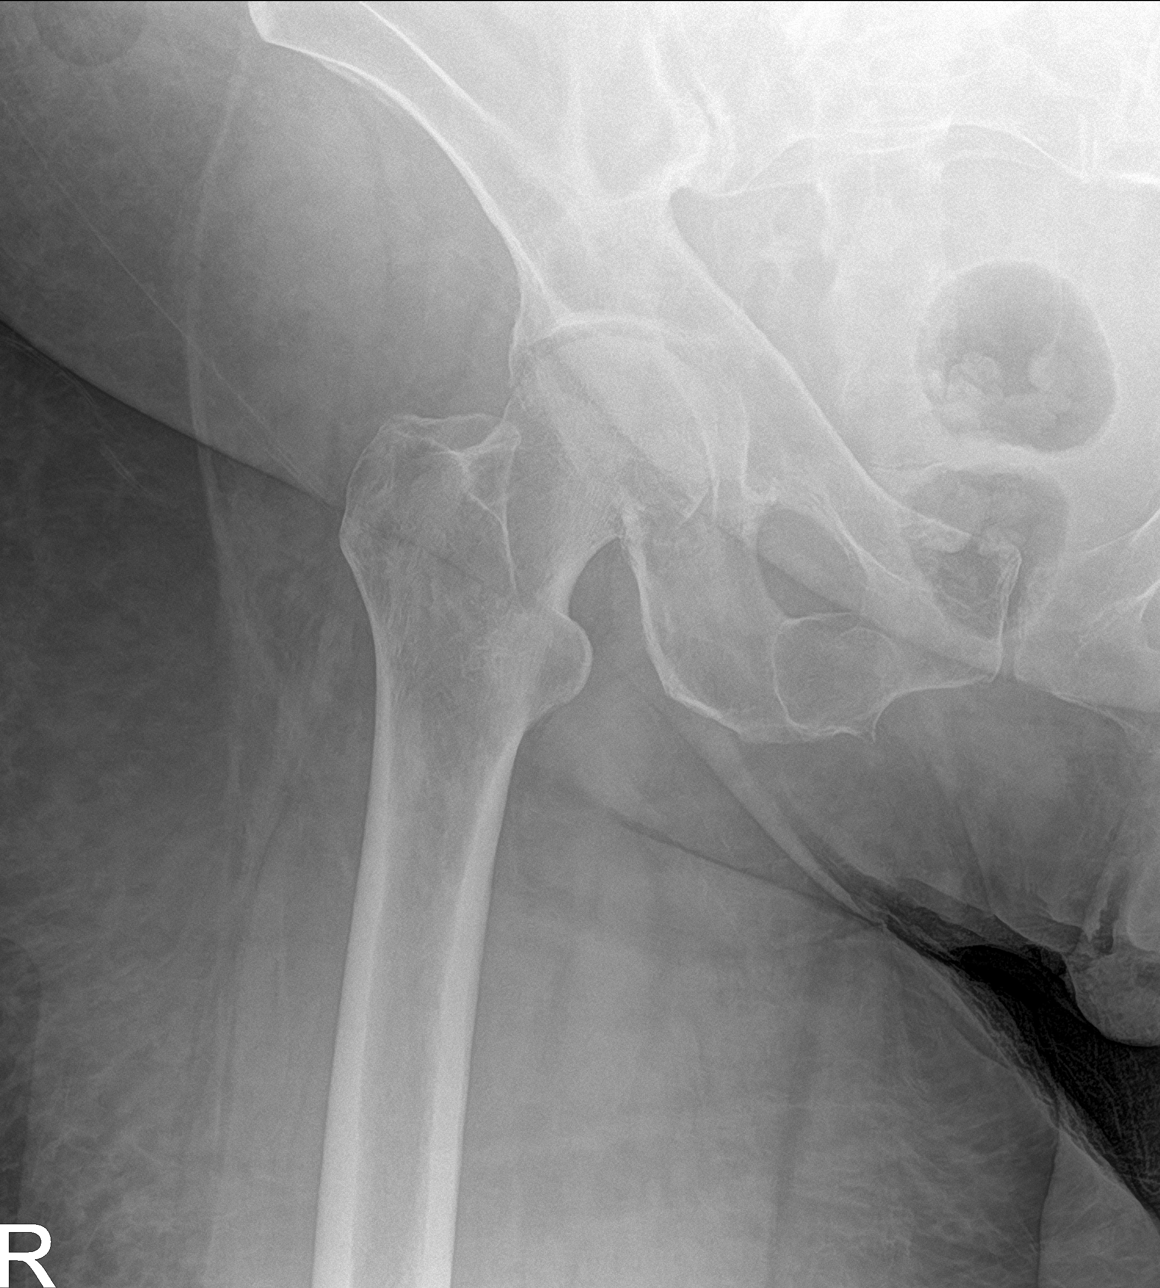

[hip lat]
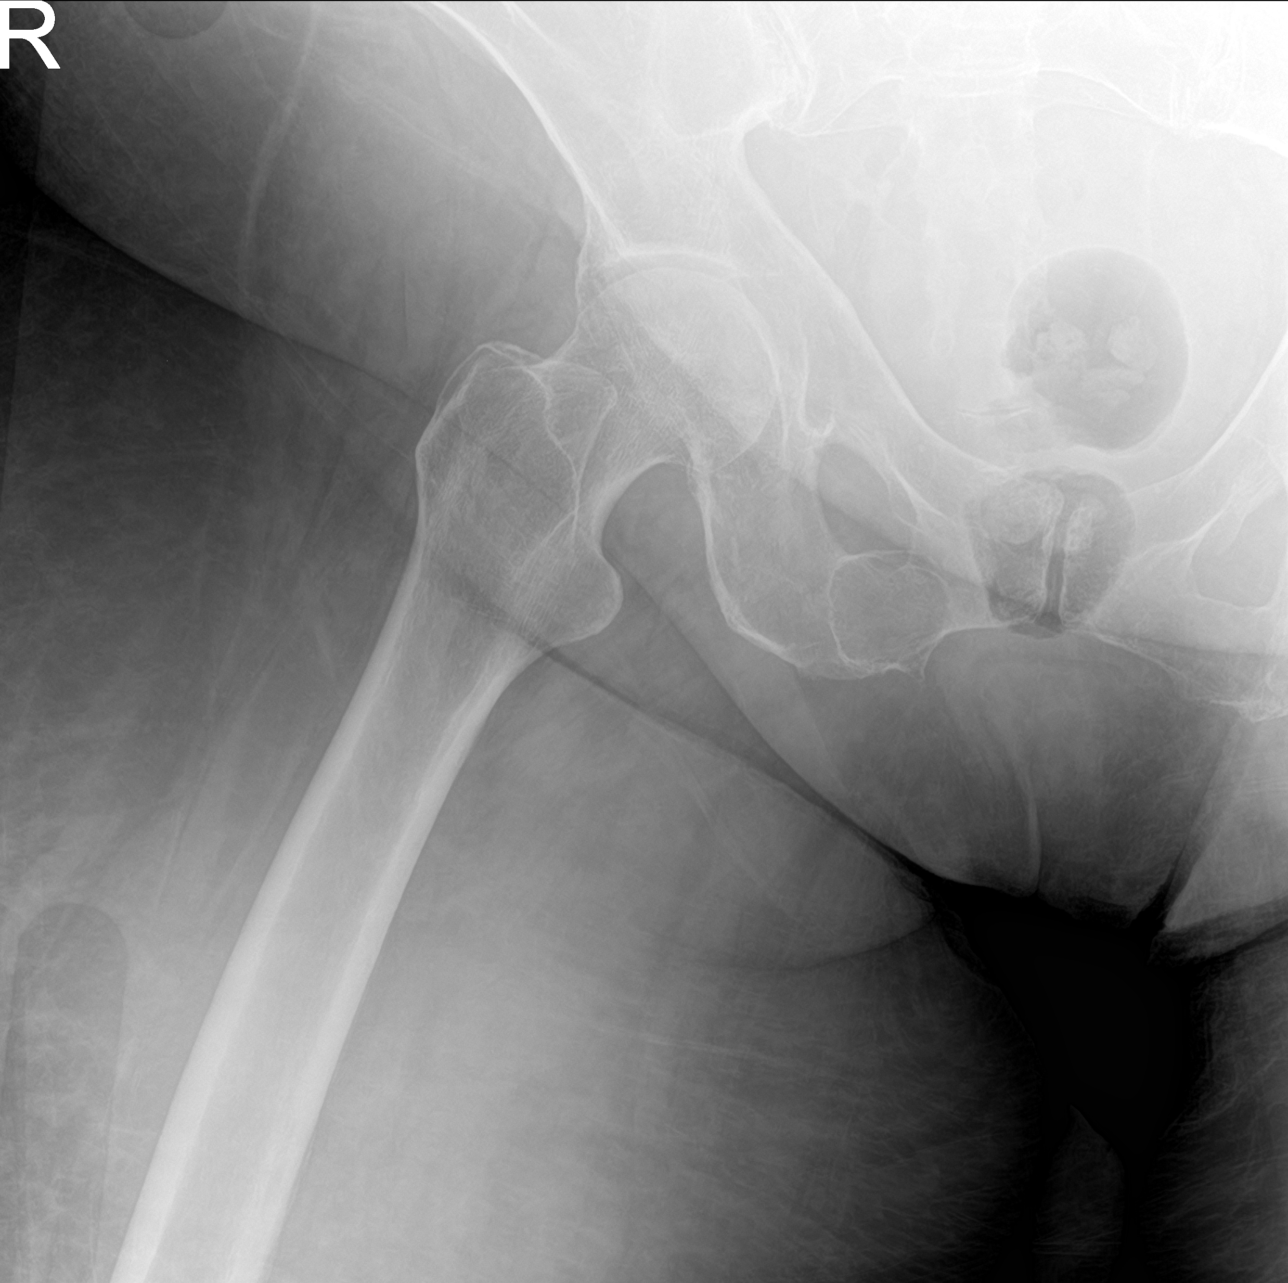

[3 of 3 positions shown; findings below may reference images not displayed]

FINDINGS: There is no acute fracture or dislocation. The bones are osteopenic
which limits evaluation for fracture. 50 the soft tissues are
grossly unremarkable.
IMPRESSION: No acute fracture or dislocation.

## 2017-08-17 IMAGING — CR DG CHEST 2V
2 series · 2 of 2 positions shown · non-contrast
Comparison: None.

CLINICAL DATA: Shortness of breath.

EXAM:
CHEST  2 VIEW

[chest lat]
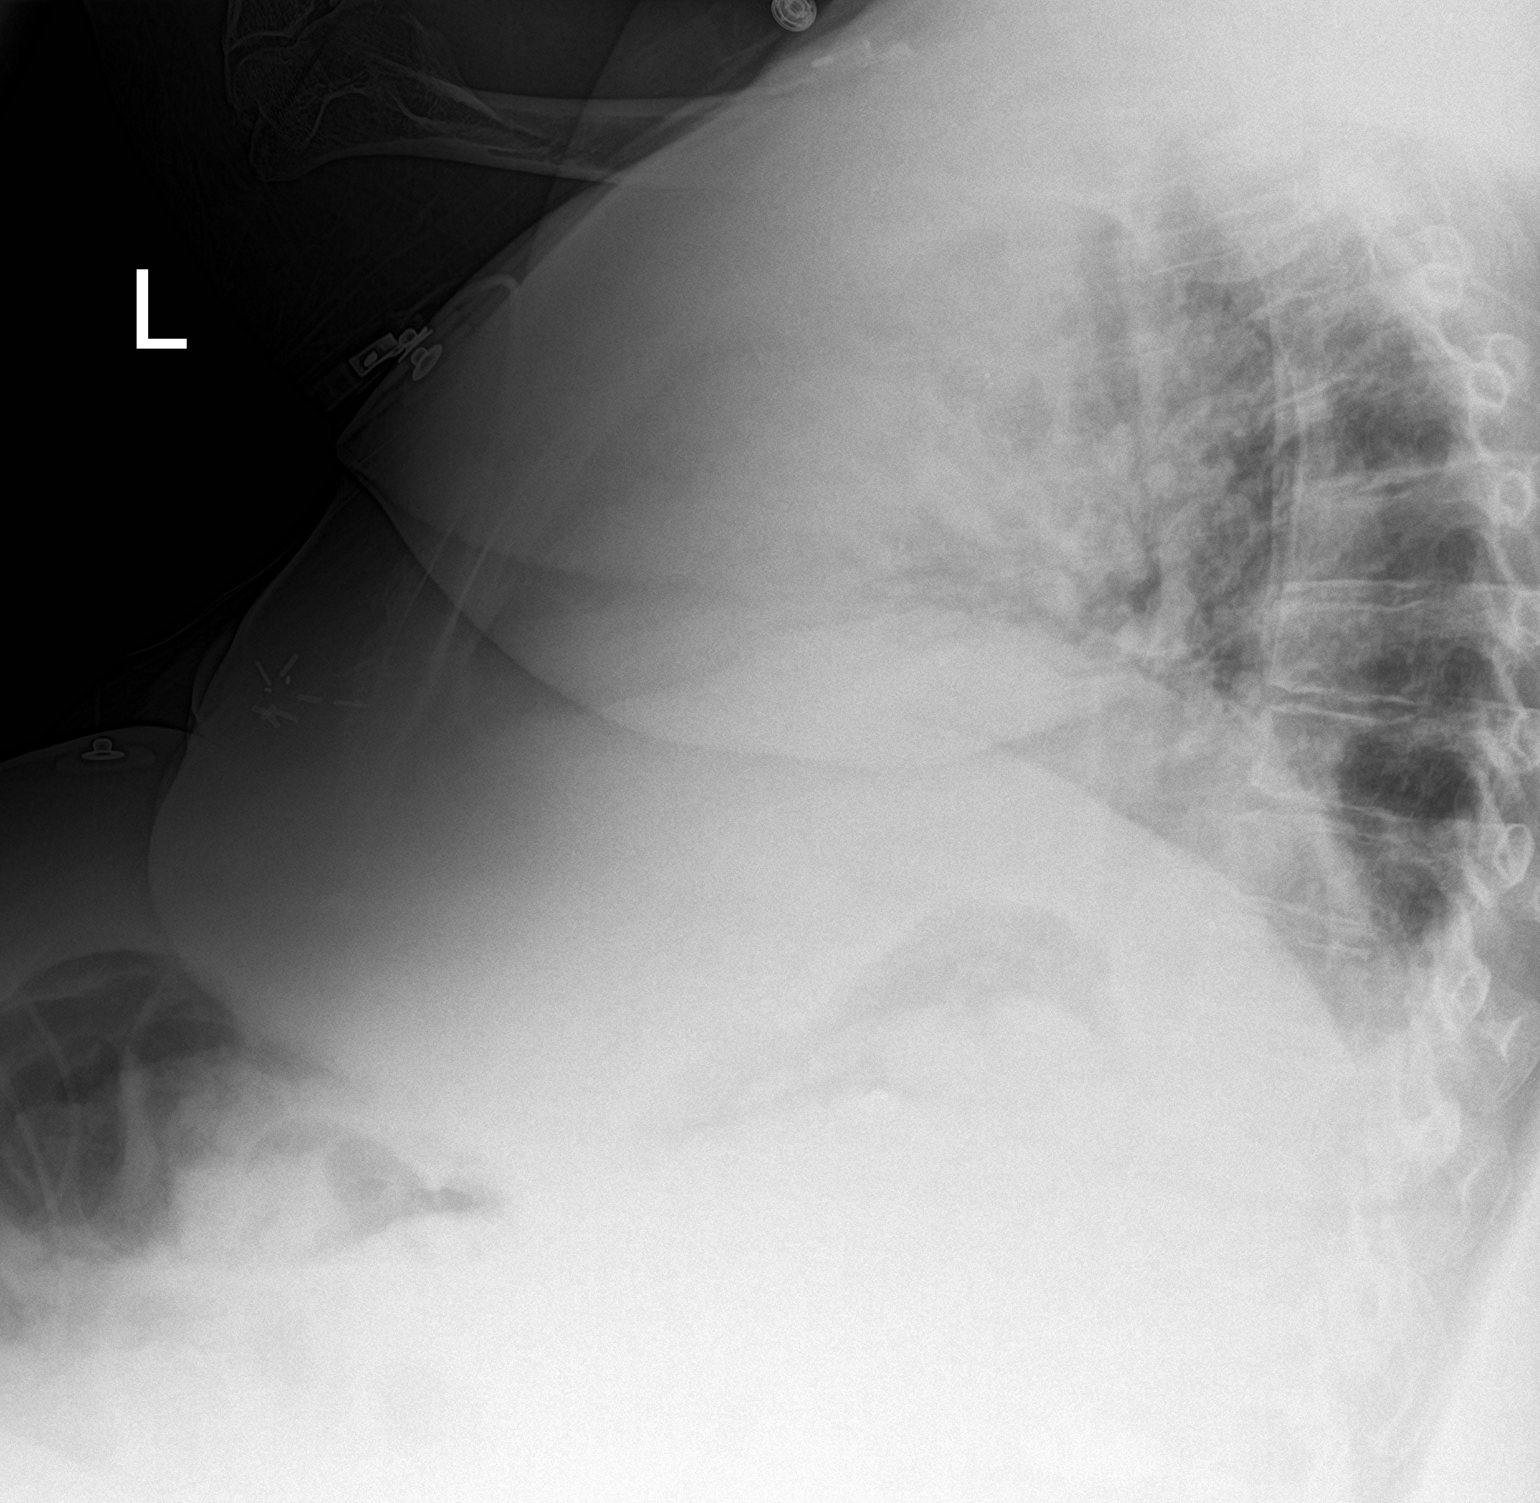

[chest ap]
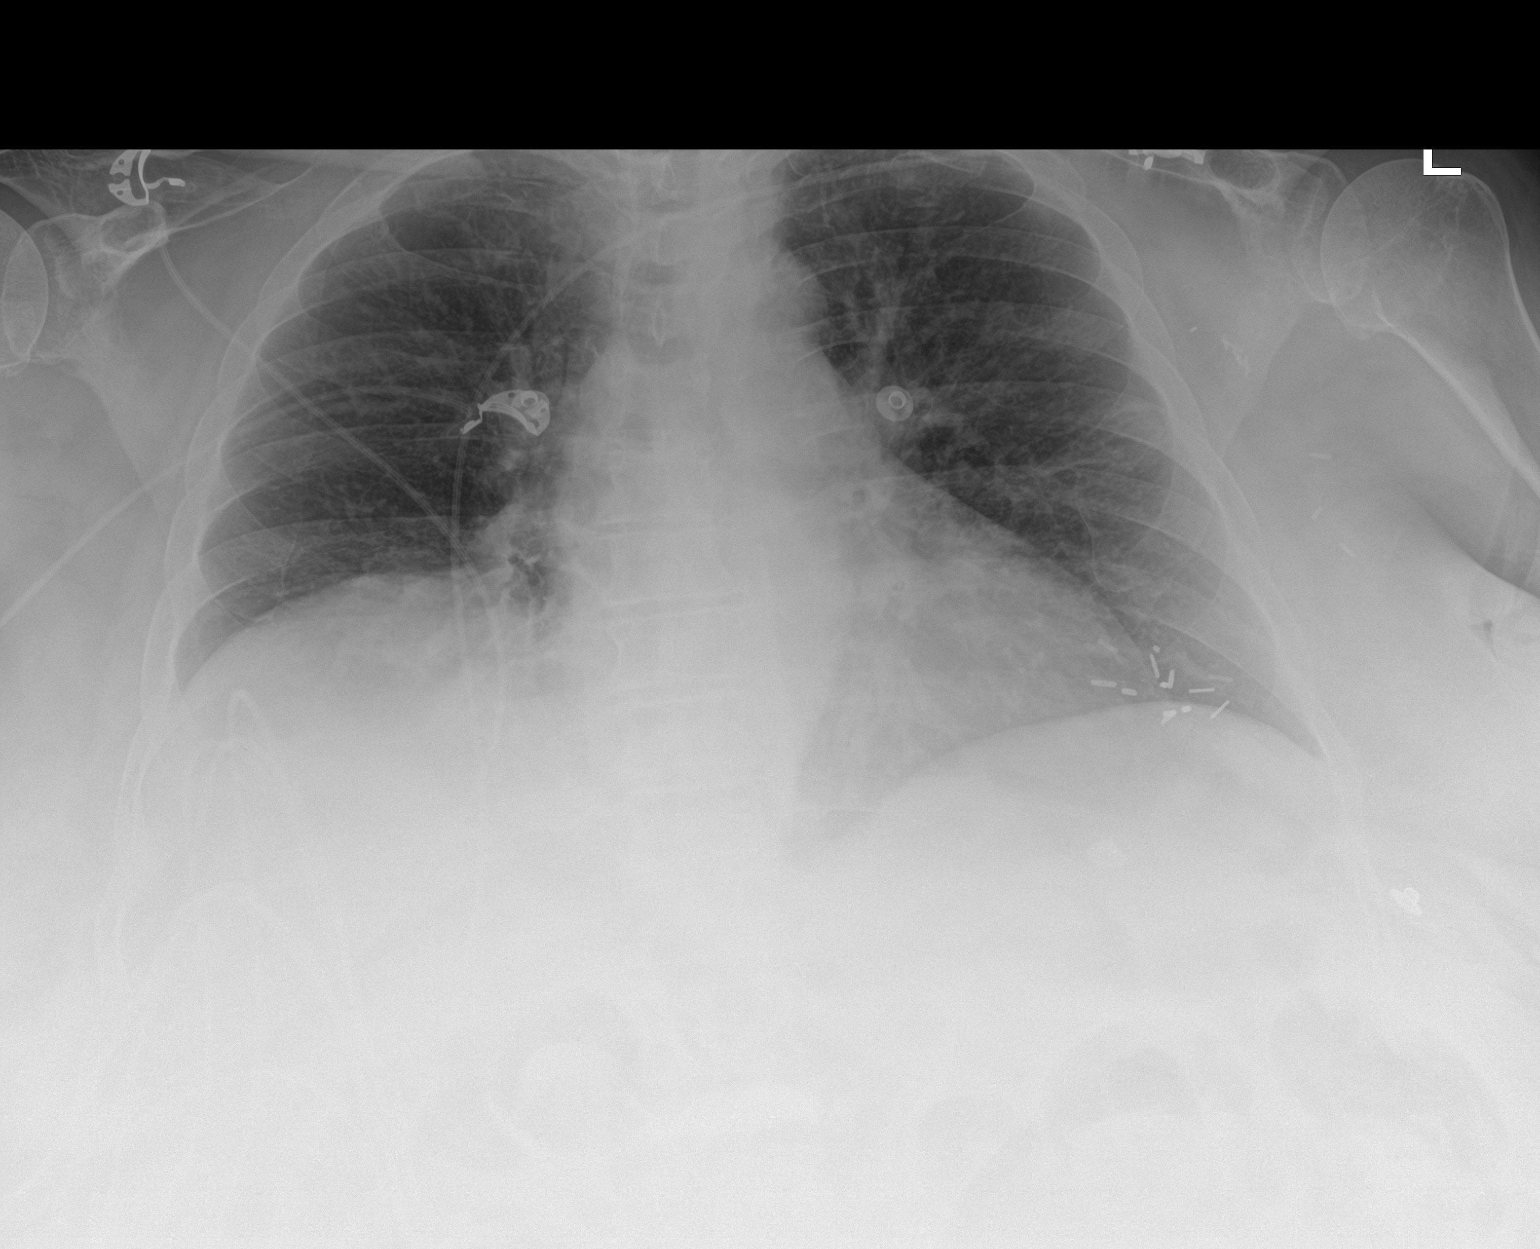

[2 of 2 positions shown; findings below may reference images not displayed]

FINDINGS: Cardiac size upper limits normal for degree of inspiration. Surgical
clips in the chest wall and axilla on the LEFT. No active
infiltrates or failure. Similar appearance to yesterday's
radiograph.
IMPRESSION: Poor inspiration.  No definite active process.
